# Patient Record
Sex: Male | Born: 1950 | Race: White | Hispanic: No | Marital: Married | Smoking: Never smoker
Health system: Southern US, Community
[De-identification: ages and names within clinical notes are randomized; demographics above are authoritative.]

## PROBLEM LIST (undated history)

## (undated) DIAGNOSIS — R030 Elevated blood-pressure reading, without diagnosis of hypertension: Secondary | ICD-10-CM

## (undated) DIAGNOSIS — M1990 Unspecified osteoarthritis, unspecified site: Secondary | ICD-10-CM

## (undated) DIAGNOSIS — E782 Mixed hyperlipidemia: Secondary | ICD-10-CM

## (undated) DIAGNOSIS — E7800 Pure hypercholesterolemia, unspecified: Secondary | ICD-10-CM

## (undated) DIAGNOSIS — I10 Essential (primary) hypertension: Secondary | ICD-10-CM

## (undated) DIAGNOSIS — H9190 Unspecified hearing loss, unspecified ear: Secondary | ICD-10-CM

## (undated) DIAGNOSIS — C61 Malignant neoplasm of prostate: Secondary | ICD-10-CM

## (undated) DIAGNOSIS — K219 Gastro-esophageal reflux disease without esophagitis: Secondary | ICD-10-CM

## (undated) DIAGNOSIS — C801 Malignant (primary) neoplasm, unspecified: Secondary | ICD-10-CM

## (undated) HISTORY — PX: NO PAST SURGERIES: SHX2092

## (undated) HISTORY — DX: Unspecified hearing loss, unspecified ear: H91.90

## (undated) HISTORY — DX: Malignant neoplasm of prostate: C61

## (undated) HISTORY — DX: Elevated blood-pressure reading, without diagnosis of hypertension: R03.0

## (undated) HISTORY — DX: Pure hypercholesterolemia, unspecified: E78.00

## (undated) HISTORY — DX: Essential (primary) hypertension: I10

## (undated) HISTORY — DX: Mixed hyperlipidemia: E78.2

---

## 2006-07-26 ENCOUNTER — Ambulatory Visit: Payer: Self-pay | Admitting: Gastroenterology

## 2008-02-03 LAB — HM COLONOSCOPY: HM COLON: NORMAL

## 2012-03-26 ENCOUNTER — Observation Stay: Payer: Self-pay | Admitting: Internal Medicine

## 2012-03-26 LAB — BASIC METABOLIC PANEL
Anion Gap: 6 — ABNORMAL LOW (ref 7–16)
Co2: 28 mmol/L (ref 21–32)
EGFR (Non-African Amer.): >60
Sodium: 136 mmol/L (ref 136–145)

## 2012-03-26 LAB — CBC
HGB: 16.8 g/dL (ref 13.0–18.0)
MCH: 31.2 pg (ref 26.0–34.0)
RBC: 5.37 10*6/uL (ref 4.40–5.90)
RDW: 13.4 % (ref 11.5–14.5)

## 2012-03-26 LAB — TROPONIN I
Troponin-I: <0.02 ng/mL
Troponin-I: <0.02 ng/mL

## 2012-03-26 LAB — CK TOTAL AND CKMB (NOT AT ARMC)
CK, Total: 177 U/L (ref 35–232)
CK-MB: 2.8 ng/mL (ref 0.5–3.6)

## 2012-03-26 LAB — HEMOGLOBIN A1C: Hemoglobin A1C: 5.9 % (ref 4.2–6.3)

## 2012-03-27 LAB — COMPREHENSIVE METABOLIC PANEL
Albumin: 3.3 g/dL — ABNORMAL LOW (ref 3.4–5.0)
Alkaline Phosphatase: 82 U/L (ref 50–136)
Anion Gap: 4 — ABNORMAL LOW (ref 7–16)
BUN: 18 mg/dL (ref 7–18)
Bilirubin,Total: 0.4 mg/dL (ref 0.2–1.0)
Co2: 28 mmol/L (ref 21–32)
EGFR (Non-African Amer.): >60
Glucose: 112 mg/dL — ABNORMAL HIGH (ref 65–99)
Potassium: 3.8 mmol/L (ref 3.5–5.1)
SGOT(AST): 30 U/L (ref 15–37)
SGPT (ALT): 30 U/L (ref 12–78)
Sodium: 137 mmol/L (ref 136–145)
Total Protein: 6.7 g/dL (ref 6.4–8.2)

## 2012-03-27 LAB — LIPID PANEL
Cholesterol: 213 mg/dL — ABNORMAL HIGH (ref 0–200)
HDL Cholesterol: 33 mg/dL — ABNORMAL LOW (ref 40–60)
Triglycerides: 613 mg/dL — ABNORMAL HIGH (ref 0–200)

## 2012-03-27 LAB — CBC WITH DIFFERENTIAL/PLATELET
Basophil #: 0.1 10*3/uL (ref 0.0–0.1)
Basophil %: 1.4 %
Eosinophil #: 0.6 10*3/uL (ref 0.0–0.7)
Eosinophil %: 6.9 %
HCT: 49.2 % (ref 40.0–52.0)
HGB: 16.2 g/dL (ref 13.0–18.0)
Lymphocyte %: 34.9 %
MCHC: 33 g/dL (ref 32.0–36.0)
MCV: 93 fL (ref 80–100)
Monocyte #: 0.9 x10 3/mm (ref 0.2–1.0)
Monocyte %: 11.1 %
Neutrophil %: 45.7 %
Platelet: 292 10*3/uL (ref 150–440)
RBC: 5.3 10*6/uL (ref 4.40–5.90)

## 2012-03-27 LAB — TROPONIN I: Troponin-I: <0.02 ng/mL

## 2012-07-01 ENCOUNTER — Ambulatory Visit: Payer: Self-pay | Admitting: Emergency Medicine

## 2012-07-01 LAB — DOT URINE DIP
Glucose,UR: NEGATIVE mg/dL (ref 0–75)
Protein: NEGATIVE
Specific Gravity: 1.025 (ref 1.003–1.030)

## 2013-03-13 DIAGNOSIS — E7800 Pure hypercholesterolemia, unspecified: Secondary | ICD-10-CM

## 2013-03-13 DIAGNOSIS — I10 Essential (primary) hypertension: Secondary | ICD-10-CM

## 2013-03-13 DIAGNOSIS — H9190 Unspecified hearing loss, unspecified ear: Secondary | ICD-10-CM | POA: Insufficient documentation

## 2013-03-13 HISTORY — DX: Unspecified hearing loss, unspecified ear: H91.90

## 2013-03-13 HISTORY — DX: Essential (primary) hypertension: I10

## 2013-03-13 HISTORY — DX: Pure hypercholesterolemia, unspecified: E78.00

## 2014-01-27 LAB — BASIC METABOLIC PANEL
BUN: 17 mg/dL (ref 4–21)
Creatinine: 0.9 mg/dL (ref <=1.3)
GLUCOSE: 109 mg/dL

## 2014-01-27 LAB — LIPID PANEL
CHOLESTEROL: 176 mg/dL (ref 0–200)
HDL: 35 mg/dL (ref 35–70)
LDL CALC: 78 mg/dL
Triglycerides: 316 mg/dL — AB (ref 40–160)

## 2014-01-27 LAB — FECAL OCCULT BLOOD, GUAIAC: FECAL OCCULT BLD: NEGATIVE

## 2014-01-27 LAB — PSA: PSA: 4.9

## 2014-04-24 NOTE — H&P (Signed)
PATIENT NAME:  Patrick Pena, Patrick Pena MR#:  [id number] DATE OF BIRTH:  04/28/1950  DATE OF ADMISSION:  03/26/2012  PRIMARY CARE PHYSICIAN: Otilio Miu, MD     HISTORY OF PRESENT ILLNESS: The patient is a 64 year old Caucasian male with past medical history significant for history of hyperlipidemia, diet controlled, who presented to the hospital with complaints of chest pains.  According to the patient, he was doing well up until approximately 10:00 a.m. when he was working in the garage, not a significantly strenuous job.  He suddenly started having left lower chest area pain close to the left axillary area which was radiating to the left arm.  The pain was described as 5 out of 10 by intensity, constant pain, lasted approximately 4  hours, accompanied with some headaches. The patient felt somewhat a little dizzy as well as nausea but denied any significant shortness of breath or feeling presyncopal. He never had such kind of pains in the past, and he decided to come to the Emergency Room for further evaluation. In the Emergency Room, his EKG was found to be abnormal with Q waves in V2 as well as T depressions in lateral leads, and hospitalist services were contacted for admission for patient with unstable angina.   PAST MEDICAL HISTORY: Significant for history of hyperlipidemia, diet-controlled.   MEDICATIONS: None.   PAST SURGICAL HISTORY: None.   ALLERGIES: None.   FAMILY HISTORY: Hypertension in the patient's father, who had CVA at the age of 90. No CVA or coronary artery disease at an early age.  No diabetes or cancers in the family.  The patient's mother had dementia.   SOCIAL HISTORY: The patient is married, has 2 children who live close by. No smoking.  Drinks alcohol, bourbon, 10 to 12 ounces a week.   REVIEW OF SYSTEMS: Positive for pains in the chest, some snoring, actually significant snoring as the patient's family reports significant snoring as well as stopping breathing at nighttime as well  as sleepiness in daytime. Intermittent cough with no sputum production. Nausea earlier today. Otherwise:  CONSTITUTIONAL: Denies any fevers, chills, fatigue, weakness, weight loss or gain.  EYES: Denies any blurry vision or double vision, glaucoma or cataracts.  ENT: Denies any tinnitus, allergies, epistaxis, sinus pain, dentures, difficulty swallowing.  RESPIRATORY: Denies any wheezes, asthma or COPD.  CARDIOVASCULAR: Denies any orthopnea, edema, arrhythmias, palpitations or syncope. GASTROINTESTINAL: Denies any vomiting, diarrhea or constipation.  GENITOURINARY: Denies any dysuria, hematuria, frequency or incontinence. ENDOCRINOLOGY: Denies any polydipsia, nocturia, thyroid problems, heat or cold intolerance or thirst.  HEMATOLOGIC: Denies anemia, easy bruising, bleeding or swollen glands.  SKIN: Denies any acne, rashes, lesions or change in moles.  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  Otherwise, no cramps, swelling or gout.  NEUROLOGIC: Denies numbness, epilepsy or tremor.  PSYCHIATRIC: Denies anxiety, insomnia, or depression.    PHYSICAL EXAMINATION:  VITAL SIGNS: On arrival to the hospital, temperature was 98.1, pulse was 74, respiratory rate was 20, blood pressure 178/84, saturation 98% on room air.  GENERAL:  This is a well-developed, well-nourished Caucasian male in no significant distress lying on the stretcher.  He is somewhat flushed in his face but otherwise comfortable on the stretcher.   HEENT: His pupils are equal and reactive to light. Extraocular movements are intact. No icterus or conjunctivitis.  Has normal hearing.  No pharyngeal erythema. Mucosa is moist.  NECK: No masses, supple, nontender. Thyroid is not  enlarged. No adenopathy. No JVD or carotid bruits bilaterally.  Full range of motion.  LUNGS: Clear to auscultation in all fields. Somewhat diminished breath sounds but no wheezing, no rales or  labored inspirations or increased effort to breath.  No dullness to percussion.  Not in overt respiratory distress.  CARDIOVASCULAR: S1, S2 appreciated. No murmurs, gallops or rubs noted. Rhythm and rate is regular.  PMI is not lateralized.  Chest is nontender to palpation, 1+ pedal pulses.  No lower extremity edema. No calf tenderness or cyanosis noted.  ABDOMEN:  Soft, nontender.  Bowel sounds are present.  No hepatosplenomegaly or masses were noted.  MUSCULOSKELETAL:  Muscle strength: Able to move all extremities. No cyanosis, degenerative joint disease or kyphosis. Gait is not tested.   SKIN: Skin did not reveal any rashes, lesions, erythema, nodularity, induration. It was warm and dry to palpation.  LYMPH: No adenopathy in the cervical region.  NEUROLOGICAL: Cranial nerves grossly intact. Sensory is intact. No dysarthria or aphasia. The patient is alert and oriented to time, person and place, cooperative. Memory is good.  PSYCHIATRIC: No significant confusion, agitation or depression noted.   LABORATORY, DIAGNOSTIC AND RADIOLOGICAL DATA:  BMP showed glucose of 106, otherwise unremarkable. Troponin is less than 0.2. CBC within normal limits with white blood cell count 9.3, hemoglobin 16.8, platelet count 325.   EKG showed normal sinus rhythm at 72 beats per minute, normal axis. Septal infarct, age undetermined, with Q waves in V2.  Otherwise, good R wave progression.  T depressions in inferior leads were also noted.  No EKG to compare with.   Chest, PA and lateral on 03/26/2012, revealed no acute cardiopulmonary disease. Some prominence in the right superior mediastinum was felt to be secondary to tortuous great vessels and patient positioning.  However, follow-up PA and lateral x-rays were recommended to ensure stability. CT scan of head without contrast due to left facial numbness on 03/26/2012 revealed no evidence of acute intracranial abnormalities. Findings which may represent a component of  mild sinus disease were noted.   ASSESSMENT AND PLAN: 1. Unstable angina:  Admit the patient to the medical floor.  We will start the patient on full anticoagulation with Lovenox. We will continue metoprolol, as well as nitroglycerin, as well as aspirin.  We will get Myoview stress test in the morning.  2. Malignant hypertension:  Continue the patient on nitroglycerin as well as metoprolol while in the hospital and also lisinopril.  3. Hyperlipidemia: Get lipid panel in the morning.  4. Hyperglycemia: Check hemoglobin A1c.  5. Suspected obstructive sleep apnea: The patient needs a sleep study to be done as outpatient.  6. Questionable transient ischemic attack as apparently the patient was complaining of some left facial weakness, headaches as well as left facial numbness.  We will continue the patient on aspirin therapy and will get a carotid ultrasound as well as echocardiogram done.        TIME SPENT: 1 hour.    ____________________________ Theodoro Grist, MD rv:cb D: 03/26/2012 17:18:49 ET T: 03/26/2012 17:44:12 ET JOB#: [id number]  cc: Theodoro Grist, MD, <Dictator> Juline Patch, MD [place] MD ELECTRONICALLY SIGNED 04/04/2012 15:13

## 2014-04-24 NOTE — Discharge Summary (Signed)
PATIENT NAME:  Patrick Pena, Patrick Pena MR#:  [id number] DATE OF BIRTH:  1950-05-03  DATE OF ADMISSION:  03/26/2012 DATE OF DISCHARGE:  03/27/2012  PRIMARY CARE PHYSICIAN: Dr. Otilio Miu  DISCHARGE DIAGNOSES: 1.  Chest pain, no acute coronary syndrome.    2.  Hypertension malignancy.  3.  Hyperlipidemia.  CODE STATUS: Full code.   CONDITION: Stable.   HOME MEDICATIONS: Vitamine 1 tablet p.o. daily, lisinopril 5 mg p.o. daily, Lopressor 25 mg p.o. b.i.d. aspirin 81 mg p.o. daily, atorvastatin 10 mg p.o. at bedtime.   DIET: Low sodium.   ACTIVITY: As tolerated.  FOLLOW-UP CARE: Follow up with PCP within 1 to 2 weeks.   REASON FOR ADMISSION: Chest pain.   HOSPITAL COURSE: The patient is a 64 year old Caucasian male with a history of hyperlipidemia, who presented to the ED with chest pain on the left side 5 out of 10, constant, lasted about 4 hours, accompanied with some headache. The patient's blood pressure was high  in ED at 178/84. For detailed history and physical examination, please refer to the admission note dictated by Dr. Theodoro Grist. The patient was admitted for unstable angina. After admission, the patient has been treated with Lovenox 1 mg/kg q. 12 hours and in addition, the patient has been treated with Lopressor, lisinopril, aspirin and nitroglycerin. Stress test was done today, which was negative. The patient's blood pressure has been controlled with hypertension medication. The patient's lipid panel shows hyperlipidemia, which has been treated with Lipitor. The patient denies any facial numbness anytime. He has no sign of her transient ischemic attack. The patient has no complaints after admission. He is clinically stable and will be discharged home today. The patient's discharge plan discussed with the patient, patient's wife and case Freight forwarder.   TIME SPENT: About 33 minutes.    ____________________________ Demetrios Loll, MD qc:cc D: 03/27/2012 18:07:55 ET T: 03/27/2012 18:39:35  ET JOB#: [id number]  cc: Demetrios Loll, MD, <Dictator> Demetrios Loll MD ELECTRONICALLY SIGNED 03/28/2012 15:29

## 2014-05-12 DIAGNOSIS — IMO0001 Reserved for inherently not codable concepts without codable children: Secondary | ICD-10-CM

## 2014-05-12 DIAGNOSIS — E782 Mixed hyperlipidemia: Secondary | ICD-10-CM | POA: Insufficient documentation

## 2014-05-12 DIAGNOSIS — R030 Elevated blood-pressure reading, without diagnosis of hypertension: Secondary | ICD-10-CM

## 2014-05-12 HISTORY — DX: Mixed hyperlipidemia: E78.2

## 2014-05-12 HISTORY — DX: Reserved for inherently not codable concepts without codable children: IMO0001

## 2014-05-22 ENCOUNTER — Ambulatory Visit
Admission: EM | Admit: 2014-05-22 | Discharge: 2014-05-22 | Disposition: A | Discharge status: Home / Self Care | Payer: BC Managed Care – PPO | Attending: Family Medicine | Admitting: Family Medicine

## 2014-05-22 ENCOUNTER — Encounter: Payer: Self-pay | Admitting: Emergency Medicine

## 2014-05-22 DIAGNOSIS — Z0289 Encounter for other administrative examinations: Secondary | ICD-10-CM

## 2014-05-22 DIAGNOSIS — Z029 Encounter for administrative examinations, unspecified: Secondary | ICD-10-CM

## 2014-05-22 HISTORY — DX: Pure hypercholesterolemia, unspecified: E78.00

## 2014-05-22 HISTORY — DX: Essential (primary) hypertension: I10

## 2014-05-22 LAB — DEPT OF TRANSP DIPSTICK, URINE (ARMC ONLY)
GLUCOSE, UA: NEGATIVE mg/dL
Hgb urine dipstick: NEGATIVE
Protein, ur: NEGATIVE mg/dL
SPECIFIC GRAVITY, URINE: 1.025 (ref 1.005–1.030)

## 2014-05-22 NOTE — ED Provider Notes (Signed)
CSN: [id number]     Arrival date & time 05/22/14  0706 History   First MD Initiated Contact with Patient 05/22/14 [phone]     Chief Complaint  Patient presents with  . DOT Physical    HPI Comments: Patient here for DOT Physical (see scanned form)  The history is provided by the patient.     Past Medical History  Diagnosis Date  . Hypertension   . Hypercholesteremia    History reviewed. No pertinent past surgical history. History reviewed. No pertinent family history. History  Substance Use Topics  . Smoking status: Never Smoker   . Smokeless tobacco: Never Used  . Alcohol Use: Yes    Review of Systems  Allergies  Review of patient's allergies indicates no known allergies.  Home Medications   Prior to Admission medications   Medication Sig Start Date End Date Taking? Authorizing Provider  aspirin 81 MG tablet Take 1 tablet by mouth daily. 03/28/12   Historical Provider, MD  atorvastatin (LIPITOR) 10 MG tablet Take 1 tablet by mouth daily. 01/28/14   Historical Provider, MD  lisinopril (PRINIVIL,ZESTRIL) 5 MG tablet Take 1 tablet by mouth daily. 01/27/14   Historical Provider, MD  metoprolol tartrate (LOPRESSOR) 25 MG tablet Take 0.5 tablets by mouth 2 (two) times daily. 01/27/14   Historical Provider, MD   BP 122/70 mmHg  Pulse 60  Temp(Src) 97 F (36.1 C) (Tympanic)  Resp 16  Ht 5' 8.5" (1.74 m)  Wt 184 lb (83.462 kg)  BMI 27.57 kg/m2  SpO2 98% Physical Exam  ED Course  Procedures (including critical care time) Labs Review Labs Reviewed  DEPT OF TRANSP DIPSTICK, URINE([facility])     Imaging Review No results found.   MDM   1. Encounter for examination required by Department of Transportation (DOT)      DOT Physical (see scanned form; qualified one year)  Norval Gable, MD 05/22/14 0830

## 2014-05-22 NOTE — ED Notes (Signed)
Patient here for DOT Physical.  

## 2014-07-28 ENCOUNTER — Ambulatory Visit: Payer: Self-pay | Admitting: Family Medicine

## 2014-11-04 ENCOUNTER — Other Ambulatory Visit: Payer: Self-pay | Admitting: Family Medicine

## 2014-11-19 ENCOUNTER — Ambulatory Visit (INDEPENDENT_AMBULATORY_CARE_PROVIDER_SITE_OTHER): Payer: BC Managed Care – PPO | Admitting: Urology

## 2014-11-19 VITALS — BP 119/73 | HR 60 | Ht 68.5 in | Wt 182.5 lb

## 2014-11-19 DIAGNOSIS — R972 Elevated prostate specific antigen [PSA]: Secondary | ICD-10-CM

## 2014-11-19 NOTE — Progress Notes (Signed)
11/19/2014 8:55 AM   Patrick Pena 1950-05-13 [id number]  Referring provider: Sharyne Peach, MD [place], [place] [address]  Chief Complaint  Patient presents with  . Elevated PSA    New Patient    HPI: The patient is a 64 year old healthy male with no GU history presents for elevated PSA. He had a PSA of 4.9 in January 2016 and 5.54 in August 2016. He has no urologic complaints at this time. His I PSS scores 8/2. He does have nocturia 2 does not find this bothersome. He is able to obtain an erection suitable for intercourse until completion.    PMH: Past Medical History  Diagnosis Date  . Hypertension   . Hypercholesteremia   . BP (high blood pressure) 03/13/2013  . Blood pressure elevated 05/12/2014  . 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 Hypercholesterolemia 03/13/2013    Surgical History: Past Surgical History  Procedure Laterality Date  . No past surgeries      Home Medications:    Medication List       This list is accurate as of: 11/19/14  8:55 AM.  Always use your most recent med list.               aspirin 81 MG tablet  Take 1 tablet by mouth daily.     lisinopril 5 MG tablet  Commonly known as:  PRINIVIL,ZESTRIL  TAKE 1 TABLET BY MOUTH DAILY     metoprolol tartrate 25 MG tablet  Commonly known as:  LOPRESSOR  TAKE 1/2 TABLET BY MOUTH TWICE A DAY     ranitidine 300 MG capsule  Commonly known as:  ZANTAC  Take 300 mg by mouth every evening.     ranitidine 300 MG tablet  Commonly known as:  ZANTAC  TAKE 1 TABLET (300 MG TOTAL) BY MOUTH NIGHTLY.        Allergies:  Allergies  Allergen Reactions  . Atorvastatin     Other reaction(s): Headache, Muscle Pain    Family History: Family History  Problem Relation Age of Onset  . Prostate cancer Neg Hx   . Kidney cancer Neg Hx   . Tuberculosis Neg Hx     Social History:  reports that he has never  smoked. He has never used smokeless tobacco. He reports that he drinks alcohol. He reports that he does not use illicit drugs.  ROS: UROLOGY Frequent Urination?: No Hard to postpone urination?: No Burning/pain with urination?: No Get up at night to urinate?: Yes Leakage of urine?: No Urine stream starts and stops?: No Trouble starting stream?: No Do you have to strain to urinate?: No Blood in urine?: No Urinary tract infection?: No Sexually transmitted disease?: No Injury to kidneys or bladder?: No Painful intercourse?: No Weak stream?: Yes Erection problems?: Yes Penile pain?: No  Gastrointestinal Nausea?: No Vomiting?: No Indigestion/heartburn?: No Diarrhea?: No Constipation?: No  Constitutional Fever: No Night sweats?: No Weight loss?: No Fatigue?: No  Skin Skin rash/lesions?: No Itching?: No  Eyes Blurred vision?: No Double vision?: No  Ears/Nose/Throat Sore throat?: No Sinus problems?: No  Hematologic/Lymphatic Swollen glands?: No Easy bruising?: No  Cardiovascular Leg swelling?: No Chest pain?: No  Respiratory Cough?: No Shortness of breath?: No  Endocrine Excessive thirst?: No  Musculoskeletal Back pain?: No Joint pain?: No  Neurological Headaches?: No Dizziness?: No  Psychologic Depression?: No Anxiety?: No  Physical Exam: BP 119/73 mmHg  Pulse 60  Ht 5' 8.5" (1.74 m)  Wt 182 lb 8 oz (82.781 kg)  BMI 27.34 kg/m2  Constitutional:  Alert and oriented, No acute distress. HEENT: [place] AT, moist mucus membranes.  Trachea midline, no masses. Cardiovascular: No clubbing, cyanosis, or edema. Respiratory: Normal respiratory effort, no increased work of breathing. GI: Abdomen is soft, nontender, nondistended, no abdominal masses GU: No CVA tenderness. Normal phallus. Testicles descended equally bilaterally. DRE: 2+, smooth with induration of the right side. Skin: No rashes, bruises or suspicious lesions. Lymph: No cervical or inguinal  adenopathy. Neurologic: Grossly intact, no focal deficits, moving all 4 extremities. Psychiatric: Normal mood and affect.  Laboratory Data: Lab Results  Component Value Date   WBC 8.5 03/27/2012   HGB 16.2 03/27/2012   HCT 49.2 03/27/2012   MCV 93 03/27/2012   PLT 292 03/27/2012    Lab Results  Component Value Date   CREATININE 0.9 01/27/2014    Lab Results  Component Value Date   PSA 4.9 01/27/2014    No results found for: TESTOSTERONE  Lab Results  Component Value Date   HGBA1C 5.9 03/26/2012    Urinalysis    Component Value Date/Time   LABSPEC 1.025 05/22/2014 0746   LABSPEC 1.025 07/01/2012 1444   GLUCOSEU NEGATIVE 05/22/2014 0746   GLUCOSEU NEGATIVE 07/01/2012 1444   HGBUR NEGATIVE 05/22/2014 0746   HGBUR NEGATIVE 07/01/2012 1444   PROTEINUR NEGATIVE 05/22/2014 0746   PROTEINUR NEGATIVE 07/01/2012 1444      Assessment & Plan:     We reviewed the implications of an elevated PSA and the uncertainty surrounding it. In general, a man's PSA increases with age and is produced by both normal and cancerous prostate tissue. Differential for elevated PSA is BPH, prostate cancer, infection, recent intercourse/ejaculation, prostate infarction, recent urethroscopic manipulation (foley placement/cystoscopy) and prostatitis. Management of an elevated PSA can include observation or prostate biopsy and we discussed this in detail. We discussed that indications for prostate biopsy are defined by age and race specific PSA cutoffs as well as a PSA velocity of 0.75/year.  I also discussed with the patient that he is at an increased risk for prostate cancer due to his abnormal digital rectal exam.   We discussed prostate biopsy in detail including the procedure itself, the risks of blood in the urine, stool, and ejaculate, serious infection, and discomfort. He is willing to proceed with this as discussed.   1. Elevated PSA with abnormal DRE -return for prostate biopsy  Return  in about 2 weeks (around 12/03/2014) for prostate biopsy.  Nickie Retort, MD  [facility] [address], [place], [place] [address] [phone]

## 2014-11-20 LAB — PSA: PROSTATE SPECIFIC AG, SERUM: 5.4 ng/mL — AB (ref 0.0–4.0)

## 2014-12-17 ENCOUNTER — Ambulatory Visit (INDEPENDENT_AMBULATORY_CARE_PROVIDER_SITE_OTHER): Payer: BC Managed Care – PPO | Admitting: Urology

## 2014-12-17 ENCOUNTER — Encounter: Payer: Self-pay | Admitting: Urology

## 2014-12-17 ENCOUNTER — Other Ambulatory Visit: Payer: Self-pay | Admitting: Urology

## 2014-12-17 VITALS — BP 123/77 | HR 57 | Ht 68.5 in | Wt 184.0 lb

## 2014-12-17 DIAGNOSIS — R972 Elevated prostate specific antigen [PSA]: Secondary | ICD-10-CM

## 2014-12-17 MED ORDER — GENTAMICIN SULFATE 40 MG/ML IJ SOLN
80.0000 mg | Freq: Once | INTRAMUSCULAR | Status: AC
  Administered 2014-12-17: 80 mg via INTRAMUSCULAR

## 2014-12-17 MED ORDER — LIDOCAINE HCL 2 % EX GEL
1.0000 "application " | Freq: Once | CUTANEOUS | Status: AC
  Administered 2014-12-17: 1 via URETHRAL

## 2014-12-17 MED ORDER — LEVOFLOXACIN 500 MG PO TABS
500.0000 mg | ORAL_TABLET | Freq: Once | ORAL | Status: AC
  Administered 2014-12-17: 500 mg via ORAL

## 2014-12-17 NOTE — Progress Notes (Signed)
Prostate Biopsy Procedure   Informed consent was obtained after discussing risks/benefits of the procedure.  A time out was performed to ensure correct patient identity.  Pre-Procedure: - Last PSA Level:  Lab Results  Component Value Date   PSA 5.4* 11/19/2014   PSA 4.9 01/27/2014   - Gentamicin given prophylactically - Levaquin 500 mg administered PO/Gentamycin 80 mg IM -Transrectal Ultrasound performed revealing a 59.09 gm prostate -No significant hypoechoic or median lobe noted  Procedure: - Prostate block performed using 10 cc 1% lidocaine and biopsies taken from sextant areas, a total of 12 under ultrasound guidance.  Post-Procedure: - Patient tolerated the procedure well - He was counseled to seek immediate medical attention if experiences any severe pain, significant bleeding, or fevers - Return in one week to discuss biopsy results

## 2014-12-22 LAB — PATHOLOGY REPORT

## 2014-12-24 ENCOUNTER — Ambulatory Visit (INDEPENDENT_AMBULATORY_CARE_PROVIDER_SITE_OTHER): Payer: BC Managed Care – PPO | Admitting: Urology

## 2014-12-24 ENCOUNTER — Encounter: Payer: Self-pay | Admitting: Urology

## 2014-12-24 ENCOUNTER — Ambulatory Visit: Payer: BC Managed Care – PPO

## 2014-12-24 VITALS — BP 130/72 | HR 61 | Ht 69.0 in | Wt 185.0 lb

## 2014-12-24 DIAGNOSIS — C61 Malignant neoplasm of prostate: Secondary | ICD-10-CM

## 2014-12-24 NOTE — Progress Notes (Signed)
12/24/2014 11:38 AM   Patrick Pena May 03, 1950 [id number]  Referring provider: Juline Patch, MD [address] [place], [place] [address]  Chief Complaint  Patient presents with  . Results    Biopsy    HPI: The patient is a 64 year old healthy male with no GU history presents for elevated PSA. He had a PSA of 4.9 in January 2016 and 5.54 in August 2016. He has no urologic complaints at this time. His I PSS scores 8/2. He does have nocturia 2 but does not find this bothersome. He is able to obtain an erection suitable for intercourse until completion.   Interval history: The patient follows up for his biopsy results. Unfortunately, he had Gleason 4+4 = 8 in 13% of the core from his left lateral base. He also had multiple cores bilaterally Gleason 3+3 = 6 prostate cancer. These were athe right medial base (50%), left medial mid (41%), and right medial apex (5%).  PMH: Past Medical History  Diagnosis Date  . Hypertension   . Hypercholesteremia   . BP (high blood pressure) 03/13/2013  . Blood pressure elevated 05/12/2014  . 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 Hypercholesterolemia 03/13/2013    Surgical History: Past Surgical History  Procedure Laterality Date  . No past surgeries      Home Medications:    Medication List       This list is accurate as of: 12/24/14 11:38 AM.  Always use your most recent med list.               aspirin 81 MG tablet  Take 1 tablet by mouth daily. Reported on 12/24/2014     lisinopril 5 MG tablet  Commonly known as:  PRINIVIL,ZESTRIL  TAKE 1 TABLET BY MOUTH DAILY     metoprolol tartrate 25 MG tablet  Commonly known as:  LOPRESSOR  TAKE 1/2 TABLET BY MOUTH TWICE A DAY     multivitamin tablet  Take 1 tablet by mouth daily.     ranitidine 300 MG capsule  Commonly known as:  ZANTAC  Take 300 mg by mouth every evening.     ranitidine 300 MG tablet    Commonly known as:  ZANTAC  Reported on 12/24/2014        Allergies:  Allergies  Allergen Reactions  . Atorvastatin     Other reaction(s): Headache, Muscle Pain Other reaction(s): Headache, Muscle Pain    Family History: Family History  Problem Relation Age of Onset  . Prostate cancer Neg Hx   . Kidney cancer Neg Hx   . Tuberculosis Neg Hx     Social History:  reports that he has never smoked. He has never used smokeless tobacco. He reports that he drinks alcohol. He reports that he does not use illicit drugs.  ROS:                                        Physical Exam: BP 130/72 mmHg  Pulse 61  Ht 5\' 9"  (1.753 m)  Wt 185 lb (83.915 kg)  BMI 27.31 kg/m2  Constitutional:  Alert and oriented, No acute distress. HEENT:  AT, moist mucus membranes.  Trachea midline, no masses. Cardiovascular: No clubbing, cyanosis, or edema. Respiratory: Normal respiratory effort, no increased work of breathing. GI: Abdomen is soft, nontender, nondistended, no  abdominal masses GU: No CVA tenderness.  Skin: No rashes, bruises or suspicious lesions. Lymph: No cervical or inguinal adenopathy. Neurologic: Grossly intact, no focal deficits, moving all 4 extremities. Psychiatric: Normal mood and affect.  Laboratory Data: Lab Results  Component Value Date   WBC 8.5 03/27/2012   HGB 16.2 03/27/2012   HCT 49.2 03/27/2012   MCV 93 03/27/2012   PLT 292 03/27/2012    Lab Results  Component Value Date   CREATININE 0.9 01/27/2014    Lab Results  Component Value Date   PSA 5.4* 11/19/2014   PSA 4.9 01/27/2014    No results found for: TESTOSTERONE  Lab Results  Component Value Date   HGBA1C 5.9 03/26/2012    Urinalysis    Component Value Date/Time   LABSPEC 1.025 05/22/2014 0746   LABSPEC 1.025 07/01/2012 1444   GLUCOSEU NEGATIVE 05/22/2014 0746   GLUCOSEU NEGATIVE 07/01/2012 1444   HGBUR NEGATIVE 05/22/2014 0746   HGBUR NEGATIVE 07/01/2012 1444    PROTEINUR NEGATIVE 05/22/2014 0746   PROTEINUR NEGATIVE 07/01/2012 1444      Assessment & Plan:     I discussed with the patient his high risk prostate cancer due to his core of gleason 4+4=8 disease.  We discussed the treatment options for prostate cancer which include watchful waiting, active surveillance, robotic prostatectomy, radiation therapy, and cryotherapy. I do not think he is a good candidate for watchful waiting, active surveillance, cryotherapy due to his high risk disease. We discussed the robotic prostatectomy in great detail including the need for Foley catheterization after surgery. He is also the risk of incontinence and impotence following surgery. We discussed that he would need to refrain from heavy lifting of anything weighing greater than a gallon of milk for 4-6 weeks after surgery. We also discussed radiation therapy and some of the side effects of this treatment option. I think he would best served given his Gleason 8 prostate cancer with the robotic prostatectomy.  He was offered a consultation with radiation oncology if he so desires. He will think about his options at this time. He was given the 100 questions about prostate cancer book. He will follow-up after high-risk imaging studies are obtained.  1. 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    Return in about 2 weeks (around 01/07/2015) for after imaging studies.  Nickie Retort, MD  [facility] [address] [place], [place] [address] [phone]

## 2014-12-25 LAB — BASIC METABOLIC PANEL
BUN/Creatinine Ratio: 23 — ABNORMAL HIGH (ref 10–22)
BUN: 18 mg/dL (ref 8–27)
CHLORIDE: 96 mmol/L (ref 96–106)
CO2: 23 mmol/L (ref 18–29)
Calcium: 9.8 mg/dL (ref 8.6–10.2)
Creatinine, Ser: 0.8 mg/dL (ref 0.76–1.27)
GFR calc Af Amer: 109 mL/min/{1.73_m2} (ref >59)
GFR calc non Af Amer: 94 mL/min/{1.73_m2} (ref >59)
GLUCOSE: 103 mg/dL — AB (ref 65–99)
Potassium: 4.9 mmol/L (ref 3.5–5.2)
SODIUM: 139 mmol/L (ref 134–144)

## 2014-12-31 DIAGNOSIS — C61 Malignant neoplasm of prostate: Secondary | ICD-10-CM | POA: Insufficient documentation

## 2015-01-05 ENCOUNTER — Encounter
Admission: RE | Admit: 2015-01-05 | Discharge: 2015-01-05 | Disposition: A | Discharge status: Home / Self Care | Payer: BC Managed Care – PPO | Source: Ambulatory Visit | Attending: Urology | Admitting: Urology

## 2015-01-05 ENCOUNTER — Ambulatory Visit
Admission: RE | Admit: 2015-01-05 | Discharge: 2015-01-05 | Disposition: A | Discharge status: Home / Self Care | Payer: BC Managed Care – PPO | Source: Ambulatory Visit | Attending: Urology | Admitting: Urology

## 2015-01-05 ENCOUNTER — Encounter: Admission: RE | Admit: 2015-01-05 | Payer: BC Managed Care – PPO | Source: Ambulatory Visit

## 2015-01-05 DIAGNOSIS — K4020 Bilateral inguinal hernia, without obstruction or gangrene, not specified as recurrent: Secondary | ICD-10-CM | POA: Diagnosis not present

## 2015-01-05 DIAGNOSIS — C61 Malignant neoplasm of prostate: Secondary | ICD-10-CM | POA: Diagnosis present

## 2015-01-05 MED ORDER — TECHNETIUM TC 99M MEDRONATE IV KIT
25.0000 | PACK | Freq: Once | INTRAVENOUS | Status: AC | PRN
  Administered 2015-01-05: 20.365 via INTRAVENOUS

## 2015-01-05 MED ORDER — IOHEXOL 300 MG/ML  SOLN
100.0000 mL | Freq: Once | INTRAMUSCULAR | Status: AC | PRN
  Administered 2015-01-05: 100 mL via INTRAVENOUS

## 2015-01-07 ENCOUNTER — Other Ambulatory Visit: Payer: Self-pay | Admitting: Urology

## 2015-01-07 ENCOUNTER — Encounter: Payer: Self-pay | Admitting: Urology

## 2015-01-07 ENCOUNTER — Ambulatory Visit (INDEPENDENT_AMBULATORY_CARE_PROVIDER_SITE_OTHER): Payer: BC Managed Care – PPO | Admitting: Urology

## 2015-01-07 VITALS — BP 122/71 | HR 52 | Ht 68.0 in | Wt 185.4 lb

## 2015-01-07 DIAGNOSIS — C61 Malignant neoplasm of prostate: Secondary | ICD-10-CM

## 2015-01-07 NOTE — Progress Notes (Signed)
01/07/2015 11:26 AM   Patrick Pena [birth year] [id number]  Referring provider: Sharyne Peach, MD [place], [place] [address]  Chief Complaint  Patient presents with  . Follow-up    prostate cancer     HPI: The patient is a 65 year old healthy male with no GU history presents for elevated PSA. He had a PSA of 4.9 in January 2016 and 5.54 in August 2016. He has no urologic complaints at this time. His I PSS scores 8/2. He does have nocturia 2 but does not find this bothersome. He is able to obtain an erection suitable for intercourse until completion.   The patient underwent a prostate biopsy showing Gleason 4+4 = 8 in 13% of the core from his left lateral base. He also had multiple cores bilaterally Gleason 3+3 = 6 prostate cancer. These were athe right medial base (50%), left medial mid (41%), and right medial apex (5%).  CT scan was unremarkable except for bilateral inguinal hernias containing fat.  PMH: Past Medical History  Diagnosis Date  . Hypertension   . Hypercholesteremia   . BP (high blood pressure) 03/13/2013  . Blood pressure elevated 05/12/2014  . 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 Hypercholesterolemia 03/13/2013    Surgical History: Past Surgical History  Procedure Laterality Date  . No past surgeries      Home Medications:    Medication List       This list is accurate as of: 01/07/15 11:26 AM.  Always use your most recent med list.               aspirin 81 MG tablet  Take 1 tablet by mouth daily. Reported on 01/07/2015     lisinopril 5 MG tablet  Commonly known as:  PRINIVIL,ZESTRIL  TAKE 1 TABLET BY MOUTH DAILY     metoprolol tartrate 25 MG tablet  Commonly known as:  LOPRESSOR  TAKE 1/2 TABLET BY MOUTH TWICE A DAY     multivitamin tablet  Take 1 tablet by mouth daily.     ranitidine 300 MG capsule  Commonly known as:  ZANTAC  Take 300 mg by mouth  every evening.        Allergies:  Allergies  Allergen Reactions  . Atorvastatin     Other reaction(s): Headache, Muscle Pain Other reaction(s): Headache, Muscle Pain    Family History: Family History  Problem Relation Age of Onset  . Prostate cancer Neg Hx   . Kidney cancer Neg Hx   . Tuberculosis Neg Hx     Social History:  reports that he has never smoked. He has never used smokeless tobacco. He reports that he drinks alcohol. He reports that he does not use illicit drugs.  ROS: UROLOGY Frequent Urination?: No Hard to postpone urination?: Yes Burning/pain with urination?: No Get up at night to urinate?: Yes Leakage of urine?: No Urine stream starts and stops?: No Trouble starting stream?: No Do you have to strain to urinate?: No Blood in urine?: No Urinary tract infection?: No Sexually transmitted disease?: No Injury to kidneys or bladder?: No Painful intercourse?: No Weak stream?: No Erection problems?: No Penile pain?: No  Gastrointestinal Nausea?: No Vomiting?: No Indigestion/heartburn?: No Diarrhea?: No Constipation?: No  Constitutional Fever: No Night sweats?: No Weight loss?: No Fatigue?: No  Skin Skin rash/lesions?: No Itching?: No  Eyes Blurred vision?: No Double vision?: No  Ears/Nose/Throat Sore throat?: No Sinus  problems?: No  Hematologic/Lymphatic Swollen glands?: No Easy bruising?: No  Cardiovascular Leg swelling?: No Chest pain?: No  Respiratory Cough?: No Shortness of breath?: No  Endocrine Excessive thirst?: No  Musculoskeletal Back pain?: No Joint pain?: Yes  Neurological Headaches?: No Dizziness?: No  Psychologic Depression?: No Anxiety?: No  Physical Exam: BP 122/71 mmHg  Pulse 52  Ht 5\' 8"  (1.727 m)  Wt 185 lb 6.4 oz (84.097 kg)  BMI 28.20 kg/m2  Constitutional:  Alert and oriented, No acute distress. HEENT: [place] AT, moist mucus membranes.  Trachea midline, no masses. Cardiovascular: No clubbing,  cyanosis, or edema. Respiratory: Normal respiratory effort, no increased work of breathing. GI: Abdomen is soft, nontender, nondistended, no abdominal masses GU: No CVA tenderness.  Skin: No rashes, bruises or suspicious lesions. Lymph: No cervical or inguinal adenopathy. Neurologic: Grossly intact, no focal deficits, moving all 4 extremities. Psychiatric: Normal mood and affect.  Laboratory Data: Lab Results  Component Value Date   WBC 8.5 03/27/2012   HGB 16.2 03/27/2012   HCT 49.2 03/27/2012   MCV 93 03/27/2012   PLT 292 03/27/2012    Lab Results  Component Value Date   CREATININE 0.80 12/24/2014    Lab Results  Component Value Date   PSA 5.4* 11/19/2014   PSA 4.9 01/27/2014    No results found for: TESTOSTERONE  Lab Results  Component Value Date   HGBA1C 5.9 03/26/2012    Urinalysis    Component Value Date/Time   LABSPEC 1.025 05/22/2014 0746   LABSPEC 1.025 07/01/2012 1444   GLUCOSEU NEGATIVE 05/22/2014 0746   GLUCOSEU NEGATIVE 07/01/2012 1444   HGBUR NEGATIVE 05/22/2014 0746   HGBUR NEGATIVE 07/01/2012 1444   PROTEINUR NEGATIVE 05/22/2014 0746   PROTEINUR NEGATIVE 07/01/2012 1444    Pertinent Imaging:  CLINICAL DATA: Prostate cancer.  EXAM: CT PELVIS WITH CONTRAST  TECHNIQUE: Multidetector CT imaging of the pelvis was performed using the standard protocol following the bolus administration of intravenous contrast.  CONTRAST: 144mL OMNIPAQUE IOHEXOL 300 MG/ML SOLN  COMPARISON: None.  FINDINGS: Small bilateral inguinal hernias containing fat. Prostate is enlarged. Urinary bladder, visualized large and small bowel unremarkable. Visualized lower aorta and iliac vessels are normal caliber.  No acute or focal bony abnormality.  IMPRESSION: No acute findings in the pelvis.  Prostate enlargement.  Bilateral inguinal hernias containing fat.     Assessment & Plan:    1. T2aNxMx Gleason 4+4 = 8 high-risk prostate cancer -  clinically localized I had a long discussion with the patient for treatment options of his clinically localized prostate cancer. We focused on robotic prostatectomy and radiation therapy as treatment options. He understands the risks of prostatectomy include but are not limited to incontinence, impotence, bleeding, infection, iatrogenic injury to surrounding structures. Currently he is leaning towards surgical therapy. We will have him set up for pre-prostatectomy physical therapy. In the interim, I'll like for him to see our radiation oncologist to help the patient understand the treatment options that are available to him with radiation therapy. We will tenderly schedule his surgery at this time, however he chooses to undergo radiation therapy, he will call the office. He also understands that he will be with a Foley catheter for 7-10 days and a JP drain postoperatively.    Nickie Retort, MD  [facility] [address], [place], [place] [address] [phone]

## 2015-01-08 ENCOUNTER — Telehealth: Payer: Self-pay

## 2015-01-08 NOTE — Telephone Encounter (Signed)
  Oncology Nurse Navigator Documentation  Navigator Location: [facility] (01/08/15 1000) Navigator Encounter Type: Introductory phone call (01/08/15 1000)               Barriers/Navigation Needs: Coordination of Care (01/08/15 1000)   Interventions: Coordination of Care (01/08/15 1000)   Coordination of Care: Appts (01/08/15 1000)                  Time Spent with Patient: 30 (01/08/15 1000)   Spoke with spouse at the request of Mr Bulla. Appt arranged for Dr Baruch Gouty 01/13/15 at 10:00. At this time he is leaning toward surgery and is going for a second surgical opinion with Dr Alinda Money at [facility] in [place]. Introduced Therapist, nutritional. Answered all questions and provided [facility] as another resource.

## 2015-01-13 ENCOUNTER — Encounter: Payer: Self-pay | Admitting: Radiation Oncology

## 2015-01-13 ENCOUNTER — Ambulatory Visit
Admission: RE | Admit: 2015-01-13 | Discharge: 2015-01-13 | Disposition: A | Discharge status: Home / Self Care | Payer: BC Managed Care – PPO | Source: Ambulatory Visit | Attending: Radiation Oncology | Admitting: Radiation Oncology

## 2015-01-13 VITALS — BP 126/69 | HR 61 | Temp 96.7°F | Resp 20 | Wt 188.8 lb

## 2015-01-13 DIAGNOSIS — C61 Malignant neoplasm of prostate: Secondary | ICD-10-CM

## 2015-01-13 NOTE — Consult Note (Signed)
Except an outstanding is perfect of Radiation Oncology NEW PATIENT EVALUATION  Name: Patrick Pena  MRN: [id number]  Date:   01/13/2015     DOB: [birth year]   This 65 y.o. male patient presents to the clinic for initial evaluation of prostate cancer stage IIB (T1 cN0 M0) Gleason 8 (4+4) presenting the PSA in the 5 range.  REFERRING PHYSICIAN: Sharyne Peach, MD  CHIEF COMPLAINT:  Chief Complaint  Patient presents with  . Prostate Cancer    Pt is here for initial consultation of prostate cancer.    DIAGNOSIS: The encounter diagnosis was Malignant neoplasm of prostate ([place]).   PREVIOUS INVESTIGATIONS:  CT scan and bone scan reviewed Pathology report reviewed Clinical notes reviewed  HPI: Patient is a 65 year old male who presented with a slightly elevated PSA in January 2016 to 4.9. Declined to 5.4 in August 2016 underwent transrectal ultrasound-guided core biopsies showing Gleason 6 (3+3) in 5 of 12 core biopsies. One core biopsy did show a Gleason 8 (4+4). He is a symptomatically at this time specifically denies any lower urinary tract symptoms or diarrhea. Does have nocturia 2. He has no problems with erectile dysfunction. Treatment options have been discussed with the patient. He has several comorbidities including hypertension and hypercholesterolemia. Treatment options have been discussed with urology he is here today for radiation oncology opinion. Patient has had a bone scan and CT scan both negative for metastatic disease or extracapsular extension of his prostate cancer.  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    PAST SURGICAL HISTORY:  Past Surgical History   Procedure Laterality Date  . No past surgeries      FAMILY HISTORY: family history is negative for Prostate cancer, Kidney cancer, and Tuberculosis.  SOCIAL HISTORY:  reports that he has never smoked. He has never used smokeless tobacco. He reports that he drinks alcohol. He reports that he does not use illicit drugs.  ALLERGIES: Atorvastatin  MEDICATIONS:  Current Outpatient Prescriptions  Medication Sig Dispense Refill  . aspirin 81 MG tablet Take 1 tablet by mouth daily. Reported on 01/07/2015    . lisinopril (PRINIVIL,ZESTRIL) 5 MG tablet TAKE 1 TABLET BY MOUTH DAILY 30 tablet 0  . metoprolol tartrate (LOPRESSOR) 25 MG tablet TAKE 1/2 TABLET BY MOUTH TWICE A DAY 30 tablet 0  . Multiple Vitamin (MULTIVITAMIN) tablet Take 1 tablet by mouth daily.    . ranitidine (ZANTAC) 300 MG capsule Take 300 mg by mouth every evening.     No current facility-administered medications for this encounter.    ECOG PERFORMANCE STATUS:  0 - Asymptomatic  REVIEW OF SYSTEMS:  Patient denies any weight loss, fatigue, weakness, fever, chills or night sweats. Patient denies any loss of vision, blurred vision. Patient denies any ringing  of the ears or hearing loss. No irregular heartbeat. Patient denies heart murmur or history of fainting. Patient denies any chest pain or pain radiating to her upper extremities. Patient denies any shortness of breath, difficulty breathing at night, cough or hemoptysis. Patient denies any swelling in the lower legs. Patient denies any nausea vomiting, vomiting of blood, or coffee ground material in the vomitus. Patient denies any stomach pain. Patient states has had normal bowel movements no significant constipation or diarrhea. Patient denies any dysuria, hematuria or  significant nocturia. Patient denies any problems walking, swelling in the joints or loss of balance. Patient denies any skin changes, loss of hair or loss of weight. Patient denies any excessive worrying or anxiety  or significant depression. Patient denies any problems with insomnia. Patient denies excessive thirst, polyuria, polydipsia. Patient denies any swollen glands, patient denies easy bruising or easy bleeding. Patient denies any recent infections, allergies or URI. Patient "s visual fields have not changed significantly in recent time.    PHYSICAL EXAM: BP 126/69 mmHg  Pulse 61  Temp(Src) 96.7 F (35.9 C)  Resp 20  Wt 188 lb 13.2 oz (85.65 kg) On rectal exam rectal sphincter tone is good prostate is slightly asymptomatic symmetric with left lateral lobe slightly larger than the right although no discrete nodularity or mass is identified. Sulcus is preserved bilaterally. No other rectal abnormality is identified. Well-developed well-nourished patient in NAD. HEENT reveals PERLA, EOMI, discs not visualized.  Oral cavity is clear. No oral mucosal lesions are identified. Neck is clear without evidence of cervical or supraclavicular adenopathy. Lungs are clear to A&P. Cardiac examination is essentially unremarkable with regular rate and rhythm without murmur rub or thrill. Abdomen is benign with no organomegaly or masses noted. Motor sensory and DTR levels are equal and symmetric in the upper and lower extremities. Cranial nerves II through XII are grossly intact. Proprioception is intact. No peripheral adenopathy or edema is identified. No motor or sensory levels are noted. Crude visual fields are within normal range.  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 I've explained the risks and benefits of both surgery and radiation therapy. I've explained that there is the potential benefit of surgery in that if he has biochemical failure or positive margins we can  always salvage him with radiation therapy. I believe he is of excellent performance status would tolerate robotic prostatectomy well. I've also run in [facility] nomogram showing disease-free survival up to 98% with radical prostatectomy. He also has a low chance of pelvic lymph node involvement around 11% range. All of this was explained in detail to the patient and his wife. They are opting for surgery. I would be happy to reevaluate the patient at any time should further treatment be indicated.  I would like to take this opportunity for allowing me to participate in the care of your patient.Armstead Peaks., MD

## 2015-01-15 ENCOUNTER — Telehealth: Payer: Self-pay | Admitting: Radiology

## 2015-01-15 NOTE — Telephone Encounter (Signed)
Pt's wife notified of surgery scheduled 02/17/15, pre-admit testing appt on 2/2 @7 :30 and to call day prior to surgery for arrival to [place]. Wife voices understanding.

## 2015-01-19 ENCOUNTER — Other Ambulatory Visit (HOSPITAL_COMMUNITY): Payer: Self-pay | Admitting: Urology

## 2015-01-19 ENCOUNTER — Ambulatory Visit (HOSPITAL_COMMUNITY)
Admission: RE | Admit: 2015-01-19 | Discharge: 2015-01-19 | Disposition: A | Discharge status: Home / Self Care | Payer: BC Managed Care – PPO | Source: Ambulatory Visit | Attending: Urology | Admitting: Urology

## 2015-01-19 DIAGNOSIS — C61 Malignant neoplasm of prostate: Secondary | ICD-10-CM | POA: Diagnosis not present

## 2015-01-21 ENCOUNTER — Other Ambulatory Visit: Payer: Self-pay | Admitting: Urology

## 2015-01-21 ENCOUNTER — Ambulatory Visit: Payer: BC Managed Care – PPO | Admitting: Physical Therapy

## 2015-01-27 NOTE — Patient Instructions (Signed)
Patrick Pena  01/27/2015   Your procedure is scheduled on: 02/04/2015    Report to [facility] Main  Entrance take [place]  elevators to 3rd floor to  [place] at     [place] AM.  Call this number if you have problems the morning of surgery [phone]   Remember: ONLY 1 PERSON MAY GO WITH YOU TO SHORT STAY TO GET  READY MORNING OF YOUR SURGERY.  Do not eat food or drink liquids :After Midnight.     Take these medicines the morning of surgery with A SIP OF WATER: metoprolol ( Lopressor), Zantac                                 You may not have any metal on your body including hair pins and              piercings  Do not wear jewelry, , lotions, powders or perfumes, deodorant                          Men may shave face and neck.   Do not bring valuables to the hospital. [place].  Contacts, dentures or bridgework may not be worn into surgery.  Leave suitcase in the car. After surgery it may be brought to your room.        Special Instructions: coughing and deep breathing exercises, leg exercises               Please read over the following fact sheets you were given: _____________________________________________________________________             [facility] - Preparing for Surgery Before surgery, you can play an important role.  Because skin is not sterile, your skin needs to be as free of germs as possible.  You can reduce the number of germs on your skin by washing with CHG (chlorahexidine gluconate) soap before surgery.  CHG is an antiseptic cleaner which kills germs and bonds with the skin to continue killing germs even after washing. Please DO NOT use if you have an allergy to CHG or antibacterial soaps.  If your skin becomes reddened/irritated stop using the CHG and inform your nurse when you arrive at Short Stay. Do not shave (including legs and underarms) for at least 48 hours prior to the first  CHG shower.  You may shave your face/neck. Please follow these instructions carefully:  1.  Shower with CHG Soap the night before surgery and the  morning of Surgery.  2.  If you choose to wash your hair, wash your hair first as usual with your  normal  shampoo.  3.  After you shampoo, rinse your hair and body thoroughly to remove the  shampoo.                           4.  Use CHG as you would any other liquid soap.  You can apply chg directly  to the skin and wash                       Gently with a scrungie or clean  washcloth.  5.  Apply the CHG Soap to your body ONLY FROM THE NECK DOWN.   Do not use on face/ open                           Wound or open sores. Avoid contact with eyes, ears mouth and genitals (private parts).                       Wash face,  Genitals (private parts) with your normal soap.             6.  Wash thoroughly, paying special attention to the area where your surgery  will be performed.  7.  Thoroughly rinse your body with warm water from the neck down.  8.  DO NOT shower/wash with your normal soap after using and rinsing off  the CHG Soap.                9.  Pat yourself dry with a clean towel.            10.  Wear clean pajamas.            11.  Place clean sheets on your bed the night of your first shower and do not  sleep with pets. Day of Surgery : Do not apply any lotions/deodorants the morning of surgery.  Please wear clean clothes to the hospital/surgery center.  FAILURE TO FOLLOW THESE INSTRUCTIONS MAY RESULT IN THE CANCELLATION OF YOUR SURGERY PATIENT SIGNATURE_________________________________  NURSE SIGNATURE__________________________________  ________________________________________________________________________  WHAT IS A BLOOD TRANSFUSION? Blood Transfusion Information  A transfusion is the replacement of blood or some of its parts. Blood is made up of multiple cells which provide different functions.  Red blood cells carry oxygen and are used  for blood loss replacement.  White blood cells fight against infection.  Platelets control bleeding.  Plasma helps clot blood.  Other blood products are available for specialized needs, such as hemophilia or other clotting disorders. BEFORE THE TRANSFUSION  Who gives blood for transfusions?   Healthy volunteers who are fully evaluated to make sure their blood is safe. This is blood bank blood. Transfusion therapy is the safest it has ever been in the practice of medicine. Before blood is taken from a donor, a complete history is taken to make sure that person has no history of diseases nor engages in risky social behavior (examples are intravenous drug use or sexual activity with multiple partners). The donor's travel history is screened to minimize risk of transmitting infections, such as malaria. The donated blood is tested for signs of infectious diseases, such as HIV and hepatitis. The blood is then tested to be sure it is compatible with you in order to minimize the chance of a transfusion reaction. If you or a relative donates blood, this is often done in anticipation of surgery and is not appropriate for emergency situations. It takes many days to process the donated blood. RISKS AND COMPLICATIONS Although transfusion therapy is very safe and saves many lives, the main dangers of transfusion include:  1. Getting an infectious disease. 2. Developing a transfusion reaction. This is an allergic reaction to something in the blood you were given. Every precaution is taken to prevent this. The decision to have a blood transfusion has been considered carefully by your caregiver before blood is given. Blood is not given unless the benefits outweigh the risks. AFTER THE TRANSFUSION  Right after receiving a blood transfusion, you will usually feel much better and more energetic. This is especially true if your red blood cells have gotten low (anemic). The transfusion raises the level of the red  blood cells which carry oxygen, and this usually causes an energy increase.  The nurse administering the transfusion will monitor you carefully for complications. HOME CARE INSTRUCTIONS  No special instructions are needed after a transfusion. You may find your energy is better. Speak with your caregiver about any limitations on activity for underlying diseases you may have. SEEK MEDICAL CARE IF:   Your condition is not improving after your transfusion.  You develop redness or irritation at the intravenous (IV) site. SEEK IMMEDIATE MEDICAL CARE IF:  Any of the following symptoms occur over the next 12 hours:  Shaking chills.  You have a temperature by mouth above 102 F (38.9 C), not controlled by medicine.  Chest, back, or muscle pain.  People around you feel you are not acting correctly or are confused.  Shortness of breath or difficulty breathing.  Dizziness and fainting.  You get a rash or develop hives.  You have a decrease in urine output.  Your urine turns a dark color or changes to pink, red, or brown. Any of the following symptoms occur over the next 10 days:  You have a temperature by mouth above 102 F (38.9 C), not controlled by medicine.  Shortness of breath.  Weakness after normal activity.  The white part of the eye turns yellow (jaundice).  You have a decrease in the amount of urine or are urinating less often.  Your urine turns a dark color or changes to pink, red, or brown. Document Released: 12/17/1999 Document Revised: 03/13/2011 Document Reviewed: 08/05/2007 ExitCare Patient Information 2014 [place].  _______________________________________________________________________  Incentive Spirometer  An incentive spirometer is a tool that can help keep your lungs clear and active. This tool measures how well you are filling your lungs with each breath. Taking long deep breaths may help reverse or decrease the chance of developing breathing  (pulmonary) problems (especially infection) following:  A long period of time when you are unable to move or be active. BEFORE THE PROCEDURE   If the spirometer includes an indicator to show your best effort, your nurse or respiratory therapist will set it to a desired goal.  If possible, sit up straight or lean slightly forward. Try not to slouch.  Hold the incentive spirometer in an upright position. INSTRUCTIONS FOR USE  3. Sit on the edge of your bed if possible, or sit up as far as you can in bed or on a chair. 4. Hold the incentive spirometer in an upright position. 5. Breathe out normally. 6. Place the mouthpiece in your mouth and seal your lips tightly around it. 7. Breathe in slowly and as deeply as possible, raising the piston or the ball toward the top of the column. 8. Hold your breath for 3-5 seconds or for as long as possible. Allow the piston or ball to fall to the bottom of the column. 9. Remove the mouthpiece from your mouth and breathe out normally. 10. Rest for a few seconds and repeat Steps 1 through 7 at least 10 times every 1-2 hours when you are awake. Take your time and take a few normal breaths between deep breaths. 11. The spirometer may include an indicator to show your best effort. Use the indicator as a goal to work toward during each repetition. 12. After  each set of 10 deep breaths, practice coughing to be sure your lungs are clear. If you have an incision (the cut made at the time of surgery), support your incision when coughing by placing a pillow or rolled up towels firmly against it. Once you are able to get out of bed, walk around indoors and cough well. You may stop using the incentive spirometer when instructed by your caregiver.  RISKS AND COMPLICATIONS  Take your time so you do not get dizzy or light-headed.  If you are in pain, you may need to take or ask for pain medication before doing incentive spirometry. It is harder to take a deep breath if you  are having pain. AFTER USE  Rest and breathe slowly and easily.  It can be helpful to keep track of a log of your progress. Your caregiver can provide you with a simple table to help with this. If you are using the spirometer at home, follow these instructions: [place] IF:   You are having difficultly using the spirometer.  You have trouble using the spirometer as often as instructed.  Your pain medication is not giving enough relief while using the spirometer.  You develop fever of 100.5 F (38.1 C) or higher. SEEK IMMEDIATE MEDICAL CARE IF:   You cough up bloody sputum that had not been present before.  You develop fever of 102 F (38.9 C) or greater.  You develop worsening pain at or near the incision site. MAKE SURE YOU:   Understand these instructions.  Will watch your condition.  Will get help right away if you are not doing well or get worse. Document Released: 05/01/2006 Document Revised: 03/13/2011 Document Reviewed: 07/02/2006 [facility] Patient Information 2014 [place], [place].   ________________________________________________________________________

## 2015-01-29 ENCOUNTER — Encounter (HOSPITAL_COMMUNITY): Payer: Self-pay

## 2015-01-29 ENCOUNTER — Encounter (HOSPITAL_COMMUNITY)
Admission: RE | Admit: 2015-01-29 | Discharge: 2015-01-29 | Disposition: A | Discharge status: Home / Self Care | Payer: BC Managed Care – PPO | Source: Ambulatory Visit | Attending: Urology | Admitting: Urology

## 2015-01-29 ENCOUNTER — Ambulatory Visit (HOSPITAL_COMMUNITY)
Admission: RE | Admit: 2015-01-29 | Discharge: 2015-01-29 | Disposition: A | Discharge status: Home / Self Care | Payer: BC Managed Care – PPO | Source: Ambulatory Visit | Attending: Anesthesiology | Admitting: Anesthesiology

## 2015-01-29 DIAGNOSIS — Z01818 Encounter for other preprocedural examination: Secondary | ICD-10-CM

## 2015-01-29 HISTORY — DX: Malignant (primary) neoplasm, unspecified: C80.1

## 2015-01-29 HISTORY — DX: Unspecified osteoarthritis, unspecified site: M19.90

## 2015-01-29 HISTORY — DX: Gastro-esophageal reflux disease without esophagitis: K21.9

## 2015-01-29 LAB — BASIC METABOLIC PANEL
Anion gap: 10 (ref 5–15)
BUN: 17 mg/dL (ref 6–20)
CALCIUM: 9.1 mg/dL (ref 8.9–10.3)
CO2: 24 mmol/L (ref 22–32)
Chloride: 101 mmol/L (ref 101–111)
Creatinine, Ser: 0.85 mg/dL (ref 0.61–1.24)
GFR calc Af Amer: >60 mL/min (ref >60)
GLUCOSE: 100 mg/dL — AB (ref 65–99)
Potassium: 4.3 mmol/L (ref 3.5–5.1)
Sodium: 135 mmol/L (ref 135–145)

## 2015-01-29 LAB — CBC
HCT: 49.1 % (ref 39.0–52.0)
Hemoglobin: 16.4 g/dL (ref 13.0–17.0)
MCH: 31 pg (ref 26.0–34.0)
MCHC: 33.4 g/dL (ref 30.0–36.0)
MCV: 92.8 fL (ref 78.0–100.0)
Platelets: 270 10*3/uL (ref 150–400)
RBC: 5.29 MIL/uL (ref 4.22–5.81)
RDW: 12.9 % (ref 11.5–15.5)
WBC: 6.3 10*3/uL (ref 4.0–10.5)

## 2015-01-29 LAB — ABO/RH: ABO/RH(D): B POS

## 2015-01-29 NOTE — Progress Notes (Signed)
ECHO-04/30/12- chart Stress- 03/27/12-chart

## 2015-02-01 NOTE — Progress Notes (Signed)
Final EKG done 01/29/15- EPIC  

## 2015-02-03 NOTE — H&P (Signed)
Chief Complaint Prostate Cancer   Reason For Visit Reason for visit: 2nd opinion for treatment options for prostate cancer.  Primary urologist: Dr. Baruch Gouty  PCP: Dr. Salome Holmes   History of Present Illness [name] is a 65 year old gentleman who was found to have a persistently elevated PSA of 5.54 prompting urologic referral and eventually a TRUS biopsy by Dr. Pilar Jarvis on 12/17/14 revealing Gleason 4+4=8 adenocarcinoma of the prostate with 4 out of 12 biopsy cores positive for malignancy. Staging studies were performed including a CT scan of the pelvis which was negative for pelvic lymphadenopathy. A bone scan was performed on 01/07/15 which showed multiple areas of degenerative uptake with one particular area of uptake at the distal right femur that was likely degenerative although somewhat more suspicious for a possible metastatic lesion. He has no family history of prostate cancer. He has been counseled by Dr. Pilar Jarvis about his treatment options and has also been counseled by Dr. Berton Mount in Radiation Oncology. He is very well informed and has elected to proceed with surgical treatment of his prostate cancer. He is here today for a second opinion regarding his options.    TNM stage: cT1c Nx Mx  PSA: 5.54  Gleason score: 4+4=8  Biopsy (Acc # [id number], read by Dr. Elayne Guerin, [place], 12/17/14): 4/12 cores -- L apex (5%, 3+3=6), R mid (41%, 3+3=6), L lateral base (13%, 4+4=8), R base (50%, 3+36=6)  Prostate volume: 59.1 cc    Nomogram  OC disease: 29%  EPE: 68%  SVI: 14%  LNI: 15%  PFS (surgery): 52% at 5 years, 36% at 10 years    Urinary function: He has moderate lower urinary tract symptoms including a sense of incomplete emptying, frequency, and nocturia. IPSS is 9.  Erectile function: He has moderate erectile dysfunction and estimates that he can obtain an erection adequate for intercourse approximately 50% of time. SHIM score is 14.  He does not utilize any medication.   Past Medical History Problems  1. History of heartburn (Z87.898) 2. History of hyperlipidemia (Z86.39) 3. History of hypertension (Z86.79)  Surgical History Problems  1. History of No Surgical Problems  Current Meds 1. Daily Multivitamin TABS;  Therapy: (Recorded:17Jan2017) to Recorded 2. Lisinopril 5 MG Oral Tablet;  Therapy: (Recorded:17Jan2017) to Recorded 3. Metoprolol Tartrate 25 MG Oral Tablet;  Therapy: (Recorded:17Jan2017) to Recorded 4. RaNITidine HCl - 300 MG Oral Tablet;  Therapy: (Recorded:17Jan2017) to Recorded  Allergies Medication  1. No Known Drug Allergies  Family History Problems  1. Family history of Death of family member : Mother, Father   Mother at age 56; heart issuesFather at age 25; complications from 2nd stroke 2. Family history of stroke (Z82.3) : Father  Social History Problems    Alcohol use (Z78.9)   Married   Never a smoker   Number of children   1 son; 1 daughter   Occupation   Dealer  Review of Systems Genitourinary, constitutional, skin, eye, otolaryngeal, hematologic/lymphatic, cardiovascular, pulmonary, endocrine, musculoskeletal, gastrointestinal, neurological and psychiatric system(s) were reviewed and pertinent findings if present are noted and are otherwise negative.    Vitals Vital Signs [Data Includes: Last 1 Day]  Recorded: [id number] 11:31AM  Height: 5 ft 8 in Weight: 186 lb  BMI Calculated: 28.28 BSA Calculated: 1.98 Blood Pressure: 126 / 68 Temperature: 97.8 F Heart Rate: 53  Physical Exam Constitutional: Well nourished and well developed . No acute distress.  ENT:. The ears and nose are normal in  appearance.  Neck: The appearance of the neck is normal and no neck mass is present.  Pulmonary: No respiratory distress, normal respiratory rhythm and effort and clear bilateral breath sounds.  Cardiovascular: Heart rate and rhythm are normal . No peripheral edema.   Abdomen: The abdomen is soft and nontender. No masses are palpated. No CVA tenderness. No hernias are palpable. No hepatosplenomegaly noted.  Rectal: Rectal exam demonstrates normal sphincter tone, no tenderness and no masses. Prostate size is estimated to be 45 g. Normal rectal tone, no rectal masses, prostate is smooth, symmetric and non-tender. The prostate has no nodularity and is not tender. The left seminal vesicle is nonpalpable. The right seminal vesicle is nonpalpable. The perineum is normal on inspection.  Lymphatics: The femoral and inguinal nodes are not enlarged or tender.  Skin: Normal skin turgor, no visible rash and no visible skin lesions.  Neuro/Psych:. Mood and affect are appropriate.    Results/Data Urine [Data Includes: Last 1 Day]   [id number]  COLOR YELLOW   APPEARANCE CLEAR   SPECIFIC GRAVITY 1.020   pH 7.0   GLUCOSE NEGATIVE   BILIRUBIN NEGATIVE   KETONE TRACE   BLOOD NEGATIVE   PROTEIN NEGATIVE   NITRITE NEGATIVE   LEUKOCYTE ESTERASE NEGATIVE    I have reviewed his medical records, PSA results, and pathology report. I independently reviewed his CT scan and bone scan with findings as dictated above   Assessment Assessed  1. Prostate cancer (C61)  Plan Health Maintenance  1. UA With REFLEX; [Do Not Release]; Status:Complete;   [id number] 11:04AM Prostate cancer  2. Follow-up Office  Follow-up  Status: Complete  Done: [id number] 3. PT/OT Referral Referral  Referral  Status: Hold For - Appointment,PreCert,Date of  Service,Physical Therapy  Requested for: [id number]  Discussion/Summary 1. High risk localized prostate cancer: I had a long and detailed discussion with Mr. Overgaard and his wife today. We did discuss his staging studies in the uptake noted at the distal right femur. While I think the likelihood of an isolated metastatic lesion in this area is unlikely, we did discuss proceeding with plain film images to correlate with his bone scan for further  assessment. He is very well-informed about his options and has elected to proceed with surgical treatment assuming there is no evidence of metastatic disease.   The patient was counseled about the natural history of prostate cancer and the standard treatment options that are available for prostate cancer. It was explained to him how his age and life expectancy, clinical stage, Gleason score, and PSA affect his prognosis, the decision to proceed with additional staging studies, as well as how that information influences recommended treatment strategies. We discussed the roles for active surveillance, radiation therapy, surgical therapy, androgen deprivation, as well as ablative therapy options for the treatment of prostate cancer as appropriate to his individual cancer situation. We discussed the risks and benefits of these options with regard to their impact on cancer control and also in terms of potential adverse events, complications, and impact on quiality of life particularly related to urinary, bowel, and sexual function. The patient was encouraged to ask questions throughout the discussion today and all questions were answered to his stated satisfaction. In addition, the patient was provided with and/or directed to appropriate resources and literature for further education about prostate cancer and treatment options.   We discussed surgical therapy for prostate cancer including the different available surgical approaches. We discussed, in detail, the risks and expectations of surgery  with regard to cancer control, urinary control, and erectile function as well as the expected postoperative recovery process. Additional risks of surgery including but not limited to bleeding, infection, hernia formation, nerve damage, lymphocele formation, bowel/rectal injury potentially necessitating colostomy, damage to the urinary tract resulting in urine leakage, urethral stricture, and the cardiopulmonary risks such as  myocardial infarction, stroke, death, venothromboembolism, etc. were explained. The risk of open surgical conversion for robotic/laparoscopic prostatectomy was also discussed.     I have ordered plain films of his distal right femur for further evaluation to correlate with his bone scan. He is very well-informed and does wish to proceed with surgical treatment. I told him that I think he is in excellent hands with Dr. Pilar Jarvis and would do very well proceeding with treatment in [place]. However, after considering their options and wanting to proceed with surgery sooner than is available in [place], Mr. Lavallee and his wife ultimately expressed their desire to proceed with treatment in [place]. He will therefore be scheduled for a bilateral nerve sparing robot-assisted laparoscopic radical prostatectomy and bilateral pelvic lymphadenectomy assuming that his metastatic evaluation is indeed negative.    Cc: Dr. Salome Holmes  Dr. Baruch Gouty  A total of 65 minutes were spent in the overall care of the patient today with 55 minutes in direct face to face consultation.    Signatures Electronically signed by : Raynelle Bring, M.D.; Jan 19 2015  5:13PM EST

## 2015-02-04 ENCOUNTER — Inpatient Hospital Stay (HOSPITAL_COMMUNITY)
Admission: RE | Admit: 2015-02-04 | Discharge: 2015-02-05 | DRG: 708 | Disposition: A | Discharge status: Home / Self Care | Payer: BC Managed Care – PPO | Source: Ambulatory Visit | Attending: Urology | Admitting: Urology

## 2015-02-04 ENCOUNTER — Encounter (HOSPITAL_COMMUNITY): Payer: Self-pay | Admitting: *Deleted

## 2015-02-04 ENCOUNTER — Other Ambulatory Visit: Payer: BC Managed Care – PPO

## 2015-02-04 ENCOUNTER — Encounter (HOSPITAL_COMMUNITY)
Admission: RE | Disposition: A | Discharge status: Home / Self Care | Payer: Self-pay | Source: Ambulatory Visit | Attending: Urology

## 2015-02-04 ENCOUNTER — Inpatient Hospital Stay (HOSPITAL_COMMUNITY): Payer: BC Managed Care – PPO | Admitting: Registered Nurse

## 2015-02-04 DIAGNOSIS — Z823 Family history of stroke: Secondary | ICD-10-CM

## 2015-02-04 DIAGNOSIS — C61 Malignant neoplasm of prostate: Principal | ICD-10-CM | POA: Diagnosis present

## 2015-02-04 DIAGNOSIS — Z79899 Other long term (current) drug therapy: Secondary | ICD-10-CM | POA: Diagnosis not present

## 2015-02-04 DIAGNOSIS — K219 Gastro-esophageal reflux disease without esophagitis: Secondary | ICD-10-CM | POA: Diagnosis present

## 2015-02-04 DIAGNOSIS — R351 Nocturia: Secondary | ICD-10-CM | POA: Diagnosis present

## 2015-02-04 DIAGNOSIS — I10 Essential (primary) hypertension: Secondary | ICD-10-CM | POA: Diagnosis present

## 2015-02-04 DIAGNOSIS — N529 Male erectile dysfunction, unspecified: Secondary | ICD-10-CM | POA: Diagnosis present

## 2015-02-04 HISTORY — PX: ROBOT ASSISTED LAPAROSCOPIC RADICAL PROSTATECTOMY: SHX5141

## 2015-02-04 HISTORY — PX: LYMPHADENECTOMY: SHX5960

## 2015-02-04 LAB — TYPE AND SCREEN
ABO/RH(D): B POS
ANTIBODY SCREEN: NEGATIVE

## 2015-02-04 LAB — HEMOGLOBIN AND HEMATOCRIT, BLOOD
HCT: 44.1 % (ref 39.0–52.0)
Hemoglobin: 14.9 g/dL (ref 13.0–17.0)

## 2015-02-04 SURGERY — XI ROBOTIC ASSISTED LAPAROSCOPIC RADICAL PROSTATECTOMY LEVEL 2
Anesthesia: General

## 2015-02-04 MED ORDER — LACTATED RINGERS IV SOLN
INTRAVENOUS | Status: DC | PRN
  Administered 2015-02-04: 07:00:00 via INTRAVENOUS

## 2015-02-04 MED ORDER — HYDROCODONE-ACETAMINOPHEN 5-325 MG PO TABS: 1.0000 | ORAL_TABLET | Freq: Four times a day (QID) | ORAL | Status: DC | PRN

## 2015-02-04 MED ORDER — LACTATED RINGERS IV SOLN: INTRAVENOUS | Status: DC

## 2015-02-04 MED ORDER — ACETAMINOPHEN 325 MG PO TABS: 650.0000 mg | ORAL_TABLET | ORAL | Status: DC | PRN

## 2015-02-04 MED ORDER — ATROPINE SULFATE 0.4 MG/ML IJ SOLN
INTRAMUSCULAR | Status: AC
  Filled 2015-02-04: qty 2

## 2015-02-04 MED ORDER — DOCUSATE SODIUM 100 MG PO CAPS
100.0000 mg | ORAL_CAPSULE | Freq: Two times a day (BID) | ORAL | Status: DC
  Administered 2015-02-04 – 2015-02-05 (×2): 100 mg via ORAL
  Filled 2015-02-04 (×2): qty 1

## 2015-02-04 MED ORDER — KCL IN DEXTROSE-NACL 20-5-0.45 MEQ/L-%-% IV SOLN
INTRAVENOUS | Status: DC
  Administered 2015-02-04 – 2015-02-05 (×3): via INTRAVENOUS
  Filled 2015-02-04 (×4): qty 1000

## 2015-02-04 MED ORDER — ROCURONIUM BROMIDE 100 MG/10ML IV SOLN
INTRAVENOUS | Status: DC | PRN
  Administered 2015-02-04: 5 mg via INTRAVENOUS
  Administered 2015-02-04: 40 mg via INTRAVENOUS
  Administered 2015-02-04: 10 mg via INTRAVENOUS
  Administered 2015-02-04: 5 mg via INTRAVENOUS
  Administered 2015-02-04 (×2): 10 mg via INTRAVENOUS

## 2015-02-04 MED ORDER — SUCCINYLCHOLINE CHLORIDE 20 MG/ML IJ SOLN
INTRAMUSCULAR | Status: DC | PRN
  Administered 2015-02-04: 100 mg via INTRAVENOUS

## 2015-02-04 MED ORDER — SUGAMMADEX SODIUM 200 MG/2ML IV SOLN
INTRAVENOUS | Status: DC | PRN
  Administered 2015-02-04: 170 mg via INTRAVENOUS

## 2015-02-04 MED ORDER — PROPOFOL 10 MG/ML IV BOLUS
INTRAVENOUS | Status: DC | PRN
Start: 2015-02-04 — End: 2015-02-04
  Administered 2015-02-04: 200 mg via INTRAVENOUS

## 2015-02-04 MED ORDER — DIPHENHYDRAMINE HCL 12.5 MG/5ML PO ELIX: 12.5000 mg | ORAL_SOLUTION | Freq: Four times a day (QID) | ORAL | Status: DC | PRN

## 2015-02-04 MED ORDER — FENTANYL CITRATE (PF) 250 MCG/5ML IJ SOLN
INTRAMUSCULAR | Status: AC
  Filled 2015-02-04: qty 5

## 2015-02-04 MED ORDER — SODIUM CHLORIDE 0.9 % IJ SOLN
INTRAMUSCULAR | Status: AC
  Filled 2015-02-04: qty 10

## 2015-02-04 MED ORDER — CEFAZOLIN SODIUM-DEXTROSE 2-3 GM-% IV SOLR
INTRAVENOUS | Status: AC
  Filled 2015-02-04: qty 50

## 2015-02-04 MED ORDER — GLYCOPYRROLATE 0.2 MG/ML IJ SOLN
INTRAMUSCULAR | Status: AC
  Filled 2015-02-04: qty 1

## 2015-02-04 MED ORDER — LIDOCAINE HCL (CARDIAC) 20 MG/ML IV SOLN
INTRAVENOUS | Status: DC | PRN
  Administered 2015-02-04: 100 mg via INTRAVENOUS

## 2015-02-04 MED ORDER — HYDROMORPHONE HCL 2 MG/ML IJ SOLN
INTRAMUSCULAR | Status: AC
  Filled 2015-02-04: qty 1

## 2015-02-04 MED ORDER — HEPARIN SODIUM (PORCINE) 1000 UNIT/ML IJ SOLN
INTRAMUSCULAR | Status: AC
  Filled 2015-02-04: qty 1

## 2015-02-04 MED ORDER — HYDROMORPHONE HCL 1 MG/ML IJ SOLN
INTRAMUSCULAR | Status: AC
  Filled 2015-02-04: qty 1

## 2015-02-04 MED ORDER — HYDROMORPHONE HCL 1 MG/ML IJ SOLN
INTRAMUSCULAR | Status: DC | PRN
Start: 2015-02-04 — End: 2015-02-04
  Administered 2015-02-04 (×3): 0.5 mg via INTRAVENOUS

## 2015-02-04 MED ORDER — DIPHENHYDRAMINE HCL 50 MG/ML IJ SOLN: 12.5000 mg | Freq: Four times a day (QID) | INTRAMUSCULAR | Status: DC | PRN

## 2015-02-04 MED ORDER — OXYCODONE HCL 5 MG PO TABS: 5.0000 mg | ORAL_TABLET | Freq: Once | ORAL | Status: DC | PRN

## 2015-02-04 MED ORDER — KETOROLAC TROMETHAMINE 15 MG/ML IJ SOLN
15.0000 mg | Freq: Four times a day (QID) | INTRAMUSCULAR | Status: DC
Start: 2015-02-04 — End: 2015-02-05
  Administered 2015-02-04 – 2015-02-05 (×4): 15 mg via INTRAVENOUS
  Filled 2015-02-04 (×4): qty 1

## 2015-02-04 MED ORDER — ONDANSETRON HCL 4 MG/2ML IJ SOLN
INTRAMUSCULAR | Status: DC | PRN
  Administered 2015-02-04: 4 mg via INTRAVENOUS

## 2015-02-04 MED ORDER — FAMOTIDINE 20 MG PO TABS
20.0000 mg | ORAL_TABLET | Freq: Every day | ORAL | Status: DC
  Administered 2015-02-04 – 2015-02-05 (×2): 20 mg via ORAL
  Filled 2015-02-04 (×2): qty 1

## 2015-02-04 MED ORDER — MIDAZOLAM HCL 5 MG/5ML IJ SOLN
INTRAMUSCULAR | Status: DC | PRN
  Administered 2015-02-04: 2 mg via INTRAVENOUS

## 2015-02-04 MED ORDER — ONDANSETRON HCL 4 MG/2ML IJ SOLN
INTRAMUSCULAR | Status: AC
  Filled 2015-02-04: qty 2

## 2015-02-04 MED ORDER — BUPIVACAINE-EPINEPHRINE (PF) 0.25% -1:200000 IJ SOLN
INTRAMUSCULAR | Status: AC
Start: 2015-02-04 — End: 2015-02-04
  Filled 2015-02-04: qty 30

## 2015-02-04 MED ORDER — GLYCOPYRROLATE 0.2 MG/ML IJ SOLN
INTRAMUSCULAR | Status: DC | PRN
  Administered 2015-02-04: 0.2 mg via INTRAVENOUS

## 2015-02-04 MED ORDER — HEPARIN SODIUM (PORCINE) 1000 UNIT/ML IJ SOLN
INTRAMUSCULAR | Status: DC | PRN
  Administered 2015-02-04: 08:00:00

## 2015-02-04 MED ORDER — MORPHINE SULFATE (PF) 2 MG/ML IV SOLN: 2.0000 mg | INTRAVENOUS | Status: DC | PRN

## 2015-02-04 MED ORDER — CEFAZOLIN SODIUM 1-5 GM-% IV SOLN
1.0000 g | Freq: Three times a day (TID) | INTRAVENOUS | Status: AC
Start: 2015-02-04 — End: 2015-02-05
  Administered 2015-02-04 – 2015-02-05 (×2): 1 g via INTRAVENOUS
  Filled 2015-02-04 (×3): qty 50

## 2015-02-04 MED ORDER — SULFAMETHOXAZOLE-TRIMETHOPRIM 800-160 MG PO TABS: 1.0000 | ORAL_TABLET | Freq: Two times a day (BID) | ORAL | Status: DC

## 2015-02-04 MED ORDER — OXYCODONE HCL 5 MG/5ML PO SOLN: 5.0000 mg | Freq: Once | ORAL | Status: DC | PRN

## 2015-02-04 MED ORDER — EPHEDRINE SULFATE 50 MG/ML IJ SOLN
INTRAMUSCULAR | Status: AC
  Filled 2015-02-04: qty 1

## 2015-02-04 MED ORDER — ROCURONIUM BROMIDE 100 MG/10ML IV SOLN
INTRAVENOUS | Status: AC
  Filled 2015-02-04: qty 1

## 2015-02-04 MED ORDER — MIDAZOLAM HCL 2 MG/2ML IJ SOLN
INTRAMUSCULAR | Status: AC
  Filled 2015-02-04: qty 2

## 2015-02-04 MED ORDER — HYDROMORPHONE HCL 1 MG/ML IJ SOLN
0.2500 mg | INTRAMUSCULAR | Status: DC | PRN
  Administered 2015-02-04: 0.5 mg via INTRAVENOUS

## 2015-02-04 MED ORDER — FENTANYL CITRATE (PF) 100 MCG/2ML IJ SOLN
INTRAMUSCULAR | Status: DC | PRN
  Administered 2015-02-04 (×5): 50 ug via INTRAVENOUS

## 2015-02-04 MED ORDER — ONDANSETRON HCL 4 MG/2ML IJ SOLN
4.0000 mg | Freq: Four times a day (QID) | INTRAMUSCULAR | Status: DC | PRN
Start: 2015-02-04 — End: 2015-02-04

## 2015-02-04 MED ORDER — SODIUM CHLORIDE 0.9 % IV BOLUS (SEPSIS)
1000.0000 mL | Freq: Once | INTRAVENOUS | Status: AC
  Administered 2015-02-04: 1000 mL via INTRAVENOUS

## 2015-02-04 MED ORDER — METOPROLOL TARTRATE 25 MG PO TABS
12.5000 mg | ORAL_TABLET | Freq: Two times a day (BID) | ORAL | Status: DC
  Administered 2015-02-04 – 2015-02-05 (×2): 12.5 mg via ORAL
  Filled 2015-02-04 (×2): qty 1

## 2015-02-04 MED ORDER — LIDOCAINE HCL (CARDIAC) 20 MG/ML IV SOLN
INTRAVENOUS | Status: AC
  Filled 2015-02-04: qty 5

## 2015-02-04 MED ORDER — CEFAZOLIN SODIUM-DEXTROSE 2-3 GM-% IV SOLR
2.0000 g | INTRAVENOUS | Status: AC
  Administered 2015-02-04 (×2): 2 g via INTRAVENOUS

## 2015-02-04 MED ORDER — BUPIVACAINE-EPINEPHRINE 0.25% -1:200000 IJ SOLN
INTRAMUSCULAR | Status: DC | PRN
  Administered 2015-02-04: 30 mL

## 2015-02-04 MED ORDER — SUGAMMADEX SODIUM 200 MG/2ML IV SOLN
INTRAVENOUS | Status: AC
  Filled 2015-02-04: qty 2

## 2015-02-04 MED ORDER — STERILE WATER FOR IRRIGATION IR SOLN
Status: DC | PRN
  Administered 2015-02-04: 1000 mL

## 2015-02-04 MED ORDER — SODIUM CHLORIDE 0.9 % IR SOLN
Status: DC | PRN
  Administered 2015-02-04: 1000 mL

## 2015-02-04 MED ORDER — PROPOFOL 10 MG/ML IV BOLUS
INTRAVENOUS | Status: AC
  Filled 2015-02-04: qty 20

## 2015-02-04 SURGICAL SUPPLY — 54 items
APPLICATOR COTTON TIP 6IN STRL (MISCELLANEOUS) ×4 IMPLANT
CATH FOLEY 2WAY SLVR 18FR 30CC (CATHETERS) ×4 IMPLANT
CATH ROBINSON RED A/P 16FR (CATHETERS) ×4 IMPLANT
CATH ROBINSON RED A/P 8FR (CATHETERS) ×4 IMPLANT
CATH TIEMANN FOLEY 18FR 5CC (CATHETERS) ×4 IMPLANT
CHLORAPREP W/TINT 26ML (MISCELLANEOUS) ×4 IMPLANT
CLIP LIGATING HEM O LOK PURPLE (MISCELLANEOUS) ×24 IMPLANT
CLOTH BEACON ORANGE TIMEOUT ST (SAFETY) ×4 IMPLANT
COVER SURGICAL LIGHT HANDLE (MISCELLANEOUS) ×4 IMPLANT
COVER TIP SHEARS 8 DVNC (MISCELLANEOUS) ×2 IMPLANT
COVER TIP SHEARS 8MM DA VINCI (MISCELLANEOUS) ×2
CUTTER ECHEON FLEX ENDO 45 340 (ENDOMECHANICALS) ×4 IMPLANT
DECANTER SPIKE VIAL GLASS SM (MISCELLANEOUS) IMPLANT
DRAPE ARM DVNC X/XI (DISPOSABLE) ×8 IMPLANT
DRAPE COLUMN DVNC XI (DISPOSABLE) ×2 IMPLANT
DRAPE DA VINCI XI ARM (DISPOSABLE) ×8
DRAPE DA VINCI XI COLUMN (DISPOSABLE) ×2
DRAPE SURG IRRIG POUCH 19X23 (DRAPES) ×4 IMPLANT
DRSG TEGADERM 4X4.75 (GAUZE/BANDAGES/DRESSINGS) ×4 IMPLANT
ELECT REM PT RETURN 9FT ADLT (ELECTROSURGICAL) ×4
ELECTRODE REM PT RTRN 9FT ADLT (ELECTROSURGICAL) ×2 IMPLANT
GLOVE BIO SURGEON STRL SZ 6.5 (GLOVE) ×3 IMPLANT
GLOVE BIO SURGEONS STRL SZ 6.5 (GLOVE) ×1
GLOVE BIOGEL M STRL SZ7.5 (GLOVE) ×8 IMPLANT
GOWN STRL REUS W/TWL LRG LVL3 (GOWN DISPOSABLE) ×12 IMPLANT
HOLDER FOLEY CATH W/STRAP (MISCELLANEOUS) ×4 IMPLANT
IV LACTATED RINGERS 1000ML (IV SOLUTION) IMPLANT
LIQUID BAND (GAUZE/BANDAGES/DRESSINGS) ×4 IMPLANT
MANIFOLD NEPTUNE II (INSTRUMENTS) ×4 IMPLANT
NDL SAFETY ECLIPSE 18X1.5 (NEEDLE) ×2 IMPLANT
NEEDLE HYPO 18GX1.5 SHARP (NEEDLE) ×2
PACK ROBOT UROLOGY CUSTOM (CUSTOM PROCEDURE TRAY) ×4 IMPLANT
RELOAD GREEN ECHELON 45 (STAPLE) ×4 IMPLANT
SEAL CANN UNIV 5-8 DVNC XI (MISCELLANEOUS) ×8 IMPLANT
SEAL XI 5MM-8MM UNIVERSAL (MISCELLANEOUS) ×8
SET TUBE IRRIG SUCTION NO TIP (IRRIGATION / IRRIGATOR) ×4 IMPLANT
SOLUTION ELECTROLUBE (MISCELLANEOUS) ×4 IMPLANT
SUT ETHILON 3 0 PS 1 (SUTURE) ×4 IMPLANT
SUT MNCRL 3 0 RB1 (SUTURE) ×2 IMPLANT
SUT MNCRL 3 0 VIOLET RB1 (SUTURE) ×2 IMPLANT
SUT MNCRL AB 4-0 PS2 18 (SUTURE) ×8 IMPLANT
SUT MONOCRYL 3 0 RB1 (SUTURE) ×4
SUT VIC AB 0 CT1 27 (SUTURE) ×2
SUT VIC AB 0 CT1 27XBRD ANTBC (SUTURE) ×2 IMPLANT
SUT VIC AB 0 UR5 27 (SUTURE) ×4 IMPLANT
SUT VIC AB 2-0 SH 27 (SUTURE) ×2
SUT VIC AB 2-0 SH 27X BRD (SUTURE) ×2 IMPLANT
SUT VIC AB 3-0 SH 27 (SUTURE) ×8
SUT VIC AB 3-0 SH 27XBRD (SUTURE) ×8 IMPLANT
SUT VICRYL 0 UR6 27IN ABS (SUTURE) ×8 IMPLANT
SYR 27GX1/2 1ML LL SAFETY (SYRINGE) ×4 IMPLANT
TOWEL OR 17X26 10 PK STRL BLUE (TOWEL DISPOSABLE) ×4 IMPLANT
TOWEL OR NON WOVEN STRL DISP B (DISPOSABLE) ×4 IMPLANT
WATER STERILE IRR 1500ML POUR (IV SOLUTION) IMPLANT

## 2015-02-04 NOTE — Transfer of Care (Signed)
Immediate Anesthesia Transfer of Care Note  Patient: Patrick Pena  Procedure(s) Performed: Procedure(s): XI ROBOTIC ASSISTED LAPAROSCOPIC RADICAL PROSTATECTOMY LEVEL 2 (N/A) BILATERAL PELVIC LYMPHADENECTOMY (Bilateral)  Patient Location: PACU  Anesthesia Type:General  Level of Consciousness: awake, alert , oriented and patient cooperative  Airway & Oxygen Therapy: Patient Spontanous Breathing and Patient connected to face mask oxygen  Post-op Assessment: Report given to RN, Post -op Vital signs reviewed and stable and Patient moving all extremities  Post vital signs: Reviewed and stable  Last Vitals:  Filed Vitals:   02/04/15 0505  BP: 138/72  Pulse: 55  Temp: 36.7 C  Resp: 18    Complications: No apparent anesthesia complications

## 2015-02-04 NOTE — Anesthesia Preprocedure Evaluation (Addendum)
Anesthesia Evaluation  Patient identified by MRN, date of birth, ID band Patient awake    Reviewed: Allergy & Precautions, NPO status , Patient's Chart, lab work & pertinent test results  Airway Mallampati: II   Neck ROM: full    Dental   Pulmonary neg pulmonary ROS,    breath sounds clear to auscultation       Cardiovascular hypertension,  Rhythm:regular Rate:Normal     Neuro/Psych    GI/Hepatic GERD  ,  Endo/Other    Renal/GU    Prostate CA.    Musculoskeletal  (+) Arthritis ,   Abdominal   Peds  Hematology   Anesthesia Other Findings   Reproductive/Obstetrics                            Anesthesia Physical Anesthesia Plan  ASA: II  Anesthesia Plan: General   Post-op Pain Management:    Induction: Intravenous  Airway Management Planned: Oral ETT  Additional Equipment:   Intra-op Plan:   Post-operative Plan: Extubation in OR  Informed Consent: I have reviewed the patients History and Physical, chart, labs and discussed the procedure including the risks, benefits and alternatives for the proposed anesthesia with the patient or authorized representative who has indicated his/her understanding and acceptance.     Plan Discussed with: CRNA, Anesthesiologist and Surgeon  Anesthesia Plan Comments:         Anesthesia Quick Evaluation

## 2015-02-04 NOTE — Op Note (Addendum)
Preoperative diagnosis: Clinically localized adenocarcinoma of the prostate (clinical stage T1c N0 M0)  Postoperative diagnosis: Clinically localized adenocarcinoma of the prostate (clinical stage T1c N0 M0)  Procedure:  1. Robotic assisted laparoscopic radical prostatectomy (bilateral nerve sparing) 2. Bilateral robotic assisted laparoscopic pelvic lymphadenectomy  Surgeon: Pryor Curia. M.D.  Assistant: Debbrah Alar, PA-C  Resident: Dr. Verdis Frederickson  Anesthesia: General  Complications: None  EBL: 150 mL  IVF:  2200 mL crystalloid  Specimens: 1. Prostate and seminal vesicles 2. Right pelvic lymph nodes 3. Left pelvic lymph nodes  Disposition of specimens: Pathology  Drains: 1. 20 Fr coude catheter 2. # 19 Blake pelvic drain  Indication: Patrick Pena is a 65 y.o. year old patient with clinically localized prostate cancer.  After a thorough review of the management options for treatment of prostate cancer, he elected to proceed with surgical therapy and the above procedure(s).  We have discussed the potential benefits and risks of the procedure, side effects of the proposed treatment, the likelihood of the patient achieving the goals of the procedure, and any potential problems that might occur during the procedure or recuperation. Informed consent has been obtained.  Description of procedure:  The patient was taken to the operating room and a general anesthetic was administered. He was given preoperative antibiotics, placed in the dorsal lithotomy position, and prepped and draped in the usual sterile fashion. Next a preoperative timeout was performed. A urethral catheter was placed into the bladder and a site was selected near the umbilicus for placement of the camera port. This was placed using a standard open Hassan technique which allowed entry into the peritoneal cavity under direct vision and without difficulty. An 8 mm port was placed and a pneumoperitoneum  established. The camera was then used to inspect the abdomen and there was no evidence of any intra-abdominal injuries or other abnormalities. The remaining abdominal ports were then placed. 8 mm robotic ports were placed in the right lower quadrant, left lower quadrant, and far left lateral abdominal wall. A 5 mm port was placed in the right upper quadrant and a 12 mm port was placed in the right lateral abdominal wall for laparoscopic assistance. All ports were placed under direct vision without difficulty. The surgical cart was then docked.   Utilizing the cautery scissors, the bladder was reflected posteriorly allowing entry into the space of Retzius and identification of the endopelvic fascia and prostate. The periprostatic fat was then removed from the prostate allowing full exposure of the endopelvic fascia. The endopelvic fascia was then incised from the apex back to the base of the prostate bilaterally and the underlying levator muscle fibers were swept laterally off the prostate thereby isolating the dorsal venous complex. The dorsal vein was then stapled and divided with a 45 mm Flex Echelon stapler. Attention then turned to the bladder neck which was divided anteriorly thereby allowing entry into the bladder and exposure of the urethral catheter. The catheter balloon was deflated and the catheter was brought into the operative field and used to retract the prostate anteriorly. The posterior bladder neck was then examined and was divided allowing further dissection between the bladder and prostate posteriorly until the vasa deferentia and seminal vessels were identified. The vasa deferentia were isolated, divided, and lifted anteriorly. The seminal vesicles were dissected down to their tips with care to control the seminal vascular arterial blood supply. These structures were then lifted anteriorly and the space between Denonvillier's fascia and the anterior rectum  was developed with a combination of  sharp and blunt dissection. This isolated the vascular pedicles of the prostate.  The lateral prostatic fascia was then sharply incised allowing release of the neurovascular bundles bilaterally. The vascular pedicles of the prostate were then ligated with Weck clips between the prostate and neurovascular bundles and divided with sharp cold scissor dissection resulting in neurovascular bundle preservation. The neurovascular bundles were then separated off the apex of the prostate and urethra bilaterally.  The urethra was then sharply transected allowing the prostate specimen to be disarticulated. The pelvis was copiously irrigated and hemostasis was ensured. There was no evidence for rectal injury.  Attention then turned to the right pelvic sidewall. The fibrofatty tissue between the external iliac vein, confluence of the iliac vessels, hypogastric artery, and Cooper's ligament was dissected free from the pelvic sidewall with care to preserve the obturator nerve. Weck clips were used for lymphostasis and hemostasis. An identical procedure was performed on the contralateral side and the lymphatic packets were removed for permanent pathologic analysis.  Attention then turned to the urethral anastomosis. A 2-0 Vicryl slip knot was placed between Denonvillier's fascia, the posterior bladder neck, and the posterior urethra to reapproximate these structures.  The bladder neck was somewhat wide and thinned toward the lateral edges.  3-0 vicryl sutures were therefore used to reinforce the lateral edges and to close the bladder neck slightly to facilitate the anastomosis.  The ureteral orifices were able to be identified and it was ensured that they were patent.  A double-armed 3-0 Monocryl suture was then used to perform a 360 running tension-free anastomosis between the bladder neck and urethra. A new urethral catheter was then placed into the bladder and irrigated. There were no blood clots within the bladder and  the anastomosis appeared to be watertight. A #19 Blake drain was then brought through the left lateral 8 mm port site and positioned appropriately within the pelvis. It was secured to the skin with a nylon suture. The surgical cart was then undocked. The right lateral 12 mm port site was closed at the fascial level with a 0 Vicryl suture placed laparoscopically. All remaining ports were then removed under direct vision. The prostate specimen was removed intact within the Endopouch retrieval bag via the periumbilical camera port site. This fascial opening was closed with two running 0 Vicryl sutures. 0.25% Marcaine was then injected into all port sites and all incisions were reapproximated at the skin level with 4-0 Monocryl subcuticular sutures and Dermabond. The patient appeared to tolerate the procedure well and without complications. The patient was able to be extubated and transferred to the recovery unit in satisfactory condition.   Pryor Curia MD

## 2015-02-04 NOTE — Discharge Instructions (Signed)

## 2015-02-04 NOTE — Anesthesia Procedure Notes (Signed)
Procedure Name: Intubation Date/Time: 02/04/2015 7:17 AM Performed by: Carleene Cooper A Pre-anesthesia Checklist: Patient identified, Emergency Drugs available, Suction available, Patient being monitored and Timeout performed Patient Re-evaluated:Patient Re-evaluated prior to inductionOxygen Delivery Method: Circle system utilized Preoxygenation: Pre-oxygenation with 100% oxygen Intubation Type: IV induction Ventilation: Mask ventilation without difficulty Laryngoscope Size: Mac and 4 Grade View: Grade I Tube type: Oral Tube size: 7.5 mm Number of attempts: 2 Airway Equipment and Method: Stylet Placement Confirmation: ETT inserted through vocal cords under direct vision,  positive ETCO2 and breath sounds checked- equal and bilateral Secured at: 23 cm Tube secured with: Tape Dental Injury: Teeth and Oropharynx as per pre-operative assessment  Comments: Dl  X 1 by CRNA, Grade 2 view, - ETCO2. ETT removed, sats 100%, head repositioned, DL X1 by CRNA, Grade 1 view. ATOI. BBS=

## 2015-02-04 NOTE — Anesthesia Postprocedure Evaluation (Signed)
Anesthesia Post Note  Patient: Patrick Pena  Procedure(s) Performed: Procedure(s) (LRB): XI ROBOTIC ASSISTED LAPAROSCOPIC RADICAL PROSTATECTOMY LEVEL 2 (N/A) BILATERAL PELVIC LYMPHADENECTOMY (Bilateral)  Patient location during evaluation: PACU Anesthesia Type: General Level of consciousness: awake and alert and patient cooperative Pain management: pain level controlled Vital Signs Assessment: post-procedure vital signs reviewed and stable Respiratory status: spontaneous breathing and respiratory function stable Cardiovascular status: stable Anesthetic complications: no    Last Vitals:  Filed Vitals:   02/04/15 1200 02/04/15 1215  BP: 153/81 149/71  Pulse: 79 80  Temp:    Resp: 20 21    Last Pain: There were no vitals filed for this visit.               [place]

## 2015-02-04 NOTE — Progress Notes (Signed)
Utilization review completed.  

## 2015-02-04 NOTE — Progress Notes (Signed)
Post-op note  Subjective: The patient is doing well.  No complaints. No pain. No n/v. Tol clears. Light cherry foley output, serosang JP output. Labs stable.  Objective: Vital signs in last 24 hours: Temp:  [98 F (36.7 C)-98.5 F (36.9 C)] 98.3 F (36.8 C) (02/02 1247) Pulse Rate:  [55-84] 76 (02/02 1247) Resp:  [16-21] 16 (02/02 1247) BP: (136-153)/(71-81) 147/77 mmHg (02/02 1247) SpO2:  [98 %-100 %] 100 % (02/02 1247) Weight:  [83.008 kg (183 lb)] 83.008 kg (183 lb) (02/02 0506)  Intake/Output from previous day:   Intake/Output this shift: Total I/O In: 2470 [I.V.:2470] Out: 270 [Drains:120; Blood:150]  Physical Exam:  General: Alert and oriented. Abdomen: Soft, Nondistended. Incisions: Clean and dry. GU: foley draining clear, yellow urine. Lab Results:  Recent Labs  02/04/15 1220  HGB 14.9  HCT 44.1    Assessment/Plan: POD#0   1) Continue to monitor     LOS: 0 days   Star Age 02/04/2015, 4:41 PM

## 2015-02-04 NOTE — Interval H&P Note (Signed)
History and Physical Interval Note:  02/04/2015 6:51 AM  Patrick Pena  has presented today for surgery, with the diagnosis of PROSTATE CANCER  The various methods of treatment have been discussed with the patient and family. After consideration of risks, benefits and other options for treatment, the patient has consented to  Procedure(s): XI ROBOTIC ASSISTED LAPAROSCOPIC RADICAL PROSTATECTOMY LEVEL 2 (N/A) BILATERAL PELVIC LYMPHADENECTOMY (Bilateral) as a surgical intervention .  The patient's history has been reviewed, patient examined, no change in status, stable for surgery.  I have reviewed the patient's chart and labs.  Questions were answered to the patient's satisfaction.     [place],LES

## 2015-02-04 NOTE — Progress Notes (Signed)
Post-op note  Subjective: The patient is doing well.  No complaints.  Denies N/V  Objective: Vital signs in last 24 hours: Temp:  [98 F (36.7 C)-98.5 F (36.9 C)] 98.3 F (36.8 C) (02/02 1247) Pulse Rate:  [55-84] 76 (02/02 1247) Resp:  [16-21] 16 (02/02 1247) BP: (136-153)/(71-81) 147/77 mmHg (02/02 1247) SpO2:  [98 %-100 %] 100 % (02/02 1247) Weight:  [83.008 kg (183 lb)] 83.008 kg (183 lb) (02/02 0506)  Intake/Output from previous day:   Intake/Output this shift: Total I/O In: 2470 [I.V.:2470] Out: 270 [Drains:120; Blood:150]  400cc urine in bag   Physical Exam:  General: Alert and oriented. Abdomen: Soft, Nondistended. Incisions: Clean and dry. Urine: dark red  Lab Results:  Recent Labs  02/04/15 1220  HGB 14.9  HCT 44.1    Assessment/Plan: POD#0   1) Continue to monitor  2) DVT prophy, clears, IS, amb, pain control   LOS: 0 days   Rosario Kushner 02/04/2015, 4:40 PM

## 2015-02-05 LAB — BASIC METABOLIC PANEL
Anion gap: 6 (ref 5–15)
BUN: 12 mg/dL (ref 6–20)
CALCIUM: 8 mg/dL — AB (ref 8.9–10.3)
CO2: 26 mmol/L (ref 22–32)
CREATININE: 0.96 mg/dL (ref 0.61–1.24)
Chloride: 103 mmol/L (ref 101–111)
GFR calc non Af Amer: >60 mL/min (ref >60)
Glucose, Bld: 116 mg/dL — ABNORMAL HIGH (ref 65–99)
Potassium: 4.3 mmol/L (ref 3.5–5.1)
SODIUM: 135 mmol/L (ref 135–145)

## 2015-02-05 LAB — CREATININE, FLUID (PLEURAL, PERITONEAL, JP DRAINAGE): CREAT FL: 1 mg/dL

## 2015-02-05 LAB — HEMOGLOBIN AND HEMATOCRIT, BLOOD
HEMATOCRIT: 40.6 % (ref 39.0–52.0)
HEMOGLOBIN: 13.2 g/dL (ref 13.0–17.0)

## 2015-02-05 MED ORDER — HYDROCODONE-ACETAMINOPHEN 5-325 MG PO TABS: 1.0000 | ORAL_TABLET | Freq: Four times a day (QID) | ORAL | Status: DC | PRN

## 2015-02-05 MED ORDER — BISACODYL 10 MG RE SUPP
10.0000 mg | Freq: Once | RECTAL | Status: AC
  Administered 2015-02-05: 10 mg via RECTAL
  Filled 2015-02-05: qty 1

## 2015-02-05 NOTE — Discharge Summary (Signed)
Date of admission: 02/04/2015  Date of discharge: 02/05/2015  Admission diagnosis: Prostate Cancer  Discharge diagnosis: Prostate Cancer  History and Physical: For full details, please see admission history and physical. Briefly, Patrick Pena is a 65 y.o. gentleman with localized prostate cancer.  After discussing management/treatment options, he elected to proceed with surgical treatment.  Hospital Course: Patrick Pena was taken to the operating room on 02/04/2015 and underwent a robotic assisted laparoscopic radical prostatectomy. He tolerated this procedure well and without complications. Postoperatively, he was able to be transferred to a regular hospital room following recovery from anesthesia.  He was able to begin ambulating the night of surgery. He remained hemodynamically stable overnight.  He had excellent urine output with appropriately minimal output from his pelvic drain and his pelvic drain was removed on POD #1.  He was transitioned to oral pain medication, tolerated a clear liquid diet, and had met all discharge criteria and was able to be discharged home later on POD#1. JP fluid was checked for creatinine and was c/w serum and removed prior to discharge.  Laboratory values:   Recent Labs  02/04/15 1220 02/05/15 0515  HGB 14.9 13.2  HCT 44.1 40.6   BP 104/53 mmHg  Pulse 65  Temp(Src) 98.4 F (36.9 C) (Oral)  Resp 16  Ht 5' 8.5" (1.74 m)  Wt 83.008 kg (183 lb)  BMI 27.42 kg/m2  SpO2 95%  Physical Exam:  Vital signs in last 24 hours: Temp:  [98.1 F (36.7 C)-98.5 F (36.9 C)] 98.4 F (36.9 C) (02/03 1423) Pulse Rate:  [63-84] 65 (02/03 0608) Resp:  [16-21] 16 (02/03 0608) BP: (104-153)/(50-81) 104/53 mmHg (02/03 0608) SpO2:  [95 %-100 %] 95 % (02/03 9532)  Constitutional:  Alert and oriented, No acute distress Cardiovascular: Regular rate and rhythm, No JVD Respiratory: Normal respiratory effort, SORA GI: Abdomen is soft, nontender, nondistended, no abdominal  masses Genitourinary: No CVAT. Foley clear yellow, JP removed Rectal: deferred Neurologic: Grossly intact, no focal deficits Psychiatric: Normal mood and affect   Disposition: Home  Discharge instruction: He was instructed to be ambulatory but to refrain from heavy lifting, strenuous activity, or driving. He was instructed on urethral catheter care.  Discharge medications:     Medication List    STOP taking these medications        ibuprofen 200 MG tablet  Commonly known as:  ADVIL,MOTRIN     multivitamin tablet      TAKE these medications        acetaminophen 500 MG tablet  Commonly known as:  TYLENOL  Take 1,000 mg by mouth every 6 (six) hours as needed for moderate pain.     DEEP BLUE RELIEF EX  Apply 1 application topically 2 (two) times daily as needed (muscle pain.).     FLUARIX QUADRIVALENT 0.5 ML injection  Generic drug:  Influenza vac split quadrivalent PF  TO BE ADMINISTERED BY PHARMACIST FOR IMMUNIZATION     HYDROcodone-acetaminophen 5-325 MG tablet  Commonly known as:  NORCO  Take 1-2 tablets by mouth every 6 (six) hours as needed.     lisinopril 5 MG tablet  Commonly known as:  PRINIVIL,ZESTRIL  TAKE 1 TABLET BY MOUTH DAILY     metoprolol tartrate 25 MG tablet  Commonly known as:  LOPRESSOR  TAKE 1/2 TABLET BY MOUTH TWICE A DAY     OVER THE COUNTER MEDICATION  Apply 1 application topically daily as needed (muscle pain.). Real time Menthol  ranitidine 300 MG capsule  Commonly known as:  ZANTAC  Take 300 mg by mouth daily as needed for heartburn.     sulfamethoxazole-trimethoprim 800-160 MG tablet  Commonly known as:  BACTRIM DS,SEPTRA DS  Take 1 tablet by mouth 2 (two) times daily. Start the day prior to foley removal appointment        Followup: He will followup in 1 week for catheter removal and to discuss his surgical pathology results.

## 2015-02-05 NOTE — Progress Notes (Signed)
1 Day Post-Op Subjective: The patient is doing well.  No nausea or vomiting. Pain is adequately controlled. Tolerating clears. Ambulated x5. Denies SOB, lightheadedness/dizziness. Moderate drain output decreased since MN (35 cc serosang), excellent UOP.   Objective: Vital signs in last 24 hours: Temp:  [98.1 F (36.7 C)-98.5 F (36.9 C)] 98.4 F (36.9 C) (02/03 0608) Pulse Rate:  [63-84] 65 (02/03 0608) Resp:  [16-21] 16 (02/03 0608) BP: (104-153)/(50-81) 104/53 mmHg (02/03 0608) SpO2:  [95 %-100 %] 95 % (02/03 [id number])  Intake/Output from previous day: 02/02 0701 - 02/03 0700 In: 5130 [P.O.:360; I.V.:4720; IV Piggyback:50] Out: 2710 [Urine:2350; Drains:210; Blood:150] Intake/Output this shift: Total I/O In: 2610 [P.O.:360; I.V.:2250] Out: 2410 [Urine:2350; Drains:60]  Physical Exam:  General: Alert and oriented. CV: RRR Lungs: Clear bilaterally. GI: Soft, Nondistended. Incisions: Clean, dry, and intact Urine: Clear JP: serosan Extremities: Nontender, no erythema, no edema.  Lab Results:  Recent Labs  02/04/15 1220 02/05/15 0515  HGB 14.9 13.2  HCT 44.1 40.6      Assessment/Plan: POD# 1 s/p robotic prostatectomy with BPLND and bladder neck tapering. Currently stable.  1) SL IVF 2) Ambulate, Incentive spirometry 3) Transition to oral pain medication 4) Dulcolax suppository 5) Send JP fluid for Cr, if no leak then D/C pelvic drain 6) Plan for likely discharge later today     LOS: 1 day   Star Age 02/05/2015, 6:53 AM

## 2015-02-17 ENCOUNTER — Inpatient Hospital Stay: Admit: 2015-02-17 | Payer: BC Managed Care – PPO | Admitting: Urology

## 2015-02-17 SURGERY — ROBOTIC ASSISTED LAPAROSCOPIC RADICAL PROSTATECTOMY
Anesthesia: Choice

## 2016-01-05 ENCOUNTER — Ambulatory Visit
Admission: EM | Admit: 2016-01-05 | Discharge: 2016-01-05 | Disposition: A | Discharge status: Home / Self Care | Payer: Medicare Other | Attending: Family Medicine | Admitting: Family Medicine

## 2016-01-05 ENCOUNTER — Encounter: Payer: Self-pay | Admitting: *Deleted

## 2016-01-05 DIAGNOSIS — J101 Influenza due to other identified influenza virus with other respiratory manifestations: Secondary | ICD-10-CM | POA: Diagnosis not present

## 2016-01-05 LAB — RAPID INFLUENZA A&B ANTIGENS (ARMC ONLY): INFLUENZA B (ARMC): NEGATIVE

## 2016-01-05 LAB — RAPID INFLUENZA A&B ANTIGENS: Influenza A (ARMC): POSITIVE — AB

## 2016-01-05 MED ORDER — ACETAMINOPHEN 500 MG PO TABS
1000.0000 mg | ORAL_TABLET | Freq: Once | ORAL | Status: AC
  Administered 2016-01-05: 1000 mg via ORAL

## 2016-01-05 MED ORDER — BENZONATATE 100 MG PO CAPS: 100.0000 mg | ORAL_CAPSULE | Freq: Three times a day (TID) | ORAL | 0 refills | Status: DC | PRN

## 2016-01-05 MED ORDER — OSELTAMIVIR PHOSPHATE 75 MG PO CAPS: 75.0000 mg | ORAL_CAPSULE | Freq: Two times a day (BID) | ORAL | 0 refills | Status: DC

## 2016-01-05 MED ORDER — ACETAMINOPHEN 500 MG PO TABS: 500.0000 mg | ORAL_TABLET | Freq: Once | ORAL | Status: DC

## 2016-01-05 NOTE — ED Provider Notes (Signed)
[facility]    CSN: [id number] Arrival date & time: 01/05/16  [id number]     History   Chief Complaint Chief Complaint  Patient presents with  . Cough  . Fever  . Nasal Congestion  . Headache    HPI Patrick Pena is a 66 y.o. male.   The history is provided by the patient.  Cough  Associated symptoms: fever, headaches and myalgias   Associated symptoms: no wheezing   Fever  Associated symptoms: congestion, cough, headaches and myalgias   Headache  Associated symptoms: congestion, cough, fever, myalgias and URI   Associated symptoms: no neck pain   URI  Presenting symptoms: congestion, cough and fever   Severity:  Moderate Onset quality:  Sudden Duration:  1 day Timing:  Constant Progression:  Worsening Chronicity:  New Relieved by:  None tried Associated symptoms: arthralgias, headaches and myalgias   Associated symptoms: no neck pain and no wheezing   Risk factors: being elderly   Risk factors: no chronic cardiac disease, no chronic kidney disease, no chronic respiratory disease, no diabetes mellitus, no recent illness, no recent travel and no sick contacts     Past Medical History:  Diagnosis Date  . Arthritis   . Blood pressure elevated 05/12/2014  . BP (high blood pressure) 03/13/2013  . Cancer [facility])    prostate cancer   . 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 GERD (gastroesophageal reflux disease)   . Hypercholesteremia   . Hypercholesterolemia 03/13/2013  . Hypertension     Patient Active Problem List   Diagnosis Date Noted  . Prostate cancer ([place]) 02/04/2015  . Malignant neoplasm of prostate ([place]) 12/31/2014  . Blood pressure elevated 05/12/2014  . 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 Hypercholesterolemia 03/13/2013  . BP (high blood pressure) 03/13/2013    Past Surgical History:  Procedure  Laterality Date  . LYMPHADENECTOMY Bilateral 02/04/2015   Procedure: BILATERAL PELVIC LYMPHADENECTOMY;  Surgeon: Raynelle Bring, MD;  Location: [facility];  Service: Urology;  Laterality: Bilateral;  . NO PAST SURGERIES    . ROBOT ASSISTED LAPAROSCOPIC RADICAL PROSTATECTOMY N/A 02/04/2015   Procedure: XI ROBOTIC ASSISTED LAPAROSCOPIC RADICAL PROSTATECTOMY LEVEL 2;  Surgeon: Raynelle Bring, MD;  Location: [facility];  Service: Urology;  Laterality: N/A;       Home Medications    Prior to Admission medications   Medication Sig Start Date End Date Taking? Authorizing Provider  acetaminophen (TYLENOL) 500 MG tablet Take 1,000 mg by mouth every 6 (six) hours as needed for moderate pain.   Yes Historical Provider, MD  lisinopril (PRINIVIL,ZESTRIL) 5 MG tablet TAKE 1 TABLET BY MOUTH DAILY 11/04/14  Yes Juline Patch, MD  metoprolol tartrate (LOPRESSOR) 25 MG tablet TAKE 1/2 TABLET BY MOUTH TWICE A DAY 11/04/14  Yes Juline Patch, MD  ranitidine (ZANTAC) 300 MG capsule Take 300 mg by mouth daily as needed for heartburn.    Yes Historical Provider, MD  benzonatate (TESSALON) 100 MG capsule Take 1 capsule (100 mg total) by mouth 3 (three) times daily as needed for cough. 01/05/16   Norval Gable, MD  FLUARIX QUADRIVALENT 0.5 ML injection TO BE ADMINISTERED BY PHARMACIST FOR IMMUNIZATION 01/07/15   Historical Provider, MD  HYDROcodone-acetaminophen (NORCO) 5-325 MG tablet Take 1-2 tablets by mouth every 6 (six) hours as needed. 02/04/15   Debbrah Alar, PA-C  Liniments (DEEP  BLUE RELIEF EX) Apply 1 application topically 2 (two) times daily as needed (muscle pain.).    Historical Provider, MD  oseltamivir (TAMIFLU) 75 MG capsule Take 1 capsule (75 mg total) by mouth 2 (two) times daily. 01/05/16   Norval Gable, MD  OVER THE COUNTER MEDICATION Apply 1 application topically daily as needed (muscle pain.). Real time Menthol    Historical Provider, MD  sulfamethoxazole-trimethoprim (BACTRIM DS,SEPTRA DS) 800-160 MG tablet Take 1  tablet by mouth 2 (two) times daily. Start the day prior to foley removal appointment 02/04/15   Debbrah Alar, PA-C    Family History Family History  Problem Relation Age of Onset  . Prostate cancer Neg Hx   . Kidney cancer Neg Hx   . Tuberculosis Neg Hx     Social History Social History  Substance Use Topics  . Smoking status: Never Smoker  . Smokeless tobacco: Never Used  . Alcohol use Yes     Comment: 2-4 ounces almost every day      Allergies   Atorvastatin   Review of Systems Review of Systems  Constitutional: Positive for fever.  HENT: Positive for congestion.   Respiratory: Positive for cough. Negative for wheezing.   Musculoskeletal: Positive for arthralgias and myalgias. Negative for neck pain.  Neurological: Positive for headaches.     Physical Exam Triage Vital Signs ED Triage Vitals  Enc Vitals Group     BP 01/05/16 0840 (!) 157/63     Pulse Rate 01/05/16 0840 (!) 102     Resp 01/05/16 0840 16     Temp 01/05/16 0840 100.3 F (37.9 C)     Temp Source 01/05/16 0840 Oral     SpO2 01/05/16 0840 97 %     Weight 01/05/16 0842 185 lb (83.9 kg)     Height 01/05/16 0842 5\' 9"  (1.753 m)     Head Circumference --      Peak Flow --      Pain Score 01/05/16 0914 0     Pain Loc --      Pain Edu? --      Excl. in [place]? --    No data found.   Updated Vital Signs BP (!) 157/63 (BP Location: Left Arm)   Pulse (!) 102   Temp 100.3 F (37.9 C) (Oral)   Resp 16   Ht 5\' 9"  (1.753 m)   Wt 185 lb (83.9 kg)   SpO2 97%   BMI 27.32 kg/m   Visual Acuity Right Eye Distance:   Left Eye Distance:   Bilateral Distance:    Right Eye Near:   Left Eye Near:    Bilateral Near:     Physical Exam  Constitutional: He appears well-developed and well-nourished. No distress.  HENT:  Head: Normocephalic and atraumatic.  Right Ear: Tympanic membrane, external ear and ear canal normal.  Left Ear: Tympanic membrane, external ear and ear canal normal.  Nose: Nose normal.   Mouth/Throat: Uvula is midline, oropharynx is clear and moist and mucous membranes are normal. No oropharyngeal exudate or tonsillar abscesses.  Eyes: Conjunctivae and EOM are normal. Pupils are equal, round, and reactive to light. Right eye exhibits no discharge. Left eye exhibits no discharge. No scleral icterus.  Neck: Normal range of motion. Neck supple. No tracheal deviation present. No thyromegaly present.  Cardiovascular: Normal rate, regular rhythm and normal heart sounds.   Pulmonary/Chest: Effort normal and breath sounds normal. No stridor. No respiratory distress. He has no wheezes. He has  no rales. He exhibits no tenderness.  Lymphadenopathy:    He has no cervical adenopathy.  Neurological: He is alert.  Skin: Skin is warm and dry. No rash noted. He is not diaphoretic.  Nursing note and vitals reviewed.    UC Treatments / Results  Labs (all labs ordered are listed, but only abnormal results are displayed) Labs Reviewed  RAPID INFLUENZA A&B ANTIGENS ([place]) - Abnormal; Notable for the following:       Result Value   Influenza A ([facility]) POSITIVE (*)    All other components within normal limits    EKG  EKG Interpretation None       Radiology No results found.  Procedures Procedures (including critical care time)  Medications Ordered in UC Medications  acetaminophen (TYLENOL) tablet 1,000 mg (1,000 mg Oral Given 01/05/16 0911)     Initial Impression / Assessment and Plan / UC Course  I have reviewed the triage vital signs and the nursing notes.  Pertinent labs & imaging results that were available during my care of the patient were reviewed by me and considered in my medical decision making (see chart for details).  Clinical Course       Final Clinical Impressions(s) / UC Diagnoses   Final diagnoses:  Influenza A    New Prescriptions Discharge Medication List as of 01/05/2016  9:12 AM    START taking these medications   Details  benzonatate  (TESSALON) 100 MG capsule Take 1 capsule (100 mg total) by mouth 3 (three) times daily as needed for cough., Starting Wed 01/05/2016, Normal    oseltamivir (TAMIFLU) 75 MG capsule Take 1 capsule (75 mg total) by mouth 2 (two) times daily., Starting Wed 01/05/2016, Normal        1. Lab results (flu test positive) and diagnosis reviewed with patient 2. rx as per orders above; reviewed possible side effects, interactions, risks and benefits  3. Recommend supportive treatment with rest, fluids 4. Follow-up prn if symptoms worsen or don't improve   Norval Gable, MD 01/05/16 [phone]

## 2016-01-05 NOTE — ED Triage Notes (Signed)
Productive cough- tan/green, fever, headache, body aches, chills, onset yesterday.

## 2017-03-30 IMAGING — CR DG CHEST 2V
2 series · 2 of 2 positions shown · non-contrast
Comparison: Prior chest x-ray 03/26/2012

CLINICAL DATA: 64-year-old male undergoing planned prostatectomy.
Preoperative evaluation.

EXAM:
CHEST  2 VIEW

[w chest pa]
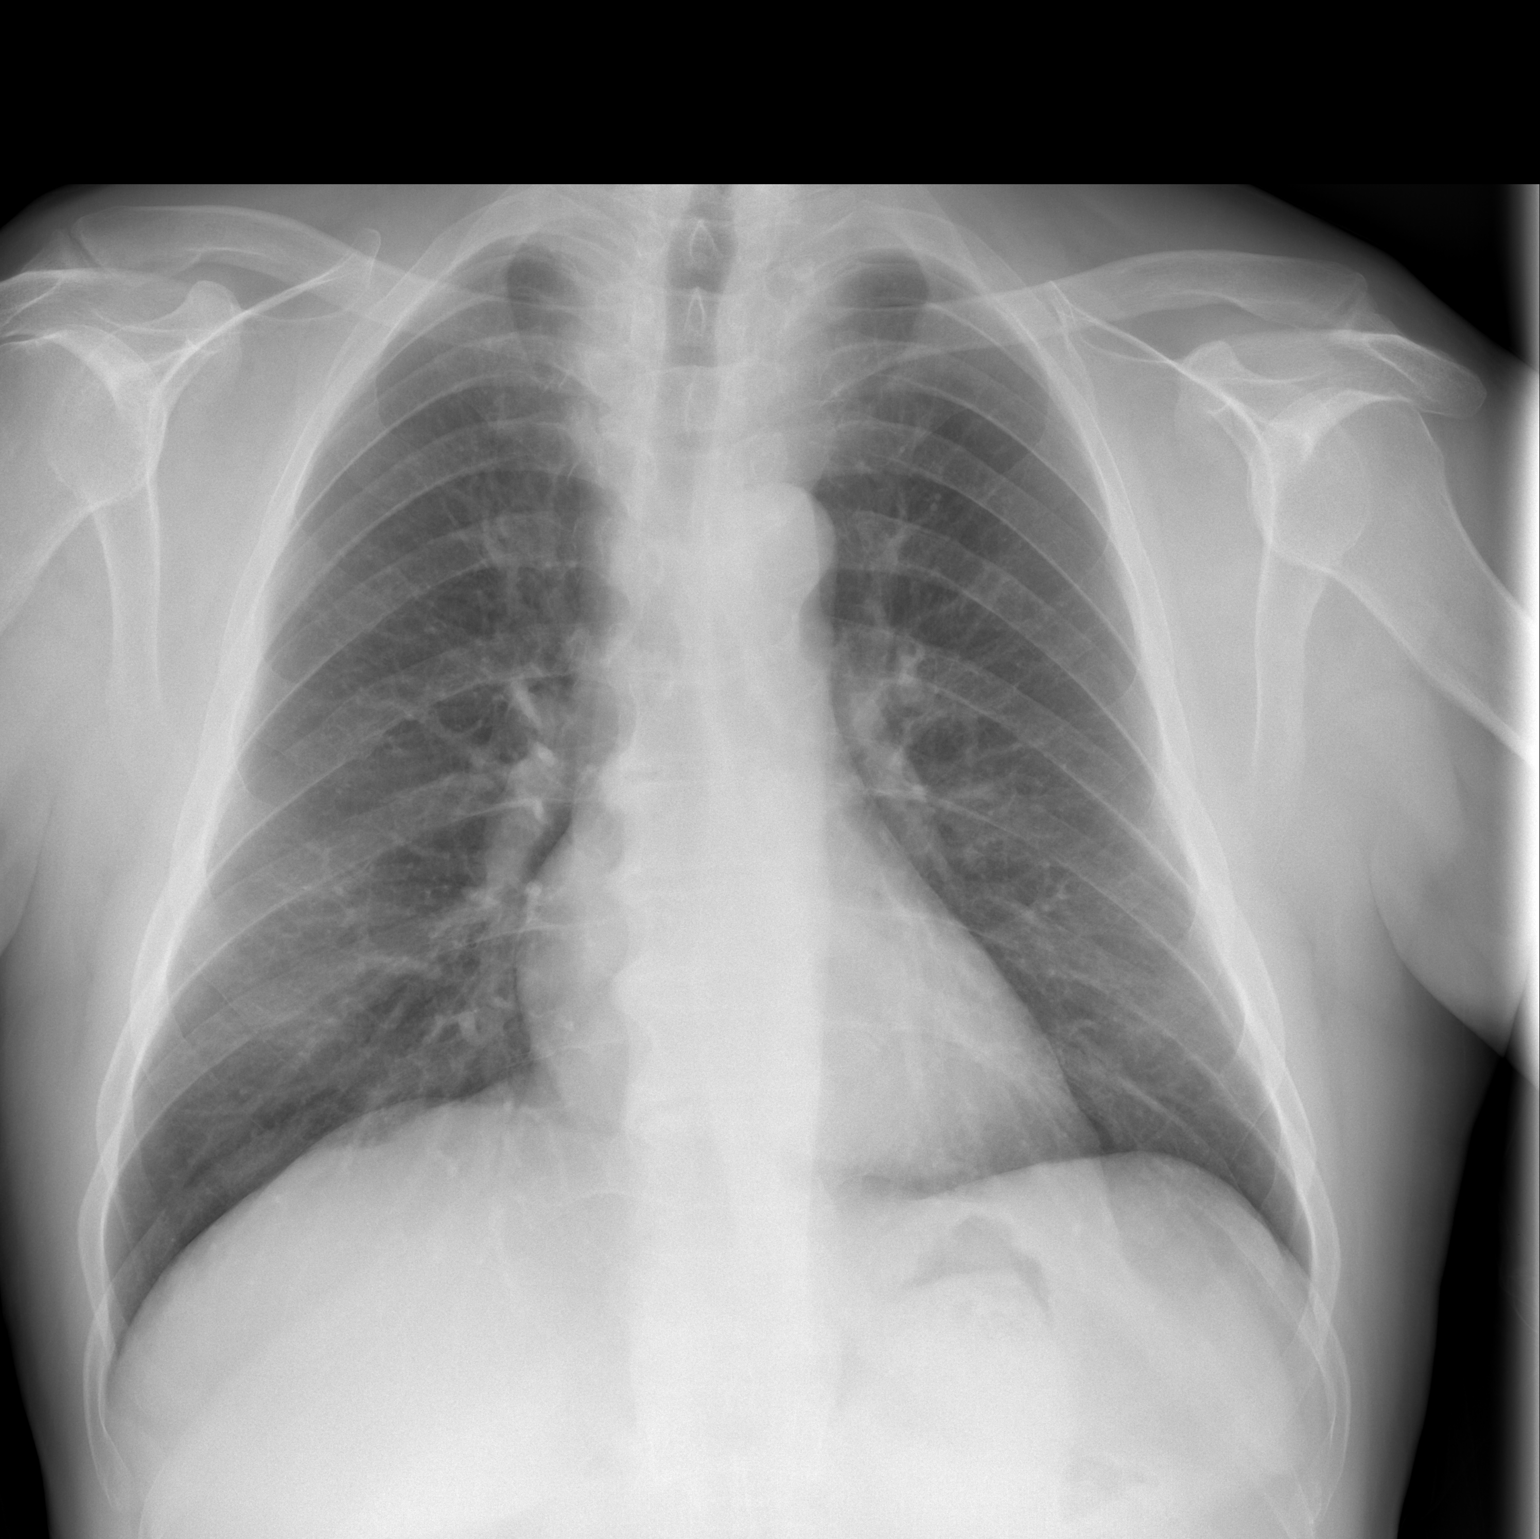

[w chest lat]
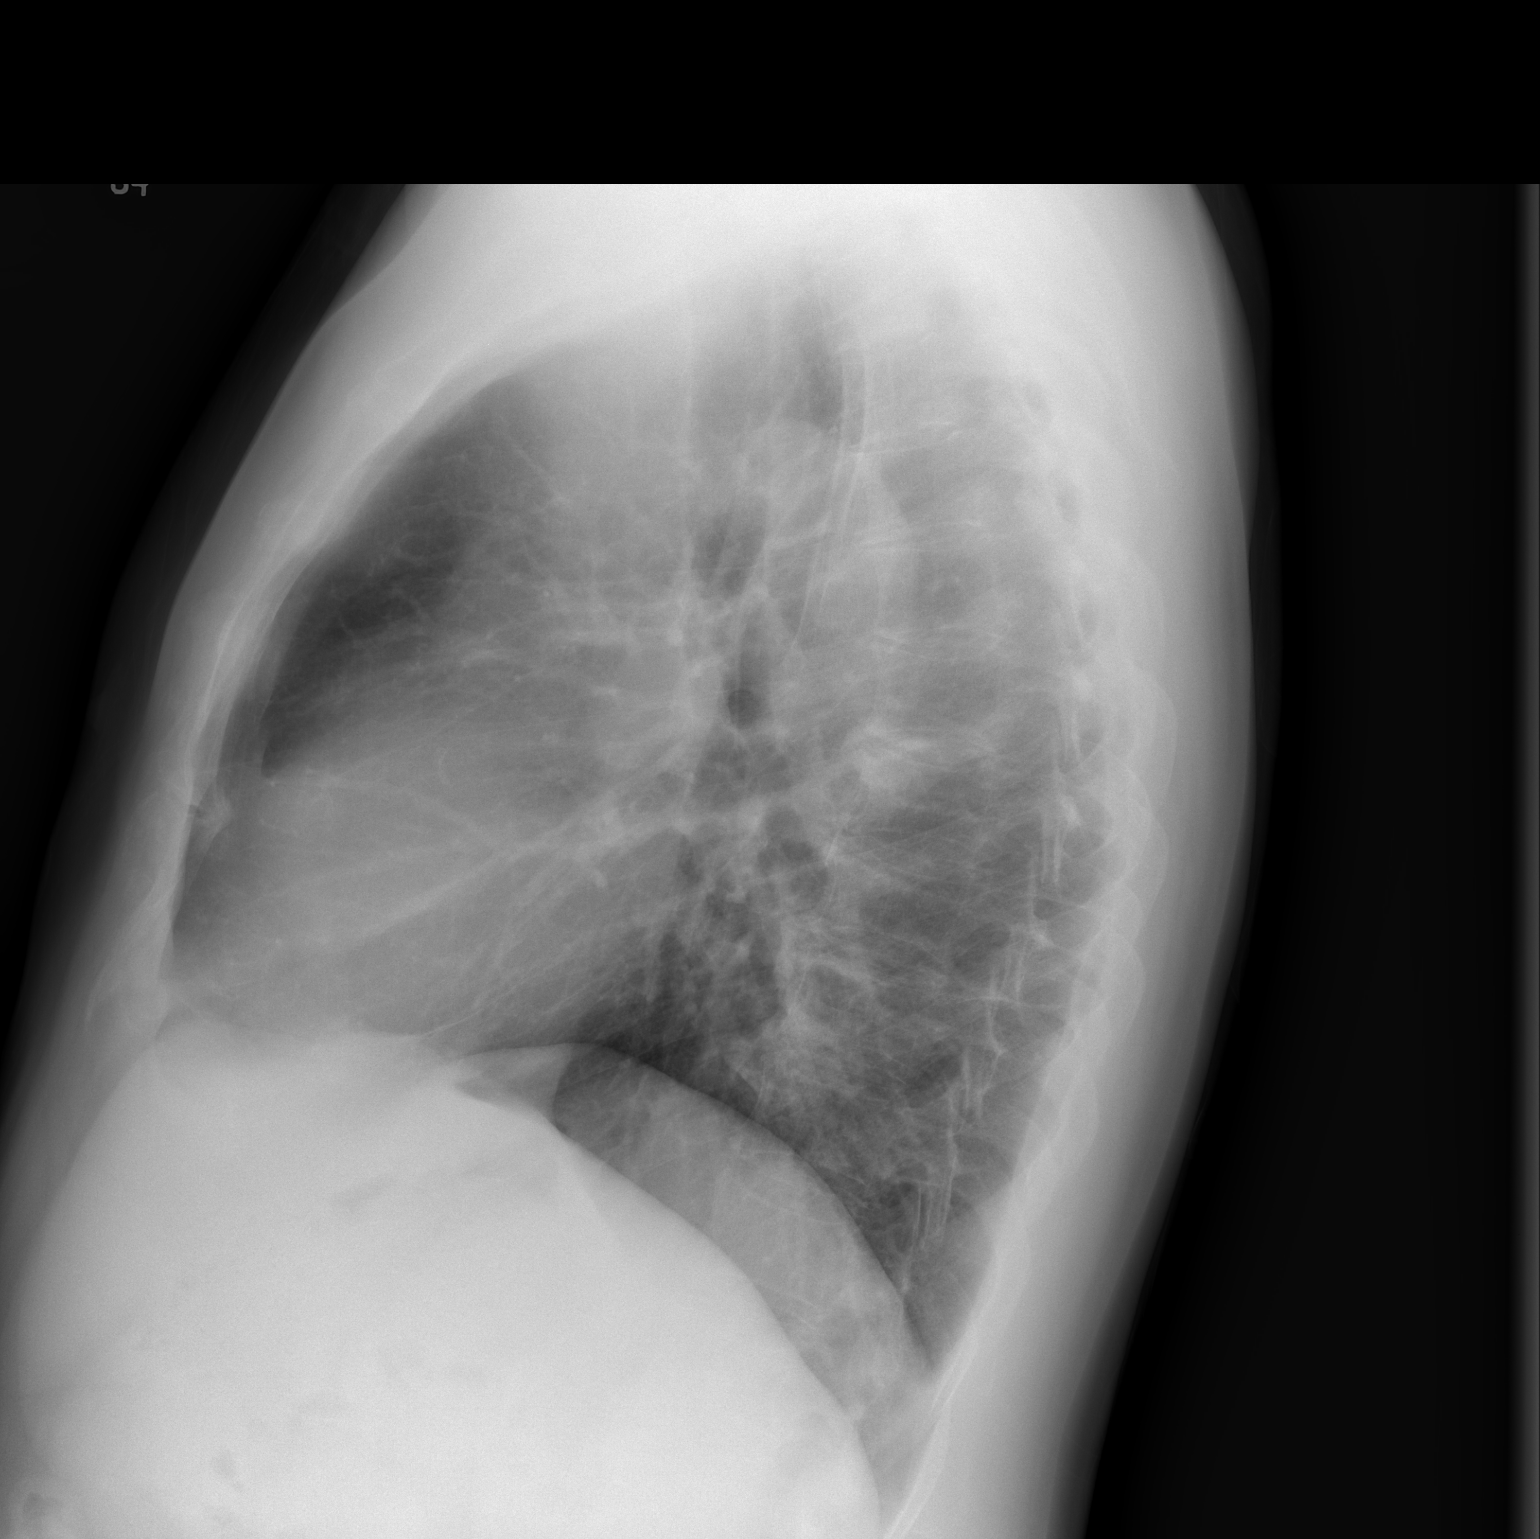

[2 of 2 positions shown; findings below may reference images not displayed]

FINDINGS: The lungs are clear and negative for focal airspace consolidation,
pulmonary edema or suspicious pulmonary nodule. No pleural effusion
or pneumothorax. Cardiac and mediastinal contours are within normal
limits. No acute fracture or lytic or blastic osseous lesions. The
visualized upper abdominal bowel gas pattern is unremarkable.
IMPRESSION: Negative chest x-ray.

## 2017-12-24 ENCOUNTER — Other Ambulatory Visit: Payer: Self-pay | Admitting: Family Medicine

## 2017-12-24 DIAGNOSIS — R0989 Other specified symptoms and signs involving the circulatory and respiratory systems: Secondary | ICD-10-CM

## 2017-12-31 ENCOUNTER — Other Ambulatory Visit: Payer: Self-pay | Admitting: Family Medicine

## 2017-12-31 DIAGNOSIS — R0989 Other specified symptoms and signs involving the circulatory and respiratory systems: Secondary | ICD-10-CM

## 2018-01-08 ENCOUNTER — Ambulatory Visit
Admission: RE | Admit: 2018-01-08 | Discharge: 2018-01-08 | Disposition: A | Discharge status: Home / Self Care | Payer: Medicare Other | Source: Ambulatory Visit | Attending: Family Medicine | Admitting: Family Medicine

## 2018-01-08 DIAGNOSIS — R0989 Other specified symptoms and signs involving the circulatory and respiratory systems: Secondary | ICD-10-CM | POA: Diagnosis not present

## 2019-06-25 ENCOUNTER — Emergency Department: Payer: Medicare PPO

## 2019-06-25 ENCOUNTER — Other Ambulatory Visit: Payer: Self-pay

## 2019-06-25 ENCOUNTER — Telehealth: Payer: Self-pay | Admitting: Internal Medicine

## 2019-06-25 ENCOUNTER — Emergency Department
Admission: EM | Admit: 2019-06-25 | Discharge: 2019-06-25 | Disposition: A | Discharge status: Home / Self Care | Payer: Medicare PPO | Attending: Emergency Medicine | Admitting: Emergency Medicine

## 2019-06-25 ENCOUNTER — Encounter: Payer: Self-pay | Admitting: Emergency Medicine

## 2019-06-25 DIAGNOSIS — I10 Essential (primary) hypertension: Secondary | ICD-10-CM | POA: Insufficient documentation

## 2019-06-25 DIAGNOSIS — R1900 Intra-abdominal and pelvic swelling, mass and lump, unspecified site: Secondary | ICD-10-CM | POA: Diagnosis not present

## 2019-06-25 DIAGNOSIS — M25531 Pain in right wrist: Secondary | ICD-10-CM | POA: Insufficient documentation

## 2019-06-25 DIAGNOSIS — Y929 Unspecified place or not applicable: Secondary | ICD-10-CM | POA: Diagnosis not present

## 2019-06-25 DIAGNOSIS — M25551 Pain in right hip: Secondary | ICD-10-CM | POA: Diagnosis present

## 2019-06-25 DIAGNOSIS — S32511A Fracture of superior rim of right pubis, initial encounter for closed fracture: Secondary | ICD-10-CM | POA: Diagnosis not present

## 2019-06-25 DIAGNOSIS — W11XXXA Fall on and from ladder, initial encounter: Secondary | ICD-10-CM | POA: Insufficient documentation

## 2019-06-25 DIAGNOSIS — Z8546 Personal history of malignant neoplasm of prostate: Secondary | ICD-10-CM | POA: Insufficient documentation

## 2019-06-25 DIAGNOSIS — W19XXXA Unspecified fall, initial encounter: Secondary | ICD-10-CM

## 2019-06-25 DIAGNOSIS — Y939 Activity, unspecified: Secondary | ICD-10-CM | POA: Insufficient documentation

## 2019-06-25 DIAGNOSIS — Y999 Unspecified external cause status: Secondary | ICD-10-CM | POA: Diagnosis not present

## 2019-06-25 DIAGNOSIS — S329XXA Fracture of unspecified parts of lumbosacral spine and pelvis, initial encounter for closed fracture: Secondary | ICD-10-CM

## 2019-06-25 HISTORY — DX: Fracture of unspecified parts of lumbosacral spine and pelvis, initial encounter for closed fracture: S32.9XXA

## 2019-06-25 LAB — COMPREHENSIVE METABOLIC PANEL
ALT: 26 U/L (ref 0–44)
AST: 36 U/L (ref 15–41)
Albumin: 4.4 g/dL (ref 3.5–5.0)
Alkaline Phosphatase: 63 U/L (ref 38–126)
Anion gap: 11 (ref 5–15)
BUN: 21 mg/dL (ref 8–23)
CO2: 25 mmol/L (ref 22–32)
Calcium: 9.4 mg/dL (ref 8.9–10.3)
Chloride: 100 mmol/L (ref 98–111)
Creatinine, Ser: 1.06 mg/dL (ref 0.61–1.24)
GFR calc Af Amer: >60 mL/min (ref >60)
GFR calc non Af Amer: >60 mL/min (ref >60)
Glucose, Bld: 109 mg/dL — ABNORMAL HIGH (ref 70–99)
Potassium: 3.8 mmol/L (ref 3.5–5.1)
Sodium: 136 mmol/L (ref 135–145)
Total Bilirubin: 0.9 mg/dL (ref 0.3–1.2)
Total Protein: 7.3 g/dL (ref 6.5–8.1)

## 2019-06-25 LAB — CBC WITH DIFFERENTIAL/PLATELET
Abs Immature Granulocytes: 0.19 10*3/uL — ABNORMAL HIGH (ref 0.00–0.07)
Basophils Absolute: 0.1 10*3/uL (ref 0.0–0.1)
Basophils Relative: 1 %
Eosinophils Absolute: 0.6 10*3/uL — ABNORMAL HIGH (ref 0.0–0.5)
Eosinophils Relative: 4 %
HCT: 47.2 % (ref 39.0–52.0)
Hemoglobin: 16.1 g/dL (ref 13.0–17.0)
Immature Granulocytes: 1 %
Lymphocytes Relative: 13 %
Lymphs Abs: 2 10*3/uL (ref 0.7–4.0)
MCH: 31.7 pg (ref 26.0–34.0)
MCHC: 34.1 g/dL (ref 30.0–36.0)
MCV: 92.9 fL (ref 80.0–100.0)
Monocytes Absolute: 1.3 10*3/uL — ABNORMAL HIGH (ref 0.1–1.0)
Monocytes Relative: 8 %
Neutro Abs: 11.5 10*3/uL — ABNORMAL HIGH (ref 1.7–7.7)
Neutrophils Relative %: 73 %
Platelets: 294 10*3/uL (ref 150–400)
RBC: 5.08 MIL/uL (ref 4.22–5.81)
RDW: 12.2 % (ref 11.5–15.5)
WBC: 15.7 10*3/uL — ABNORMAL HIGH (ref 4.0–10.5)
nRBC: 0 % (ref 0.0–0.2)

## 2019-06-25 LAB — TYPE AND SCREEN
ABO/RH(D): B POS
Antibody Screen: NEGATIVE

## 2019-06-25 MED ORDER — IOHEXOL 300 MG/ML  SOLN
100.0000 mL | Freq: Once | INTRAMUSCULAR | Status: AC | PRN
  Administered 2019-06-25: 100 mL via INTRAVENOUS

## 2019-06-25 MED ORDER — DOCUSATE SODIUM 100 MG PO CAPS
100.0000 mg | ORAL_CAPSULE | Freq: Two times a day (BID) | ORAL | 2 refills | Status: DC
Start: 2019-06-25 — End: 2019-08-12

## 2019-06-25 MED ORDER — HYDROMORPHONE HCL 1 MG/ML IJ SOLN
0.5000 mg | Freq: Once | INTRAMUSCULAR | Status: AC
  Administered 2019-06-25: 0.5 mg via INTRAVENOUS
  Filled 2019-06-25: qty 1

## 2019-06-25 MED ORDER — OXYCODONE-ACETAMINOPHEN 5-325 MG PO TABS: 1.0000 | ORAL_TABLET | Freq: Three times a day (TID) | ORAL | 0 refills | Status: DC | PRN

## 2019-06-25 MED ORDER — DOCUSATE SODIUM 100 MG PO CAPS: 100.0000 mg | ORAL_CAPSULE | Freq: Two times a day (BID) | ORAL | 2 refills | Status: DC

## 2019-06-25 MED ORDER — METOPROLOL TARTRATE 5 MG/5ML IV SOLN: 5.0000 mg | Freq: Once | INTRAVENOUS | Status: DC

## 2019-06-25 NOTE — Telephone Encounter (Signed)
On 6/23-spoke to Dr. Jimmye Norman ER-abdominal mass; history of prostate cancer.  Melissa-schedule appointment on june25th/friday-830- MD; Dr.B

## 2019-06-25 NOTE — ED Notes (Signed)
Pt to CT

## 2019-06-25 NOTE — ED Provider Notes (Signed)
ER Provider Note       Time seen: 4:24 PM    I have reviewed the vital signs and the nursing notes.  HISTORY   Chief Complaint Fall    HPI AXTON CIHLAR is a 69 y.o. male with a history of arthritis, hypertension, hyperlipidemia who presents today for a fall.  Patient was about 8 feet up on a ladder around 3 to 4 hours ago.  He sustained a fall and injured the right hip and right leg.  He has some right foot numbness.  Has been unable to bear weight since the fall on his right leg.  Also complains of right wrist pain.  Discomfort is 8 out of 10.  Past Medical History:  Diagnosis Date  . Arthritis   . Blood pressure elevated 05/12/2014  . BP (high blood pressure) 03/13/2013  . Cancer [facility])    prostate cancer   . 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 GERD (gastroesophageal reflux disease)   . Hypercholesteremia   . Hypercholesterolemia 03/13/2013  . Hypertension     Past Surgical History:  Procedure Laterality Date  . LYMPHADENECTOMY Bilateral 02/04/2015   Procedure: BILATERAL PELVIC LYMPHADENECTOMY;  Surgeon: Raynelle Bring, MD;  Location: [facility];  Service: Urology;  Laterality: Bilateral;  . NO PAST SURGERIES    . ROBOT ASSISTED LAPAROSCOPIC RADICAL PROSTATECTOMY N/A 02/04/2015   Procedure: XI ROBOTIC ASSISTED LAPAROSCOPIC RADICAL PROSTATECTOMY LEVEL 2;  Surgeon: Raynelle Bring, MD;  Location: [facility];  Service: Urology;  Laterality: N/A;    Allergies Atorvastatin  Review of Systems Constitutional: Negative for fever. Cardiovascular: Negative for chest pain. Respiratory: Negative for shortness of breath. Gastrointestinal: Negative for abdominal pain, vomiting and diarrhea. Musculoskeletal: Positive for right hip pain, right wrist pain Skin: Negative for rash. Neurological: Negative for headaches, focal weakness or numbness.  All systems negative/normal/unremarkable except as stated in the  HPI  ____________________________________________   PHYSICAL EXAM:  VITAL SIGNS: There were no vitals filed for this visit.  Constitutional: Alert and oriented. Well appearing and in no distress. Eyes: Conjunctivae are normal. Normal extraocular movements. ENT      Head: Normocephalic and atraumatic.      Nose: No congestion/rhinnorhea.      Mouth/Throat: Mucous membranes are moist.      Neck: No stridor. Cardiovascular: Normal rate, regular rhythm. No murmurs, rubs, or gallops. Respiratory: Normal respiratory effort without tachypnea nor retractions. Breath sounds are clear and equal bilaterally. No wheezes/rales/rhonchi. Gastrointestinal: Soft and nontender. Normal bowel sounds Musculoskeletal: Nontender with normal range of motion in extremities. No lower extremity tenderness nor edema. Neurologic:  Normal speech and language. No gross focal neurologic deficits are appreciated.  Skin:  Skin is warm, dry and intact. No rash noted. Psychiatric: Speech and behavior are normal.   ____________________________________________   LABS (pertinent positives/negatives)  Labs Reviewed  CBC WITH DIFFERENTIAL/PLATELET - Abnormal; Notable for the following components:      Result Value   WBC 15.7 (*)    Neutro Abs 11.5 (*)    Monocytes Absolute 1.3 (*)    Eosinophils Absolute 0.6 (*)    Abs Immature Granulocytes 0.19 (*)    All other components within normal limits  COMPREHENSIVE METABOLIC PANEL - Abnormal; Notable for the following components:   Glucose, Bld 109 (*)    All other components within normal limits  TYPE AND SCREEN  TYPE AND SCREEN    RADIOLOGY  Images were viewed  by me Right femur x-ray, right wrist x-ray/CT pelvis IMPRESSION: 1. Nondisplaced fractures of the right inferior pubic ramus and left pubic bone adjacent to the symphysis pubis. No dislocation. 2. Nondisplaced longitudinal fracture of the inferior aspect of the right sacral ala. 3. Partially visualized  soft tissue mass anterior to the IVC with additional enlarged mesenteric lymph nodes within the upper pelvis which have increased in size since the prior CT. CT of the abdomen recommended for complete evaluation. 4. Aortic Atherosclerosis (ICD10-I70.0). IMPRESSION: No acute osseous abnormality. IMPRESSION: 1. Acute nondisplaced mildly comminuted fracture of the right inferior pubic ramus. 2. Subtle nondisplaced fracture of the superior aspect of the left pubic body. 3. A 3.5 x 3.1 cm soft tissue mass right anterolateral to the SVC within the root of the mesentery. Mild mesenteric lymphadenopathy. Differential considerations include malignancy such as metastatic disease or lymphoma. Recommend oncology consultation. 4. Aortic Atherosclerosis (ICD10-I70.0).  DIFFERENTIAL DIAGNOSIS  Fall, contusion, fracture, dislocation  ASSESSMENT AND PLAN  Fall, right inferior pubic ramus fracture, left pubic bone fracture, sacral ala fracture intra-abdominal mass   Plan: The patient had presented for a fall. Patient's labs did indicate leukocytosis but no other acute process.  CT revealed right inferior pubic ramus fracture as well as left pubic bone and right sacral alar fracture.  CT of the abdomen and pelvis was performed because there appeared to be a soft tissue mass in the abdomen.  I have discussed with oncology on-call Dr. Rogue Bussing who will see him in close outpatient follow-up.  Dr. Roland Rack from orthopedics was consulted and confirmed these are weightbearing fractures.  He was able to ambulate with a walker here.  He will be discharged with pain medicine and stool softeners.  Lenise Arena MD    Note: This note was generated in part or whole with voice recognition software. Voice recognition is usually quite accurate but there are transcription errors that can and very often do occur. I apologize for any typographical errors that were not detected and corrected.     Earleen Newport, MD 06/25/19 2043

## 2019-06-25 NOTE — ED Notes (Signed)
Report to Butch, RN 

## 2019-06-25 NOTE — ED Triage Notes (Signed)
Fall from 8 foot ladder about 3.52 hours ago.  C/O right hip pain and some right foot numbness.  States unable to bear weight since fall.  Denies LOC.

## 2019-06-26 ENCOUNTER — Encounter: Payer: Self-pay | Admitting: *Deleted

## 2019-06-26 ENCOUNTER — Other Ambulatory Visit: Payer: Self-pay

## 2019-06-26 NOTE — Telephone Encounter (Signed)
Apt has been scheduled.

## 2019-06-26 NOTE — Progress Notes (Signed)
Contacted patient prior to his NP apt with Dr. Rogue Bussing. Patient reports that he fell off a ladder yesterday and injury yielded pelvic fracture. He is currently ambulating on crutches. Patient is being referred back to cancer center for history of prostate cancer. Previously treated under the care of Dr. Massie Maroon.

## 2019-06-27 ENCOUNTER — Encounter: Payer: Self-pay | Admitting: Internal Medicine

## 2019-06-27 ENCOUNTER — Inpatient Hospital Stay: Payer: Medicare PPO

## 2019-06-27 ENCOUNTER — Inpatient Hospital Stay: Payer: Medicare PPO | Attending: Internal Medicine | Admitting: Internal Medicine

## 2019-06-27 ENCOUNTER — Other Ambulatory Visit: Payer: Self-pay

## 2019-06-27 ENCOUNTER — Other Ambulatory Visit: Payer: Self-pay | Admitting: Licensed Clinical Social Worker

## 2019-06-27 DIAGNOSIS — R7989 Other specified abnormal findings of blood chemistry: Secondary | ICD-10-CM | POA: Diagnosis not present

## 2019-06-27 DIAGNOSIS — K6389 Other specified diseases of intestine: Secondary | ICD-10-CM | POA: Insufficient documentation

## 2019-06-27 DIAGNOSIS — Z79899 Other long term (current) drug therapy: Secondary | ICD-10-CM | POA: Insufficient documentation

## 2019-06-27 DIAGNOSIS — Z8249 Family history of ischemic heart disease and other diseases of the circulatory system: Secondary | ICD-10-CM

## 2019-06-27 DIAGNOSIS — Z8051 Family history of malignant neoplasm of kidney: Secondary | ICD-10-CM | POA: Insufficient documentation

## 2019-06-27 DIAGNOSIS — I10 Essential (primary) hypertension: Secondary | ICD-10-CM | POA: Diagnosis not present

## 2019-06-27 DIAGNOSIS — Z7982 Long term (current) use of aspirin: Secondary | ICD-10-CM | POA: Diagnosis not present

## 2019-06-27 DIAGNOSIS — Z8349 Family history of other endocrine, nutritional and metabolic diseases: Secondary | ICD-10-CM | POA: Diagnosis not present

## 2019-06-27 DIAGNOSIS — D72829 Elevated white blood cell count, unspecified: Secondary | ICD-10-CM | POA: Insufficient documentation

## 2019-06-27 DIAGNOSIS — E7801 Familial hypercholesterolemia: Secondary | ICD-10-CM | POA: Insufficient documentation

## 2019-06-27 DIAGNOSIS — R1907 Generalized intra-abdominal and pelvic swelling, mass and lump: Secondary | ICD-10-CM | POA: Diagnosis not present

## 2019-06-27 DIAGNOSIS — K219 Gastro-esophageal reflux disease without esophagitis: Secondary | ICD-10-CM

## 2019-06-27 DIAGNOSIS — M8580 Other specified disorders of bone density and structure, unspecified site: Secondary | ICD-10-CM | POA: Diagnosis not present

## 2019-06-27 DIAGNOSIS — Z833 Family history of diabetes mellitus: Secondary | ICD-10-CM | POA: Diagnosis not present

## 2019-06-27 DIAGNOSIS — C61 Malignant neoplasm of prostate: Secondary | ICD-10-CM | POA: Insufficient documentation

## 2019-06-27 DIAGNOSIS — Z9079 Acquired absence of other genital organ(s): Secondary | ICD-10-CM | POA: Diagnosis not present

## 2019-06-27 LAB — CBC WITH DIFFERENTIAL/PLATELET
Abs Immature Granulocytes: 0.03 10*3/uL (ref 0.00–0.07)
Basophils Absolute: 0.1 10*3/uL (ref 0.0–0.1)
Basophils Relative: 1 %
Eosinophils Absolute: 0.9 10*3/uL — ABNORMAL HIGH (ref 0.0–0.5)
Eosinophils Relative: 8 %
HCT: 42.5 % (ref 39.0–52.0)
Hemoglobin: 14.6 g/dL (ref 13.0–17.0)
Immature Granulocytes: 0 %
Lymphocytes Relative: 13 %
Lymphs Abs: 1.4 10*3/uL (ref 0.7–4.0)
MCH: 31.7 pg (ref 26.0–34.0)
MCHC: 34.4 g/dL (ref 30.0–36.0)
MCV: 92.2 fL (ref 80.0–100.0)
Monocytes Absolute: 1.1 10*3/uL — ABNORMAL HIGH (ref 0.1–1.0)
Monocytes Relative: 10 %
Neutro Abs: 7.3 10*3/uL (ref 1.7–7.7)
Neutrophils Relative %: 68 %
Platelets: 227 10*3/uL (ref 150–400)
RBC: 4.61 MIL/uL (ref 4.22–5.81)
RDW: 12.4 % (ref 11.5–15.5)
WBC: 10.8 10*3/uL — ABNORMAL HIGH (ref 4.0–10.5)
nRBC: 0 % (ref 0.0–0.2)

## 2019-06-27 LAB — LACTATE DEHYDROGENASE: LDH: 216 U/L — ABNORMAL HIGH (ref 98–192)

## 2019-06-27 LAB — PSA: Prostatic Specific Antigen: <0.01 ng/mL (ref 0.00–4.00)

## 2019-06-27 NOTE — Assessment & Plan Note (Addendum)
#  Incidentally noted mesenteric mass 3.5 cm-close to IVC.  This is concerning for malignancy-differential diagnosis includes-lymphoma; metastatic disease-carcinoid versus prostate [less likely] versus other-malignancies.  Benign causes are also possible.  Recommend a PET scan.  Will discuss with the tumor conference.  #Discussed the role of further imaging; biopsy for further characterization.  #Leukocytosis 15,000 neutrophilia/positive for immature granulocytes-check CBC  #Prostate cancer-s/p RP [2017].  Check PSA.  #Pelvic fracture s/p fall-conservative management/as per ortho  Thank you Dr.Williams  for allowing me to participate in the care of your pleasant patient. Please do not hesitate to contact me with questions or concerns in the interim.   # DISPOSITION: call wife.  # PET ASAP #Labs today-PSA; LDH CBC.  chromogranin A; #Follow-up to be decided-Dr. B  # I reviewed the blood work- with the patient in detail; also reviewed the imaging independently [as summarized above]; and with the patient in detail.

## 2019-06-27 NOTE — Progress Notes (Signed)
[name] CONSULT NOTE  Patient Care Team: Sharyne Peach, MD as PCP - General (Family Medicine) Clent Jacks, RN as Registered Nurse  CHIEF COMPLAINTS/PURPOSE OF CONSULTATION: mesenteric  #  Oncology History Overview Note  #Prostate cancer-s/p RP- Dr.Border; July 2020- non-detectable.  #June 2021-CT scan-3.5 cm mesenteric mass [incidental]  #    Malignant neoplasm of prostate ([facility])  12/31/2014 Initial Diagnosis   Malignant neoplasm of prostate ([place])      HISTORY OF PRESENTING ILLNESS:  Patrick Pena 69 y.o.  male prior history of early stage prostate cancer status post prostatectomy 2017; incidentally noted to have a abdominal mass on CT scan.  Patient states that he fell off a ladder for which was evaluated in the emergency room with CT scan of the abdomen pelvis.  And pelvis CT scan showed-pelvic fractures which are being managed conservatively.  However CT scan showed about a 3.5 cm mass in the root of the mesentery close to the IVC; and few mesenteric lymph nodes largest up to 17 mm.  Patient denies any unusual weight loss.  Denies any night sweats.  Denies any fevers.  Denies any lumps or bumps.  Apart from the pain from the fall, patient is asymptomatic.  Review of Systems  Constitutional: Negative for chills, diaphoresis, fever, malaise/fatigue and weight loss.  HENT: Negative for nosebleeds and sore throat.   Eyes: Negative for double vision.  Respiratory: Negative for cough, hemoptysis, sputum production, shortness of breath and wheezing.   Cardiovascular: Negative for chest pain, palpitations, orthopnea and leg swelling.  Gastrointestinal: Negative for abdominal pain, blood in stool, constipation, diarrhea, heartburn, melena, nausea and vomiting.  Genitourinary: Negative for dysuria, frequency and urgency.  Musculoskeletal: Positive for back pain and joint pain.  Skin: Negative.  Negative for itching and rash.  Neurological: Negative for  dizziness, tingling, focal weakness, weakness and headaches.  Endo/Heme/Allergies: Does not bruise/bleed easily.  Psychiatric/Behavioral: Negative for depression. The patient is not nervous/anxious and does not have insomnia.      MEDICAL HISTORY:  Past Medical History:  Diagnosis Date  . Arthritis   . Blood pressure elevated 05/12/2014  . BP (high blood pressure) 03/13/2013  . 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 GERD (gastroesophageal reflux disease)   . Hypercholesteremia   . Hypercholesterolemia 03/13/2013  . Hypertension   . Prostate cancer [facility])    prostate cancer     SURGICAL HISTORY: Past Surgical History:  Procedure Laterality Date  . LYMPHADENECTOMY Bilateral 02/04/2015   Procedure: BILATERAL PELVIC LYMPHADENECTOMY;  Surgeon: Raynelle Bring, MD;  Location: [facility];  Service: Urology;  Laterality: Bilateral;  . NO PAST SURGERIES    . ROBOT ASSISTED LAPAROSCOPIC RADICAL PROSTATECTOMY N/A 02/04/2015   Procedure: XI ROBOTIC ASSISTED LAPAROSCOPIC RADICAL PROSTATECTOMY LEVEL 2;  Surgeon: Raynelle Bring, MD;  Location: [facility];  Service: Urology;  Laterality: N/A;    SOCIAL HISTORY: Social History   Socioeconomic History  . Marital status: Married    Spouse name: Not on file  . Number of children: Not on file  . Years of education: Not on file  . Highest education level: Not on file  Occupational History  . Not on file  Tobacco Use  . Smoking status: Never Smoker  . Smokeless tobacco: Never Used  Substance and Sexual Activity  . Alcohol use: Yes    Comment: 2-4 ounces almost every day   . Drug use: No  .  Sexual activity: Not on file  Other Topics Concern  . Not on file  Social History Narrative   Lives in [place]; Dealer; never smoked; whiskey every day.    Social Determinants of Health   Financial Resource Strain:   . Difficulty of Paying Living Expenses:   Food Insecurity:   . Worried About  Charity fundraiser in the Last Year:   . Arboriculturist in the Last Year:   Transportation Needs:   . Film/video editor (Medical):   Marland Kitchen Lack of Transportation (Non-Medical):   Physical Activity:   . Days of Exercise per Week:   . Minutes of Exercise per Session:   Stress:   . Feeling of Stress :   Social Connections:   . Frequency of Communication with Friends and Family:   . Frequency of Social Gatherings with Friends and Family:   . Attends Religious Services:   . Active Member of Clubs or Organizations:   . Attends Archivist Meetings:   Marland Kitchen Marital Status:   Intimate Partner Violence:   . Fear of Current or Ex-Partner:   . Emotionally Abused:   Marland Kitchen Physically Abused:   . Sexually Abused:     FAMILY HISTORY: Family History  Problem Relation Age of Onset  . Diabetes Mellitus II Other   . Hyperlipidemia Other   . Stroke Other   . Hypertension Other   . Kidney cancer Sister        survived  . Prostate cancer Neg Hx   . Tuberculosis Neg Hx     ALLERGIES:  is allergic to atorvastatin.  MEDICATIONS:  Current Outpatient Medications  Medication Sig Dispense Refill  . aspirin 81 MG chewable tablet Chew 81 mg by mouth daily.    Marland Kitchen docusate sodium (COLACE) 100 MG capsule Take 1 capsule (100 mg total) by mouth 2 (two) times daily. 60 capsule 2  . famotidine (PEPCID) 40 MG tablet Take 1 tablet by mouth at bedtime.    Marland Kitchen HYDROcodone-acetaminophen (NORCO) 5-325 MG tablet Take 1-2 tablets by mouth every 6 (six) hours as needed. 30 tablet 0  . Liniments (DEEP BLUE RELIEF EX) Apply 1 application topically 2 (two) times daily as needed (muscle pain.).    Marland Kitchen lisinopril (PRINIVIL,ZESTRIL) 5 MG tablet TAKE 1 TABLET BY MOUTH DAILY 30 tablet 0  . metoprolol tartrate (LOPRESSOR) 25 MG tablet TAKE 1/2 TABLET BY MOUTH TWICE A DAY 30 tablet 0  . simvastatin (ZOCOR) 10 MG tablet Take 1 tablet by mouth at bedtime.    Marland Kitchen acetaminophen (TYLENOL) 500 MG tablet Take 1,000 mg by mouth every  6 (six) hours as needed for moderate pain. (Patient not taking: Reported on 06/26/2019)     No current facility-administered medications for this visit.      Marland Kitchen  PHYSICAL EXAMINATION: ECOG PERFORMANCE STATUS: 0 - Asymptomatic  Vitals:   06/27/19 0840  BP: 138/61  Pulse: (!) 58  Temp: (!) 97.1 F (36.2 C)   Filed Weights   06/27/19 0840  Weight: 182 lb (82.6 kg)    Physical Exam Constitutional:      Comments: Accompanied by his wife.  He is walking with a walker (given the fall)  HENT:     Head: Normocephalic and atraumatic.     Mouth/Throat:     Pharynx: No oropharyngeal exudate.  Eyes:     Pupils: Pupils are equal, round, and reactive to light.  Cardiovascular:     Rate and Rhythm: Normal rate and  regular rhythm.  Pulmonary:     Effort: Pulmonary effort is normal. No respiratory distress.     Breath sounds: Normal breath sounds. No wheezing.  Abdominal:     General: Bowel sounds are normal. There is no distension.     Palpations: Abdomen is soft. There is no mass.     Tenderness: There is no abdominal tenderness. There is no guarding or rebound.  Musculoskeletal:        General: No tenderness. Normal range of motion.     Cervical back: Normal range of motion and neck supple.  Skin:    General: Skin is warm.  Neurological:     Mental Status: He is alert and oriented to person, place, and time.  Psychiatric:        Mood and Affect: Affect normal.      LABORATORY DATA:  I have reviewed the data as listed Lab Results  Component Value Date   WBC 10.8 (H) 06/27/2019   HGB 14.6 06/27/2019   HCT 42.5 06/27/2019   MCV 92.2 06/27/2019   PLT 227 06/27/2019   Recent Labs    06/25/19 1629  NA 136  K 3.8  CL 100  CO2 25  GLUCOSE 109*  BUN 21  CREATININE 1.06  CALCIUM 9.4  GFRNONAA >60  GFRAA >60  PROT 7.3  ALBUMIN 4.4  AST 36  ALT 26  ALKPHOS 63  BILITOT 0.9    RADIOGRAPHIC STUDIES: I have personally reviewed the radiological images as listed and  agreed with the findings in the report. DG Wrist Complete Right  Result Date: 06/25/2019 CLINICAL DATA:  Fall with right-sided wrist pain fell off ladder EXAM: RIGHT WRIST - COMPLETE 3+ VIEW COMPARISON:  None. FINDINGS: No acute displaced fracture or malalignment. Mild degenerative changes at the radiocarpal joint. Mild degenerative change at the first [facility] joint. Vascular calcifications. IMPRESSION: No acute osseous abnormality. Electronically Signed   By: Donavan Foil M.D.   On: 06/25/2019 17:27   CT PELVIS WO CONTRAST  Result Date: 06/25/2019 CLINICAL DATA:  69 year old male with fall and pelvic trauma. Complaining of right hip pain. EXAM: CT PELVIS WITHOUT CONTRAST TECHNIQUE: Multidetector CT imaging of the pelvis was performed following the standard protocol without intravenous contrast. COMPARISON:  Right hip radiograph dated 06/25/2019. CT of the pelvis dated 01/05/2015. FINDINGS: Evaluation of this exam is limited in the absence of intravenous contrast. Urinary Tract: The urinary bladder is unremarkable. Linear high attenuating density within the pelvis and in the region of the ureterovesical junction, likely postsurgical. Bowel: Sigmoid diverticulosis without active inflammatory changes. No bowel dilatation within the pelvis. Vascular/Lymphatic: Mild aortoiliac atherosclerotic disease. The IVC is unremarkable. There is a partially visualized 3.8 x 3.2 cm soft tissue mass anterior to the IVC. Additional enlarged mesenteric lymph nodes seen within the upper pelvis which have increased in size since the prior CT. CT of the abdomen recommended for complete evaluation. Reproductive:  Prostatectomy. Other:  Small fat containing bilateral inguinal hernias. Musculoskeletal: Nondisplaced fractures of the right inferior pubic ramus. Probable nondisplaced fracture of the left pubic bone adjacent to the symphysis pubis. There is mild osteopenia. No femoral neck fracture. No dislocation. Nondisplaced longitudinal  fracture of the inferior aspect of the right sacral al a. IMPRESSION: 1. Nondisplaced fractures of the right inferior pubic ramus and left pubic bone adjacent to the symphysis pubis. No dislocation. 2. Nondisplaced longitudinal fracture of the inferior aspect of the right sacral ala. 3. Partially visualized soft tissue mass anterior to the  IVC with additional enlarged mesenteric lymph nodes within the upper pelvis which have increased in size since the prior CT. CT of the abdomen recommended for complete evaluation. 4. Aortic Atherosclerosis (ICD10-I70.0). Electronically Signed   By: Anner Crete M.D.   On: 06/25/2019 18:33   CT ABDOMEN PELVIS W CONTRAST  Result Date: 06/25/2019 CLINICAL DATA:  Status post fall from 8 foot ladder. EXAM: CT ABDOMEN AND PELVIS WITH CONTRAST TECHNIQUE: Multidetector CT imaging of the abdomen and pelvis was performed using the standard protocol following bolus administration of intravenous contrast. CONTRAST:  176mL OMNIPAQUE IOHEXOL 300 MG/ML  SOLN COMPARISON:  None. FINDINGS: Lower chest: No acute abnormality. Hepatobiliary: No focal liver abnormality is seen. No gallstones, gallbladder wall thickening, or biliary dilatation. Pancreas: Unremarkable. No pancreatic ductal dilatation or surrounding inflammatory changes. Spleen: Normal in size without focal abnormality. Adrenals/Urinary Tract: Adrenal glands are unremarkable. Kidneys are normal, without renal calculi, focal lesion, or hydronephrosis. Bladder is unremarkable. Stomach/Bowel: Stomach is within normal limits. Appendix appears normal. No evidence of bowel wall thickening, distention, or inflammatory changes. Diverticulosis without evidence of diverticulitis. 3.5 x 3.1 cm soft tissue mass right anterolateral to the SVC within the root of the mesentery. Vascular/Lymphatic: Normal caliber abdominal aorta with mild atherosclerosis. Numerous mesenteric lymph nodes with the largest measuring 13 mm. Reproductive: Prostate is  unremarkable. Other: No ascites.  Bilateral fat containing inguinal hernias. Musculoskeletal: Acute nondisplaced mildly comminuted fracture of the right inferior pubic ramus. Subtle nondisplaced fracture of the superior aspect of the left pubic body. Acute nondisplaced fracture of the right sacral ala. No aggressive osseous lesion. Degenerative disease with disc height loss of the lower lumbar spine. Bilateral facet arthropathy at L4-5 and L5-S1. IMPRESSION: 1. Acute nondisplaced mildly comminuted fracture of the right inferior pubic ramus. 2. Subtle nondisplaced fracture of the superior aspect of the left pubic body. 3. A 3.5 x 3.1 cm soft tissue mass right anterolateral to the SVC within the root of the mesentery. Mild mesenteric lymphadenopathy. Differential considerations include malignancy such as metastatic disease or lymphoma. Recommend oncology consultation. 4. Aortic Atherosclerosis (ICD10-I70.0). Electronically Signed   By: Kathreen Devoid   On: 06/25/2019 19:41   DG Femur Portable Min 2 Views Right  Result Date: 06/25/2019 CLINICAL DATA:  69 year old male with fall and trauma to the right hip. EXAM: RIGHT FEMUR PORTABLE 2 VIEW COMPARISON:  Right hip radiograph dated 01/19/2015. FINDINGS: There is no acute fracture or dislocation. Mild arthritic changes of the right hip and right knee. The soft tissues are unremarkable. IMPRESSION: Negative. Electronically Signed   By: Anner Crete M.D.   On: 06/25/2019 17:15    ASSESSMENT & PLAN:   Mesenteric mass #Incidentally noted mesenteric mass 3.5 cm-close to IVC.  This is concerning for malignancy-differential diagnosis includes-lymphoma; metastatic disease-carcinoid versus prostate [less likely] versus other-malignancies.  Benign causes are also possible.  Recommend a PET scan.  Will discuss with the tumor conference.  #Discussed the role of further imaging; biopsy for further characterization.  #Leukocytosis 15,000 neutrophilia/positive for  immature granulocytes-check CBC  #Prostate cancer-s/p RP [2017].  Check PSA.  #Pelvic fracture s/p fall-conservative management/as per ortho  Thank you Dr.Williams  for allowing me to participate in the care of your pleasant patient. Please do not hesitate to contact me with questions or concerns in the interim.   # DISPOSITION: call wife.  # PET ASAP #Labs today-PSA; L[facility] CBC.  chromogranin A; #Follow-up to be decided-Dr. B  # I reviewed the blood work- with the patient  in detail; also reviewed the imaging independently [as summarized above]; and with the patient in detail.     All questions were answered. The patient knows to call the clinic with any problems, questions or concerns.    Cammie Sickle, MD 06/27/2019 12:05 PM

## 2019-06-28 LAB — CEA: CEA: 1.9 ng/mL (ref 0.0–4.7)

## 2019-06-29 LAB — CANCER ANTIGEN 19-9: CA 19-9: 6 U/mL (ref 0–35)

## 2019-06-30 LAB — CHROMOGRANIN A: Chromogranin A (ng/mL): 113.6 ng/mL — ABNORMAL HIGH (ref 0.0–101.8)

## 2019-07-03 ENCOUNTER — Encounter
Admission: RE | Admit: 2019-07-03 | Discharge: 2019-07-03 | Disposition: A | Discharge status: Home / Self Care | Payer: Medicare PPO | Source: Ambulatory Visit | Attending: Internal Medicine | Admitting: Internal Medicine

## 2019-07-03 ENCOUNTER — Other Ambulatory Visit: Payer: Self-pay

## 2019-07-03 DIAGNOSIS — K6389 Other specified diseases of intestine: Secondary | ICD-10-CM

## 2019-07-03 DIAGNOSIS — R1907 Generalized intra-abdominal and pelvic swelling, mass and lump: Secondary | ICD-10-CM | POA: Insufficient documentation

## 2019-07-03 LAB — GLUCOSE, CAPILLARY: Glucose-Capillary: 91 mg/dL (ref 70–99)

## 2019-07-03 MED ORDER — FLUDEOXYGLUCOSE F - 18 (FDG) INJECTION
9.4000 | Freq: Once | INTRAVENOUS | Status: AC | PRN
  Administered 2019-07-03: 10.23 via INTRAVENOUS

## 2019-07-08 ENCOUNTER — Telehealth: Payer: Self-pay | Admitting: Internal Medicine

## 2019-07-08 NOTE — Telephone Encounter (Signed)
On 07/06-spoke to patient/wife regarding PET scan suggestive of low-grade lymphoma; thyroid nodules.  Would recommend biopsy; likely need surgical/laparoscopic.  Patient/wife do not have any preference of surgeon.  Will discuss with Dr. Tollie Pizza.  We will also review at the tumor conference.

## 2019-07-10 ENCOUNTER — Telehealth: Payer: Self-pay | Admitting: Internal Medicine

## 2019-07-10 NOTE — Telephone Encounter (Signed)
On 7/08- spoke to pt's wife- will plan to review imaging at the conference; and then plan Biopsy. Wife in agreement. GB

## 2019-07-10 NOTE — Telephone Encounter (Signed)
On 7/7- LVM for wife [name] [pt's wife] to discuss the plan- will discuss at tumor conference 7/15; and then plan Biopsy.

## 2019-07-17 ENCOUNTER — Telehealth: Payer: Self-pay | Admitting: Internal Medicine

## 2019-07-17 ENCOUNTER — Other Ambulatory Visit (HOSPITAL_BASED_OUTPATIENT_CLINIC_OR_DEPARTMENT_OTHER): Payer: Medicare PPO

## 2019-07-18 ENCOUNTER — Other Ambulatory Visit: Payer: Self-pay | Admitting: Internal Medicine

## 2019-07-18 DIAGNOSIS — K6389 Other specified diseases of intestine: Secondary | ICD-10-CM

## 2019-07-18 NOTE — Progress Notes (Addendum)
Tumor Board Documentation  Patrick Pena was presented by Dr Rogue Bussing at our Tumor Board on 07/17/2019, which included representatives from medical oncology, radiation oncology, internal medicine, navigation, pathology, radiology, surgical, genetics, research, palliative care, pulmonology.  Patrick Pena currently presents as a new patient, for discussion with history of the following treatments: active survellience.  Additionally, we reviewed previous medical and familial history, history of present illness, and recent lab results along with all available histopathologic and imaging studies. The tumor board considered available treatment options and made the following recommendations: Biopsy (CT Guided)    The following procedures/referrals were also placed: No orders of the defined types were placed in this encounter.   Clinical Trial Status: not discussed   Staging used: Not Applicable  National site-specific guidelines   were discussed with respect to the case.  Tumor board is a meeting of clinicians from various specialty areas who evaluate and discuss patients for whom a multidisciplinary approach is being considered. Final determinations in the plan of care are those of the provider(s). The responsibility for follow up of recommendations given during tumor board is that of the provider.   Today's extended care, comprehensive team conference, Patrick Pena was not present for the discussion and was not examined.   Multidisciplinary Tumor Board is a multidisciplinary case peer review process.  Decisions discussed in the Multidisciplinary Tumor Board reflect the opinions of the specialists present at the conference without having examined the patient.  Ultimately, treatment and diagnostic decisions rest with the primary provider(s) and the patient. On 7/15- LVM for pt- to call us back to discuss Tumor conference recommendation for CT guided LN bx.

## 2019-07-18 NOTE — Progress Notes (Signed)
Spoke to wife re: Biopsy; in agreement  Schedule Biopsy thru IR; ordered. STOP asprin 7 days prior to Biopsy.   Follow up- 1 week post Bx- MD; No labs-   Thanks, GB

## 2019-07-18 NOTE — Progress Notes (Signed)
Ct guided biopsy worksheet faxed to specialty scheduling.

## 2019-07-22 ENCOUNTER — Encounter: Payer: Self-pay | Admitting: *Deleted

## 2019-07-22 NOTE — Progress Notes (Signed)
Apts made. Left vm for patient to return my phone call. Also sent detailed mychart msg to patient regarding the appointments.

## 2019-07-28 ENCOUNTER — Other Ambulatory Visit: Payer: Self-pay | Admitting: Radiology

## 2019-07-28 ENCOUNTER — Other Ambulatory Visit: Payer: Self-pay | Admitting: Student

## 2019-07-29 ENCOUNTER — Other Ambulatory Visit: Payer: Self-pay

## 2019-07-29 ENCOUNTER — Ambulatory Visit
Admission: RE | Admit: 2019-07-29 | Discharge: 2019-07-29 | Disposition: A | Discharge status: Home / Self Care | Payer: Medicare PPO | Source: Ambulatory Visit | Attending: Internal Medicine | Admitting: Internal Medicine

## 2019-07-29 DIAGNOSIS — K219 Gastro-esophageal reflux disease without esophagitis: Secondary | ICD-10-CM | POA: Diagnosis not present

## 2019-07-29 DIAGNOSIS — M1990 Unspecified osteoarthritis, unspecified site: Secondary | ICD-10-CM | POA: Insufficient documentation

## 2019-07-29 DIAGNOSIS — Z8546 Personal history of malignant neoplasm of prostate: Secondary | ICD-10-CM | POA: Insufficient documentation

## 2019-07-29 DIAGNOSIS — K6389 Other specified diseases of intestine: Secondary | ICD-10-CM | POA: Diagnosis present

## 2019-07-29 DIAGNOSIS — I10 Essential (primary) hypertension: Secondary | ICD-10-CM | POA: Diagnosis not present

## 2019-07-29 DIAGNOSIS — C8513 Unspecified B-cell lymphoma, intra-abdominal lymph nodes: Secondary | ICD-10-CM | POA: Diagnosis not present

## 2019-07-29 DIAGNOSIS — E785 Hyperlipidemia, unspecified: Secondary | ICD-10-CM | POA: Diagnosis not present

## 2019-07-29 LAB — CBC
HCT: 47.4 % (ref 39.0–52.0)
Hemoglobin: 16.3 g/dL (ref 13.0–17.0)
MCH: 31.5 pg (ref 26.0–34.0)
MCHC: 34.4 g/dL (ref 30.0–36.0)
MCV: 91.5 fL (ref 80.0–100.0)
Platelets: 241 10*3/uL (ref 150–400)
RBC: 5.18 MIL/uL (ref 4.22–5.81)
RDW: 13.2 % (ref 11.5–15.5)
WBC: 6.6 10*3/uL (ref 4.0–10.5)
nRBC: 0 % (ref 0.0–0.2)

## 2019-07-29 LAB — PROTIME-INR
INR: 0.9 (ref 0.8–1.2)
Prothrombin Time: 12.2 seconds (ref 11.4–15.2)

## 2019-07-29 MED ORDER — SODIUM CHLORIDE 0.9 % IV SOLN: INTRAVENOUS | Status: DC

## 2019-07-29 MED ORDER — MIDAZOLAM HCL 2 MG/2ML IJ SOLN
INTRAMUSCULAR | Status: AC | PRN
  Administered 2019-07-29: 1 mg via INTRAVENOUS

## 2019-07-29 MED ORDER — MIDAZOLAM HCL 2 MG/2ML IJ SOLN
INTRAMUSCULAR | Status: AC
  Filled 2019-07-29: qty 2

## 2019-07-29 MED ORDER — FENTANYL CITRATE (PF) 100 MCG/2ML IJ SOLN
INTRAMUSCULAR | Status: AC
  Filled 2019-07-29: qty 2

## 2019-07-29 MED ORDER — MIDAZOLAM HCL 5 MG/5ML IJ SOLN
INTRAMUSCULAR | Status: AC | PRN
  Administered 2019-07-29: 1 mg via INTRAVENOUS

## 2019-07-29 NOTE — Discharge Instructions (Signed)

## 2019-07-29 NOTE — Procedures (Signed)
Pre procedural Dx: Mesenteric mass  Post procedural Dx: Same  Technically successful CT guided biopsy of hypermetabolic mesenteric mass.   EBL: None.  Complications: None immediate.   Jay Betty Daidone, MD Pager #: [phone]   

## 2019-07-29 NOTE — H&P (Signed)
Chief Complaint: Patient was seen in consultation today for right mesenteric mass biopsy.  Referring Physician(s): Cammie Sickle  Supervising Physician: Sandi Mariscal  Patient Status: [facility] - Out-pt  History of Present Illness: Patrick Pena is a 69 y.o. male with a past medical history significant for arthritis, GERD, HTN, HLD and prostate cancer s/p prostatectomy (2017) who presents today for a right mesenteric mass biopsy. Patrick Pena fell off a ladder on 06/25/19 and presented to the ED for evaluation, during workup he underwent a CT abd/pelvis w/contrast which showed acute nondisplaced mildly comminuted fracture of the right inferior pubic ramus, subtle nondisplaced fracture of the superior aspect of the left pubic body as well as a 3.5 x 3.1 cm soft tissue mass right anterolateral to the SVC within the root of the mesentery and mild mesenteric lymphadenopathy. His pelvic fractures were treated conservatively and he was referred to oncology for further evaluation of the mass. 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 IR has been asked to perform a biopsy of this mass to further direct care.  Patrick Pena denies any complaints today besides pain in his hip which is from his recent fall. He has been doing well at home otherwise. He understands the biopsy today and is agreeable to proceed as planned.  Past Medical History:  Diagnosis Date  . Arthritis   . Blood pressure elevated 05/12/2014  . BP (high blood pressure) 03/13/2013  . 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 GERD (gastroesophageal reflux disease)   . Hypercholesteremia   . Hypercholesterolemia 03/13/2013  . Hypertension   . Prostate cancer [facility])    prostate cancer     Past Surgical History:  Procedure Laterality Date  . LYMPHADENECTOMY  Bilateral 02/04/2015   Procedure: BILATERAL PELVIC LYMPHADENECTOMY;  Surgeon: Raynelle Bring, MD;  Location: [facility];  Service: Urology;  Laterality: Bilateral;  . NO PAST SURGERIES    . ROBOT ASSISTED LAPAROSCOPIC RADICAL PROSTATECTOMY N/A 02/04/2015   Procedure: XI ROBOTIC ASSISTED LAPAROSCOPIC RADICAL PROSTATECTOMY LEVEL 2;  Surgeon: Raynelle Bring, MD;  Location: [facility];  Service: Urology;  Laterality: N/A;    Allergies: Atorvastatin  Medications: Prior to Admission medications   Medication Sig Start Date End Date Taking? Authorizing Provider  acetaminophen (TYLENOL) 500 MG tablet Take 1,000 mg by mouth every 6 (six) hours as needed for moderate pain.    Yes [provider]  aspirin 81 MG chewable tablet Chew 81 mg by mouth daily.   Yes [provider]  docusate sodium (COLACE) 100 MG capsule Take 1 capsule (100 mg total) by mouth 2 (two) times daily. 06/25/19 06/24/20 Yes Earleen Newport, MD  famotidine (PEPCID) 40 MG tablet Take 1 tablet by mouth at bedtime. 04/30/19 04/29/20 Yes [provider]  HYDROcodone-acetaminophen (NORCO) 5-325 MG tablet Take 1-2 tablets by mouth every 6 (six) hours as needed. 02/04/15  Yes Dancy, Amanda, PA-C  Liniments (DEEP BLUE RELIEF EX) Apply 1 application topically 2 (two) times daily as needed (muscle pain.).   Yes [provider]  lisinopril (PRINIVIL,ZESTRIL) 5 MG tablet TAKE 1 TABLET BY MOUTH DAILY 11/04/14  Yes Juline Patch, MD  metoprolol tartrate (LOPRESSOR) 25 MG tablet TAKE 1/2 TABLET BY MOUTH TWICE A DAY 11/04/14  Yes Juline Patch, MD  Multiple Vitamin (MULTIVITAMIN) tablet Take 1 tablet by mouth daily.  Yes [provider]  simvastatin (ZOCOR) 10 MG tablet Take 1 tablet by mouth at bedtime. 04/30/19 04/29/20 Yes [provider]     Family History  Problem Relation Age of Onset  . Diabetes Mellitus II Other   . Hyperlipidemia Other   . Stroke Other   . Hypertension Other   . Kidney cancer  Sister        survived  . Prostate cancer Neg Hx   . Tuberculosis Neg Hx     Social History   Socioeconomic History  . Marital status: Married    Spouse name: Not on file  . Number of children: Not on file  . Years of education: Not on file  . Highest education level: Not on file  Occupational History  . Not on file  Tobacco Use  . Smoking status: Never Smoker  . Smokeless tobacco: Never Used  Substance and Sexual Activity  . Alcohol use: Yes    Comment: 2-4 ounces almost every day   . Drug use: No  . Sexual activity: Not on file  Other Topics Concern  . Not on file  Social History Narrative   Lives in [place]; Dealer; never smoked; whiskey every day.    Social Determinants of Health   Financial Resource Strain:   . Difficulty of Paying Living Expenses:   Food Insecurity:   . Worried About Charity fundraiser in the Last Year:   . Arboriculturist in the Last Year:   Transportation Needs:   . Film/video editor (Medical):   Marland Kitchen Lack of Transportation (Non-Medical):   Physical Activity:   . Days of Exercise per Week:   . Minutes of Exercise per Session:   Stress:   . Feeling of Stress :   Social Connections:   . Frequency of Communication with Friends and Family:   . Frequency of Social Gatherings with Friends and Family:   . Attends Religious Services:   . Active Member of Clubs or Organizations:   . Attends Archivist Meetings:   Marland Kitchen Marital Status:      Review of Systems: A 12 point ROS discussed and pertinent positives are indicated in the HPI above.  All other systems are negative.  Review of Systems  Constitutional: Negative for appetite change, chills and fever.  Respiratory: Negative for cough and shortness of breath.   Cardiovascular: Negative for chest pain.  Gastrointestinal: Negative for abdominal pain, diarrhea, nausea and vomiting.  Musculoskeletal: Negative for back pain.       (+) hip pain due to recent pubic fractures    Neurological: Negative for dizziness and headaches.    Vital Signs: BP (!) 159/73   Pulse 58   Temp 98.2 F (36.8 C) (Oral)   Resp 17   Ht 5\' 9"  (1.753 m)   Wt 185 lb (83.9 kg)   SpO2 98%   BMI 27.32 kg/m   Physical Exam Vitals reviewed.  Constitutional:      General: He is not in acute distress. HENT:     Head: Normocephalic.     Mouth/Throat:     Mouth: Mucous membranes are moist.     Pharynx: Oropharynx is clear. No oropharyngeal exudate or posterior oropharyngeal erythema.  Cardiovascular:     Rate and Rhythm: Normal rate and regular rhythm.  Pulmonary:     Effort: Pulmonary effort is normal.     Breath sounds: Normal breath sounds.  Abdominal:     General: There is  no distension.     Palpations: Abdomen is soft.     Tenderness: There is no abdominal tenderness.  Skin:    General: Skin is warm and dry.  Neurological:     Mental Status: He is alert and oriented to person, place, and time.  Psychiatric:        Mood and Affect: Mood normal.        Behavior: Behavior normal.        Thought Content: Thought content normal.        Judgment: Judgment normal.      MD Evaluation Airway: WNL Heart: WNL Abdomen: WNL Chest/ Lungs: WNL ASA  Classification: 3 Mallampati/Airway Score: Two   Imaging: NM PET Image Initial (PI) Skull Base To Thigh  Result Date: 07/03/2019 CLINICAL DATA:  Initial treatment strategy for mesenteric mass. History prostate cancer. EXAM: NUCLEAR MEDICINE PET SKULL BASE TO THIGH TECHNIQUE: 10.2 mCi F-18 FDG was injected intravenously. Full-ring PET imaging was performed from the skull base to thigh after the radiotracer. CT data was obtained and used for attenuation correction and anatomic localization. Fasting blood glucose: 91 mg/dl COMPARISON:  06/25/2019 FINDINGS: Mediastinal blood pool activity: SUV max 1.7 Liver activity: SUV max 3.3 NECK: Homogeneously hyperdense 3.0 by 2.6 cm left inferior thyroid nodule extending down into the upper  mediastinum, internal density on noncontrast images 87 Hounsfield units, the lesion appears to be photopenic on PET-CT and accordingly thought to represent a cystic lesion. Adjacent calcified hypodense left thyroid nodule 2.4 by 1.6 cm, not appreciably hypermetabolic. These lesions are of very low likelihood represent malignancy given the negative PET-CT findings, and many experts consider a negative PET-CT to be a reasonably specific indicator of benign process. However, current guidelines for thyroid assessment do not take into account negative PET-CT evidence, and accordingly these lesions otherwise qualify for thyroid ultrasound. Recommend thyroid US (ref: J Am Coll Radiol. 2015 Feb;12(2): 143-50). Incidental CT findings: Bilateral common carotid atherosclerotic calcification. CHEST: Left upper paratracheal lymph node 1.0 cm in short axis on image 74/3, maximum SUV 2.5, Deauville 3. Other smaller Deauville 3 prevascular lymph nodes are present. Incidental CT findings: Coronary, aortic arch, and branch vessel atherosclerotic vascular disease. ABDOMEN/PELVIS: Dominant right eccentric mesenteric lymph node/mass measuring 3.3 by 3.8 cm on image 183/3, maximum SUV 8.0, Deauville 5. Smaller but hypermetabolic and prominent in number central mesenteric lymph nodes include a 1.3 cm in short axis mesenteric node on image 166/3 with maximum SUV of 4.2, Deauville 4. There is accentuated activity in small right gastric and celiac chain lymph nodes, and a portacaval node measuring 0.9 cm in short axis on image 155/3 has a maximum SUV of 3.7, Deauville 4. A right lower quadrant 1.9 by 2.7 cm soft tissue nodule in the small bowel mesentery on image 203/3 has maximum SUV of 5.0, Deauville 4. Activity in bowel is likely physiologic. No splenomegaly or focal splenic lesion identified. Incidental CT findings: Aortoiliac atherosclerotic vascular disease. Small left hydrocele. SKELETON: Low-grade hypermetabolic activity associated  with healing fractures of the right sacral ala, right superior pubic ramus/anterior acetabular wall, right inferior pubic ramus, and left pubic bone. Appearance favors posttraumatic etiology without underlying pathologic bone lesion. Incidental CT findings: Questionable nondisplaced fracture of the right lateral fifth rib on image 105/3, without appreciable accentuated metabolic activity. IMPRESSION: 1. Mesenteric adenopathy in the abdomen and pelvis is primarily Deauville 4 although there is a dominant right abdominal mesenteric mass measuring 3.8 by 3.3 cm which is Deauville 5. The pattern and  distribution favors lymphoma. 2. Left thyroid lobe lesions are probably benign based on negative PET-CT characteristics, but current guidelines still call for thyroid ultrasound as they do not take into account negative PET-CT evidence. 3. A borderline enlarged left upper paratracheal lymph node has Deauville 3 activity. 4. Other imaging findings of potential clinical significance: Aortic Atherosclerosis (ICD10-I70.0). Coronary atherosclerosis. Small left scrotal hydrocele. Healing pelvic fractures including the right sacral ala, right pubic rami, and left pubic bone. Electronically Signed   By: Van Clines M.D.   On: 07/03/2019 17:09    Labs:  CBC: Recent Labs    06/25/19 1629 06/27/19 1055  WBC 15.7* 10.8*  HGB 16.1 14.6  HCT 47.2 42.5  PLT 294 227    COAGS: No results for input(s): INR, APTT in the last 8760 hours.  BMP: Recent Labs    06/25/19 1629  NA 136  K 3.8  CL 100  CO2 25  GLUCOSE 109*  BUN 21  CALCIUM 9.4  CREATININE 1.06  GFRNONAA >60  GFRAA >60    LIVER FUNCTION TESTS: Recent Labs    06/25/19 1629  BILITOT 0.9  AST 36  ALT 26  ALKPHOS 63  PROT 7.3  ALBUMIN 4.4    TUMOR MARKERS: No results for input(s): AFPTM, CEA, CA199, CHROMGRNA in the last 8760 hours.  Assessment and Plan:  69 y/o M with history of prostate cancer s/p prostatectomy (2017) with recent  incident CT finding of right mesenteric mass concerning for lymphoma vs metastatic disease. IR has been asked to perform a percutaneous biopsy to further direct care.  Patient has been NPO since 8 pm last night, no current anticoagulation/antiplatelet medications. Afebrile, WBC 6.6, hgb 16.3, plt 241, INR pending at time of this note writing.  Risks and benefits of right mesenteric mass biopsy was discussed with the patient and/or patient's family including, but not limited to bleeding, infection, damage to adjacent structures or low yield requiring additional tests.  All of the questions were answered and there is agreement to proceed.  Consent signed and in chart.  Thank you for this interesting consult.  I greatly enjoyed meeting Patrick Pena and look forward to participating in their care.  A copy of this report was sent to the requesting provider on this date.  Electronically Signed: Joaquim Nam, PA-C 07/29/2019, 9:58 AM   I spent a total of  30 Minutes in face to face in clinical consultation, greater than 50% of which was counseling/coordinating care for right mesenteric mass biopsy.

## 2019-07-31 ENCOUNTER — Encounter: Payer: Self-pay | Admitting: Internal Medicine

## 2019-07-31 LAB — SURGICAL PATHOLOGY

## 2019-08-04 ENCOUNTER — Encounter: Payer: Self-pay | Admitting: Internal Medicine

## 2019-08-05 ENCOUNTER — Inpatient Hospital Stay: Payer: Medicare PPO | Attending: Internal Medicine | Admitting: Internal Medicine

## 2019-08-05 ENCOUNTER — Other Ambulatory Visit: Payer: Self-pay

## 2019-08-05 DIAGNOSIS — I10 Essential (primary) hypertension: Secondary | ICD-10-CM | POA: Diagnosis not present

## 2019-08-05 DIAGNOSIS — E7800 Pure hypercholesterolemia, unspecified: Secondary | ICD-10-CM | POA: Diagnosis not present

## 2019-08-05 DIAGNOSIS — Z7982 Long term (current) use of aspirin: Secondary | ICD-10-CM | POA: Diagnosis not present

## 2019-08-05 DIAGNOSIS — Z9079 Acquired absence of other genital organ(s): Secondary | ICD-10-CM | POA: Diagnosis not present

## 2019-08-05 DIAGNOSIS — Z79899 Other long term (current) drug therapy: Secondary | ICD-10-CM | POA: Insufficient documentation

## 2019-08-05 DIAGNOSIS — Z8546 Personal history of malignant neoplasm of prostate: Secondary | ICD-10-CM | POA: Insufficient documentation

## 2019-08-05 DIAGNOSIS — Z8051 Family history of malignant neoplasm of kidney: Secondary | ICD-10-CM | POA: Insufficient documentation

## 2019-08-05 DIAGNOSIS — K219 Gastro-esophageal reflux disease without esophagitis: Secondary | ICD-10-CM | POA: Diagnosis not present

## 2019-08-05 DIAGNOSIS — C8513 Unspecified B-cell lymphoma, intra-abdominal lymph nodes: Secondary | ICD-10-CM | POA: Insufficient documentation

## 2019-08-05 DIAGNOSIS — C8583 Other specified types of non-Hodgkin lymphoma, intra-abdominal lymph nodes: Secondary | ICD-10-CM | POA: Diagnosis not present

## 2019-08-05 DIAGNOSIS — Z833 Family history of diabetes mellitus: Secondary | ICD-10-CM | POA: Diagnosis not present

## 2019-08-05 DIAGNOSIS — C8593 Non-Hodgkin lymphoma, unspecified, intra-abdominal lymph nodes: Secondary | ICD-10-CM | POA: Insufficient documentation

## 2019-08-05 NOTE — Progress Notes (Signed)
**Note Patrick-Identified via Obfuscation** Patrick Pena CONSULT NOTE  Patient Care Team: Sharyne Peach, MD as PCP - General (Family Medicine) Clent Jacks, RN as Registered Nurse  CHIEF COMPLAINTS/PURPOSE OF CONSULTATION: mesenteric  #  Oncology History Overview Note  #Prostate cancer-s/p RP- Dr.Border; July 2020- non-detectable.  #June 2021-CT scan-3.5 cm mesenteric mass [incidental]  # July 30th 2021- CT-Biopsy- # DIAGNOSIS:  A. 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   Comment:  Sections of the biopsy material demonstrate fragmented and limited  lymphoid tissue.  Material was submitted to [facility] for Molecular Biology and  Pathology for flow cytometric analysis. Per report a CD10 positive  monoclonal kappa B cell population was detected. The specimen was  limited by low cellularity and low viability (see scanned report in  [facility]).  Based on the flow cytometric results follicular lymphoma is a diagnostic  consideration. An excisional biopsy is required for definitive  classification and grading.    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  male prior history of early stage prostate cancer status post prostatectomy 2017; incidentally noted to have abdominal mass is here to review the results of the pathology.  Patient denies any new lumps or bumps.  Appetite is good.  No weight loss.  No nausea or vomiting.  Denies any weight loss.  No fevers.  Patient seems to be healing well from his pelvic fractures post fall.  He had an appointment with orthopedic this morning.  He is walking dependently.  Review of Systems  Constitutional: Negative for  chills, diaphoresis, fever, malaise/fatigue and weight loss.  HENT: Negative for nosebleeds and sore throat.   Eyes: Negative for double vision.  Respiratory: Negative for cough, hemoptysis, sputum production, shortness of breath and wheezing.   Cardiovascular: Negative for chest pain, palpitations, orthopnea and leg swelling.  Gastrointestinal: Negative for abdominal pain, blood in stool, constipation, diarrhea, heartburn, melena, nausea and vomiting.  Genitourinary: Negative for dysuria, frequency and urgency.  Musculoskeletal: Positive for back pain and joint pain.  Skin: Negative.  Negative for itching and rash.  Neurological: Negative for dizziness, tingling, focal weakness, weakness and headaches.  Endo/Heme/Allergies: Does not bruise/bleed easily.  Psychiatric/Behavioral: Negative for depression. The patient is not nervous/anxious and does not have insomnia.      MEDICAL HISTORY:  Past Medical History:  Diagnosis Date  . Arthritis   . Blood pressure elevated 05/12/2014  . BP (high blood pressure) 03/13/2013  . 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 GERD (gastroesophageal reflux disease)   . Hypercholesteremia   . Hypercholesterolemia 03/13/2013  . Hypertension   . Prostate cancer [facility])    prostate cancer     SURGICAL HISTORY: Past Surgical History:  Procedure Laterality Date  . LYMPHADENECTOMY Bilateral 02/04/2015   Procedure: BILATERAL PELVIC LYMPHADENECTOMY;  Surgeon: Raynelle Bring, MD;  Location: [facility];  Service: Urology;  Laterality: Bilateral;  . NO PAST SURGERIES    . ROBOT ASSISTED LAPAROSCOPIC RADICAL PROSTATECTOMY N/A 02/04/2015   Procedure: XI ROBOTIC ASSISTED LAPAROSCOPIC RADICAL PROSTATECTOMY LEVEL 2;  Surgeon: Raynelle Bring, MD;  Location: [facility];  Service: Urology;  Laterality:  N/A;    SOCIAL HISTORY: Social History   Socioeconomic History  . Marital status: Married    Spouse name: Not on  file  . Number of children: Not on file  . Years of education: Not on file  . Highest education level: Not on file  Occupational History  . Not on file  Tobacco Use  . Smoking status: Never Smoker  . Smokeless tobacco: Never Used  Substance and Sexual Activity  . Alcohol use: Yes    Comment: 2-4 ounces almost every day   . Drug use: No  . Sexual activity: Not on file  Other Topics Concern  . Not on file  Social History Narrative   Lives in [place]; Dealer; never smoked; whiskey every day.    Social Determinants of Health   Financial Resource Strain:   . Difficulty of Paying Living Expenses:   Food Insecurity:   . Worried About Charity fundraiser in the Last Year:   . Arboriculturist in the Last Year:   Transportation Needs:   . Film/video editor (Medical):   Marland Kitchen Lack of Transportation (Non-Medical):   Physical Activity:   . Days of Exercise per Week:   . Minutes of Exercise per Session:   Stress:   . Feeling of Stress :   Social Connections:   . Frequency of Communication with Friends and Family:   . Frequency of Social Gatherings with Friends and Family:   . Attends Religious Services:   . Active Member of Clubs or Organizations:   . Attends Archivist Meetings:   Marland Kitchen Marital Status:   Intimate Partner Violence:   . Fear of Current or Ex-Partner:   . Emotionally Abused:   Marland Kitchen Physically Abused:   . Sexually Abused:     FAMILY HISTORY: Family History  Problem Relation Age of Onset  . Diabetes Mellitus II Other   . Hyperlipidemia Other   . Stroke Other   . Hypertension Other   . Kidney cancer Sister        survived  . Prostate cancer Neg Hx   . Tuberculosis Neg Hx     ALLERGIES:  is allergic to atorvastatin.  MEDICATIONS:  Current Outpatient Medications  Medication Sig Dispense Refill  . acetaminophen (TYLENOL) 500 MG tablet Take 1,000 mg by mouth every 6 (six) hours as needed for moderate pain.     Marland Kitchen aspirin 81 MG chewable tablet Chew 81  mg by mouth daily.    Marland Kitchen docusate sodium (COLACE) 100 MG capsule Take 1 capsule (100 mg total) by mouth 2 (two) times daily. 60 capsule 2  . famotidine (PEPCID) 40 MG tablet Take 1 tablet by mouth at bedtime.    Marland Kitchen HYDROcodone-acetaminophen (NORCO) 5-325 MG tablet Take 1-2 tablets by mouth every 6 (six) hours as needed. 30 tablet 0  . Liniments (DEEP BLUE RELIEF EX) Apply 1 application topically 2 (two) times daily as needed (muscle pain.).    Marland Kitchen lisinopril (PRINIVIL,ZESTRIL) 5 MG tablet TAKE 1 TABLET BY MOUTH DAILY 30 tablet 0  . metoprolol tartrate (LOPRESSOR) 25 MG tablet TAKE 1/2 TABLET BY MOUTH TWICE A DAY 30 tablet 0  . Multiple Vitamin (MULTIVITAMIN) tablet Take 1 tablet by mouth daily.    . simvastatin (ZOCOR) 10 MG tablet Take 1 tablet by mouth at bedtime.     No current facility-administered medications for this visit.      Marland Kitchen  PHYSICAL EXAMINATION: ECOG PERFORMANCE STATUS: 0 - Asymptomatic  Vitals:  08/05/19 1500  BP: (!) 146/74  Pulse: 68  Resp: 18  Temp: (!) 96.9 F (36.1 C)   Filed Weights   08/05/19 1505  Weight: 183 lb (83 kg)    Physical Exam Constitutional:      Comments: Accompanied by his wife.  He is walking independently.  HENT:     Head: Normocephalic and atraumatic.     Mouth/Throat:     Pharynx: No oropharyngeal exudate.  Eyes:     Pupils: Pupils are equal, round, and reactive to light.  Cardiovascular:     Rate and Rhythm: Normal rate and regular rhythm.  Pulmonary:     Effort: Pulmonary effort is normal. No respiratory distress.     Breath sounds: Normal breath sounds. No wheezing.  Abdominal:     General: Bowel sounds are normal. There is no distension.     Palpations: Abdomen is soft. There is no mass.     Tenderness: There is no abdominal tenderness. There is no guarding or rebound.  Musculoskeletal:        General: No tenderness. Normal range of motion.     Cervical back: Normal range of motion and neck supple.  Skin:    General: Skin  is warm.  Neurological:     Mental Status: He is alert and oriented to person, place, and time.  Psychiatric:        Mood and Affect: Affect normal.      LABORATORY DATA:  I have reviewed the data as listed Lab Results  Component Value Date   WBC 6.6 07/29/2019   HGB 16.3 07/29/2019   HCT 47.4 07/29/2019   MCV 91.5 07/29/2019   PLT 241 07/29/2019   Recent Labs    06/25/19 1629  NA 136  K 3.8  CL 100  CO2 25  GLUCOSE 109*  BUN 21  CREATININE 1.06  CALCIUM 9.4  GFRNONAA >60  GFRAA >60  PROT 7.3  ALBUMIN 4.4  AST 36  ALT 26  ALKPHOS 63  BILITOT 0.9    RADIOGRAPHIC STUDIES: I have personally reviewed the radiological images as listed and agreed with the findings in the report. CT BIOPSY  Result Date: 07/29/2019 INDICATION: History of prostate cancer, now with hypermetabolic mesenteric mass worrisome for lymphoma. Please perform CT-guided biopsy for tissue diagnostic purposes. EXAM: CT-GUIDED BIOPSY OF HYPERMETABOLIC MESENTERIC MASS COMPARISON:  PET-CT-07/03/2019; CT abdomen and pelvis-06/25/2019 MEDICATIONS: None. ANESTHESIA/SEDATION: Fentanyl 50 mcg IV; Versed 1 mg IV Sedation time: 22 minutes; The patient was continuously monitored during the procedure by the interventional radiology nurse under my direct supervision. CONTRAST:  None. COMPLICATIONS: None immediate. PROCEDURE: Informed consent was obtained from the patient following an explanation of the procedure, risks, benefits and alternatives. A time out was performed prior to the initiation of the procedure. The patient was positioned right lateral decubitus on the CT table and a limited CT was performed for procedural planning demonstrating unchanged size and appearance of the approximately 3.6 x 3.0 cm right-sided mesenteric mass (image 10, series 3). The procedure was planned. The operative site was prepped and draped in the usual sterile fashion. Appropriate trajectory was confirmed with a 22 gauge spinal needle  after the adjacent tissues were anesthetized with 1% Lidocaine with epinephrine. Under intermittent CT guidance, a 17 gauge coaxial needle was advanced into the peripheral aspect of the mesenteric mass. Appropriate positioning was confirmed and approximately 8 core needle biopsy samples were obtained with an 18 gauge core needle biopsy device. Note, multiple samples were  attempted to be acquired given scan tissue acquisition. The co-axial needle was removed following administration of a Gel-Foam slurry and superficial hemostasis was achieved with manual compression. A limited postprocedural CT was negative for hemorrhage or additional complication. A dressing was placed. The patient tolerated the procedure well without immediate postprocedural complication. IMPRESSION: Technically successful CT guided core needle biopsy of hypermetabolic right-sided mesenteric mass via posterior approach. Electronically Signed   By: Sandi Mariscal M.D.   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  However would need excisional biopsy for confirmation.  Also discussed option of surveillance/biopsy on progression.  #Also discussed with patient and wife-that if this is low-grade lymphoma would recommend surveillance rather than treatment.  However final recommendations based upon final excisional pathology.  #Patient and wife anxious/interested in proceeding with biopsy-we will refer to Dr. Bary Castilla;   #Prostate cancer-s/p RP [2017].  PSA normal  # DISPOSITION: call wife/ will call for appt  # referral to Dr.Byrnett re: Lymphoma/needs excisional Biopsy #Follow-up to be  decided-Dr. B   All questions were answered. The patient knows to call the clinic with any problems, questions or concerns.    Cammie Sickle, MD 08/06/2019 8:19 AM

## 2019-08-05 NOTE — Assessment & Plan Note (Addendum)
#    Mesenteric mass 3.5 cm-close to IVC-incidental; CT-guided core biopsy-insufficient tissue; based on flow cytometry CD10 positive B-cell lymphoma; PET scan SUV around 8  #Reviewed the pathology imaging with the patient and wife in detail-concerning for non-Hodgkin's lymphoma/possible follicular.  However would need excisional biopsy for confirmation.  Also discussed option of surveillance/biopsy on progression.  #Also discussed with patient and wife-that if this is low-grade lymphoma would recommend surveillance rather than treatment.  However final recommendations based upon final excisional pathology.  #Patient and wife anxious/interested in proceeding with biopsy-we will refer to Dr. Bary Castilla;   #Prostate cancer-s/p RP [2017].  PSA normal  # DISPOSITION: call wife/ will call for appt  # referral to Dr.Byrnett re: Lymphoma/needs excisional Biopsy #Follow-up to be decided-Dr. B

## 2019-08-06 ENCOUNTER — Telehealth: Payer: Self-pay | Admitting: Internal Medicine

## 2019-08-06 NOTE — Telephone Encounter (Signed)
I Spoke to patient's wife Patrick Pena-regarding referral to surgery.  She will inform us of her choice of surgeon.

## 2019-08-07 ENCOUNTER — Encounter: Payer: Self-pay | Admitting: *Deleted

## 2019-08-08 ENCOUNTER — Telehealth: Payer: Self-pay | Admitting: *Deleted

## 2019-08-08 NOTE — Telephone Encounter (Signed)
Wife called and states that they have decided to speak with patient PCP about who they want to see and that she did not understand why Dr Bary Castilla did not want to see patient.. Please return her call [phone]

## 2019-08-11 ENCOUNTER — Other Ambulatory Visit: Payer: Self-pay | Admitting: General Surgery

## 2019-08-11 NOTE — Progress Notes (Signed)
Patient ID: Patrick Pena is a 69 y.o. male.  HPI  The following portions of the patient's history were reviewed and updated as appropriate.  This a new patient is here today for: office visit. Patient has been referred today by Dr. Rogue Pena for evaluation of mesenteric mass and to discuss surgery. Patient reports on 06-25-19 he was knocked off a ladder when a limb he was trimming with a chain saw came back and hit him, knocking him to the ground.  He had no head trauma.  CT of the abdomen and pelvis was obtained with the incidental finding of a mesenteric mass.   This case had been presented at the [facility] tumor board 10 days ago, and during that discussion the interventional radiology service thought that they could biopsy the area.  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  He is seen now to discuss surgical exploration.  The patient was entirely asymptomatic prior to his fall.  He had no fever, weight loss, night sweats, flushing episodes or change in appetite.  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  The patient is accompanied today by his wife, Patrick Pena.   Prior to presentation today I had reviewed his various imaging studies and discussed his case with medical oncology on 2 prior occasions.   The patient reports he underwent a robotic prostatectomy in 2017 and has done very well.  By report his PSA levels remain undetectable.   Chief Complaint  Patient presents with  . Treatment Plan Discussion    mesenteric mass     BP 148/72   Pulse 60   Temp 36.2 C (97.1 F)   Ht 172.7 cm (5' 8")   Wt 83.9 kg (185 lb)   SpO2 95%   BMI 28.13 kg/m   Past Medical History:  Diagnosis Date  . Allergic rhinitis 05/04/2015  . Allergic state   . Arthritis   . Carotid bruit   . Fatigue   . GERD  (gastroesophageal reflux disease)   . Hearing decreased 03/26/2013  . Hypercholesterolemia   . Hypertension   . Prostate cancer (CMS-HCC)      Past Surgical History:  Procedure Laterality Date  . PELVIC LYMPHADENECTOMY Bilateral 02/04/2015  . PROSTATE SURGERY  02/04/2015       Social History   Socioeconomic History  . Marital status: Married    Spouse name: Not on file  . Number of children: 2  . Years of education: Not on file  . Highest education level: Not on file  Occupational History  . Occupation: Dealer  Tobacco Use  . Smoking status: Never Smoker  . Smokeless tobacco: Never Used  Substance and Sexual Activity  . Alcohol use: Yes    Comment: 3-4 oz per week   . Drug use: No  . Sexual activity: Yes    Partners: Female  Other Topics Concern  . Not on file  Social History Narrative   Patient is married. Two children. Four grandchildren. Pt works as a Pension scheme manager:   . Difficulty of Paying Living Expenses:   Food Insecurity:   . Worried About Charity fundraiser in the Last Year:   . [place] in the Last Year:  Transportation Needs:   . Film/video editor (Medical):   Marland Kitchen Lack of Transportation (Non-Medical):      Allergies  Allergen Reactions  . Lipitor [Atorvastatin] Headache and Muscle Pain    Current Outpatient Medications  Medication Sig Dispense Refill  . acetaminophen (TYLENOL) 500 MG tablet Take 1,000 mg by mouth every 6 (six) hours as needed       . aspirin 81 MG EC tablet Take 81 mg by mouth once daily. Reported on 12/31/2014     . calcium carbonate-vitamin D3 (CALCIUM 600 + D,3,) 600 mg(1,555m) -200 unit tablet Take 1 tablet by mouth 2 (two) times daily with meals    . docusate (COLACE) 100 MG capsule Take 200 mg by mouth 2 (two) times daily as needed       . famotidine (PEPCID) 40 MG tablet Take 1 tablet (40 mg total) by mouth nightly 90 tablet 3  . lisinopriL (ZESTRIL)  5 MG tablet Take 1 tablet (5 mg total) by mouth once daily 90 tablet 3  . metoprolol tartrate (LOPRESSOR) 25 MG tablet Take 0.5 tablets (12.5 mg total) by mouth 2 (two) times daily 90 tablet 3  . multivit-mins no.63/iron/folic (M-VIT ORAL) Take 2 tablets by mouth    . simvastatin (ZOCOR) 10 MG tablet Take 1 tablet (10 mg total) by mouth nightly 90 tablet 3   No current facility-administered medications for this visit.    Family History  Problem Relation Age of Onset  . Hyperlipidemia (Elevated cholesterol) Other   . High blood pressure (Hypertension) Other   . Diabetes type II Other   . Stroke Other   . Kidney cancer Sister      Review of Systems  Constitutional: Negative for chills and fever.  Respiratory: Negative for cough.        Objective:   Physical Exam Constitutional:      Appearance: Normal appearance.  HENT:     Head: Normocephalic.  Eyes:     Extraocular Movements: Extraocular movements intact.     Pupils: Pupils are equal, round, and reactive to light.  Cardiovascular:     Rate and Rhythm: Normal rate and regular rhythm.     Pulses: Normal pulses.     Heart sounds: Normal heart sounds.  Pulmonary:     Effort: Pulmonary effort is normal.     Breath sounds: Normal breath sounds.  Abdominal:     General: Abdomen is flat. Bowel sounds are normal.     Palpations: Abdomen is soft.     Tenderness: There is no abdominal tenderness.     Hernia: There is no hernia in the ventral area.    Musculoskeletal:     Cervical back: Normal range of motion and neck supple.  Lymphadenopathy:     Upper Body:     Right upper body: No supraclavicular or axillary adenopathy.     Left upper body: No supraclavicular or axillary adenopathy.     Lower Body: No right inguinal adenopathy. No left inguinal adenopathy.  Skin:    General: Skin is warm and dry.  Neurological:     Mental Status: He is alert and oriented to person, place, and time.  Psychiatric:        Mood and  Affect: Mood normal.        Behavior: Behavior normal.        Thought Content: Thought content normal.        Judgment: Judgment normal.    Labs and Radiology:  June 25, 2019 CT of the abdomen and pelvis.  MPRESSION: 1. Acute nondisplaced mildly comminuted fracture of the right inferior pubic ramus. 2. Subtle nondisplaced fracture of the superior aspect of the left pubic body. 3. A 3.5 x 3.1 cm soft tissue mass right anterolateral to the SVC within the root of the mesentery. Mild mesenteric lymphadenopathy. Differential considerations include malignancy such as metastatic disease or lymphoma. Recommend oncology consultation. 4. Aortic Atherosclerosis (ICD10-I70.0).   PET/CT dated July 03, 2019: 1. Mesenteric adenopathy in the abdomen and pelvis is primarily Deauville 4 although there is a dominant right abdominal mesenteric mass measuring 3.8 by 3.3 cm which is Deauville 5. The pattern and distribution favors lymphoma. 2. Left thyroid lobe lesions are probably benign based on negative PET-CT characteristics, but current guidelines still call for thyroid ultrasound as they do not take into account negative PET-CT evidence. 3. A borderline enlarged left upper paratracheal lymph node has Deauville 3 activity. 4. Other imaging findings of potential clinical significance: Aortic Atherosclerosis (ICD10-I70.0). Coronary atherosclerosis. Small left scrotal hydrocele. Healing pelvic fractures including the right sacral ala, right pubic rami, and left pubic bone.  CEA: Normal at 1.9.  PSA, less than 0.01.  CBC at the time of trauma showed a hemoglobin of 16.1 with a white blood cell count of 15,700.  Follow-up laboratory studies 2 days later showed a hemoglobin of 14.6 with a normal MCV and a white blood cell count of 10,800.  Normal platelet count.  Chromogranin A determined on July 27, 2019: Mildly elevated at 113.6 (less than 101.8 is normal).  CT-guided biopsy dated July 29, 2019: SURGICAL PATHOLOGY  CASE: [id number]  PATIENT: Cecilie Kicks  Surgical Pathology Report      Specimen Submitted:  A. Mesenteric mass   Clinical History: 69 year old male with history of prostate cancer, now  with hypermetabolic 3.5 cm mesenteric mass; Chromogranin A mildly  elevated, PSA is within normal range       DIAGNOSIS:  A. 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   Comment:  Sections of the biopsy material demonstrate fragmented and limited  lymphoid tissue.  Material was submitted to [facility] for Molecular Biology and  Pathology for flow cytometric analysis. Per report a CD10 positive  monoclonal kappa B cell population was detected. The specimen was  limited by low cellularity and low viability (see scanned report in  [facility]).  Based on the flow cytometric results follicular lymphoma is a diagnostic  consideration. An excisional biopsy is required for definitive  classification and grading.     Assessment:     Imaging suggestive of lymphoma, asymptomatic.    Plan:     Unfortunately, the patient has no readily available peripheral nodes for evaluation.  The indications for abdominal exploration for node biopsy was reviewed.  With findings of multiple nodes, although one dominant area in the base of the mesentery, there is no indication for lymphadenectomy.  The minimally elevated Chromogranin A level is noted, but the patient has no clinical symptoms.  Indications for exploration, risks including bleeding and infection as well as delayed return of GI function was discussed.  I anticipate the patient will be able to be discharged home on postoperative day 1, but this will be dependent on his ability to tolerate a diet and good pain control.  To expedite his care, arrangements have been made for surgery on August 14, 2019.  Over an hour had been  spent on preparation for the  patient visit, clarification of biopsy requirements with the referring oncologist post visit and patient education.    Patient to follow up as scheduled and is aware to call for any new issues or concerns.  Entered by Ledell Noss, CMA, acting as a scribe for Dr. Hervey Ard, MD.   The documentation recorded by the scribe accurately reflects the service I personally performed and the decisions made by me.   Robert Bellow, MD FACS

## 2019-08-12 ENCOUNTER — Encounter: Payer: Self-pay | Admitting: *Deleted

## 2019-08-12 ENCOUNTER — Other Ambulatory Visit: Payer: Self-pay

## 2019-08-12 ENCOUNTER — Other Ambulatory Visit
Admission: RE | Admit: 2019-08-12 | Discharge: 2019-08-12 | Disposition: A | Discharge status: Home / Self Care | Payer: Medicare PPO | Source: Ambulatory Visit | Attending: General Surgery | Admitting: General Surgery

## 2019-08-12 ENCOUNTER — Encounter
Admission: RE | Admit: 2019-08-12 | Discharge: 2019-08-12 | Disposition: A | Discharge status: Home / Self Care | Payer: Medicare PPO | Source: Ambulatory Visit | Attending: General Surgery | Admitting: General Surgery

## 2019-08-12 DIAGNOSIS — Z01810 Encounter for preprocedural cardiovascular examination: Secondary | ICD-10-CM | POA: Diagnosis not present

## 2019-08-12 DIAGNOSIS — Z01818 Encounter for other preprocedural examination: Secondary | ICD-10-CM | POA: Insufficient documentation

## 2019-08-12 DIAGNOSIS — Z20822 Contact with and (suspected) exposure to covid-19: Secondary | ICD-10-CM | POA: Insufficient documentation

## 2019-08-12 LAB — SARS CORONAVIRUS 2 (TAT 6-24 HRS): SARS Coronavirus 2: NEGATIVE

## 2019-08-12 NOTE — Patient Instructions (Addendum)
Your procedure is scheduled on: Thursday, August 12 Report to Day Surgery on the 2nd floor of the [facility]. To find out your arrival time, please call [phone] between 1PM - 3PM on: Wednesday, August 11  REMEMBER: Instructions that are not followed completely may result in serious medical risk, up to and including death; or upon the discretion of your surgeon and anesthesiologist your surgery may need to be rescheduled.  Do not eat food after midnight the night before surgery.  No gum chewing, lozengers or hard candies.  You may however, drink CLEAR liquids up to 2 hours before you are scheduled to arrive for your surgery. Do not drink anything within 2 hours of your scheduled arrival time.  Clear liquids include: - water  - apple juice without pulp - gatorade (not RED) - black coffee or tea (Do NOT add milk or creamers to the coffee or tea) Do NOT drink anything that is not on this list.  TAKE THESE MEDICATIONS THE MORNING OF SURGERY WITH A SIP OF WATER:  1.  Famotidine - (take one the night before and one on the morning of surgery - helps to prevent nausea after surgery.) 2.  Metoprolol  Stop aspirin and Anti-inflammatories (NSAIDS) such as Advil, Aleve, Ibuprofen, Motrin, Naproxen, Naprosyn and Aspirin based products such as Excedrin, Goodys Powder, BC Powder. (May take Tylenol or Acetaminophen if needed.)  Stop ANY OVER THE COUNTER supplements until after surgery. (calcium) (May continue multivitamin.)  No Alcohol for 24 hours before or after surgery.  On the morning of surgery brush your teeth with toothpaste and water, you may rinse your mouth with mouthwash if you wish. Do not swallow any toothpaste or mouthwash.  Do not wear jewelry.  Do not wear lotions, powders, or perfumes.   Do not shave 48 hours prior to surgery.   Hearing aids may not be worn into surgery.  Do not bring valuables to the hospital. [facility] is not responsible for any missing/lost  belongings or valuables.   Use CHG Soap as directed on instruction sheet.  Notify your doctor if there is any change in your medical condition (cold, fever, infection).  Wear comfortable clothing (specific to your surgery type) to the hospital.  Plan for stool softeners for home use; pain medications have a tendency to cause constipation. You can also help prevent constipation by eating foods high in fiber such as fruits and vegetables and drinking plenty of fluids as your diet allows.  After surgery, you can help prevent lung complications by doing breathing exercises.  Take deep breaths and cough every 1-2 hours. Your doctor may order a device called an Incentive Spirometer to help you take deep breaths. When coughing or sneezing, hold a pillow firmly against your incision with both hands. This is called "splinting." Doing this helps protect your incision. It also decreases belly discomfort.  If you are being admitted to the hospital overnight, leave your suitcase in the car. After surgery it may be brought to your room.  If you are being discharged the day of surgery, you will not be allowed to drive home. You will need a responsible adult (18 years or older) to drive you home and stay with you that night.   If you are taking public transportation, you will need to have a responsible adult (18 years or older) with you. Please confirm with your physician that it is acceptable to use public transportation.   Please call the [facility] at [phone]  [phone] if you have any questions about these instructions.  Visitation Policy:  Patients undergoing a surgery or procedure may have one family member or support person with them as long as that person is not COVID-19 positive or experiencing its symptoms.  That person may remain in the waiting area during the procedure.  Inpatient Visitation Update:   In an effort to ensure the safety of our team members and our patients, we  are implementing a change to our visitation policy:  Effective Monday, Aug. 9, at 7 a.m., inpatients will be allowed one support person.  o The support person may change daily.  o The support person must pass our screening, gel in and out, and wear a mask at all times, including in the patient's room.  o Patients must also wear a mask when staff or their support person are in the room.  o The list of exceptions to the visitation policy remains unchanged.  o Masking is required regardless of vaccination status.  Systemwide, no visitors 17 or younger.

## 2019-08-12 NOTE — Telephone Encounter (Signed)
Patient saw Dr. Bary Castilla yesterday. Per Chart- surgery is already scheduled/planned.

## 2019-08-13 MED ORDER — CEFAZOLIN SODIUM-DEXTROSE 2-4 GM/100ML-% IV SOLN
2.0000 g | INTRAVENOUS | Status: AC
  Administered 2019-08-14: 2 g via INTRAVENOUS

## 2019-08-14 ENCOUNTER — Inpatient Hospital Stay
Admission: RE | Admit: 2019-08-14 | Discharge: 2019-08-14 | DRG: 825 | Disposition: A | Discharge status: Home / Self Care | Payer: Medicare PPO | Attending: General Surgery | Admitting: General Surgery

## 2019-08-14 ENCOUNTER — Other Ambulatory Visit: Payer: Self-pay

## 2019-08-14 ENCOUNTER — Inpatient Hospital Stay: Payer: Medicare PPO | Admitting: Certified Registered"

## 2019-08-14 ENCOUNTER — Encounter
Admission: RE | Disposition: A | Discharge status: Home / Self Care | Payer: Self-pay | Source: Home / Self Care | Attending: General Surgery

## 2019-08-14 ENCOUNTER — Encounter: Payer: Self-pay | Admitting: General Surgery

## 2019-08-14 DIAGNOSIS — Z20822 Contact with and (suspected) exposure to covid-19: Secondary | ICD-10-CM | POA: Diagnosis present

## 2019-08-14 DIAGNOSIS — I10 Essential (primary) hypertension: Secondary | ICD-10-CM | POA: Diagnosis present

## 2019-08-14 DIAGNOSIS — M1990 Unspecified osteoarthritis, unspecified site: Secondary | ICD-10-CM | POA: Diagnosis present

## 2019-08-14 DIAGNOSIS — Z7982 Long term (current) use of aspirin: Secondary | ICD-10-CM

## 2019-08-14 DIAGNOSIS — Z888 Allergy status to other drugs, medicaments and biological substances status: Secondary | ICD-10-CM | POA: Diagnosis not present

## 2019-08-14 DIAGNOSIS — Z8051 Family history of malignant neoplasm of kidney: Secondary | ICD-10-CM | POA: Diagnosis not present

## 2019-08-14 DIAGNOSIS — C8590 Non-Hodgkin lymphoma, unspecified, unspecified site: Secondary | ICD-10-CM | POA: Diagnosis present

## 2019-08-14 DIAGNOSIS — Z823 Family history of stroke: Secondary | ICD-10-CM | POA: Diagnosis not present

## 2019-08-14 DIAGNOSIS — E7800 Pure hypercholesterolemia, unspecified: Secondary | ICD-10-CM | POA: Diagnosis present

## 2019-08-14 DIAGNOSIS — K6389 Other specified diseases of intestine: Secondary | ICD-10-CM | POA: Diagnosis present

## 2019-08-14 DIAGNOSIS — Z8249 Family history of ischemic heart disease and other diseases of the circulatory system: Secondary | ICD-10-CM | POA: Diagnosis not present

## 2019-08-14 DIAGNOSIS — Z9079 Acquired absence of other genital organ(s): Secondary | ICD-10-CM

## 2019-08-14 DIAGNOSIS — Z79899 Other long term (current) drug therapy: Secondary | ICD-10-CM

## 2019-08-14 DIAGNOSIS — Z8546 Personal history of malignant neoplasm of prostate: Secondary | ICD-10-CM

## 2019-08-14 DIAGNOSIS — Z833 Family history of diabetes mellitus: Secondary | ICD-10-CM | POA: Diagnosis not present

## 2019-08-14 DIAGNOSIS — C8293 Follicular lymphoma, unspecified, intra-abdominal lymph nodes: Secondary | ICD-10-CM | POA: Diagnosis present

## 2019-08-14 DIAGNOSIS — K219 Gastro-esophageal reflux disease without esophagitis: Secondary | ICD-10-CM | POA: Diagnosis present

## 2019-08-14 HISTORY — PX: LAPAROTOMY: SHX154

## 2019-08-14 SURGERY — LAPAROTOMY, EXPLORATORY
Anesthesia: General | Site: Abdomen

## 2019-08-14 MED ORDER — GLYCOPYRROLATE 0.2 MG/ML IJ SOLN
INTRAMUSCULAR | Status: DC | PRN
  Administered 2019-08-14: .2 mg via INTRAVENOUS

## 2019-08-14 MED ORDER — CHLORHEXIDINE GLUCONATE 0.12 % MT SOLN
15.0000 mL | Freq: Once | OROMUCOSAL | Status: AC
  Administered 2019-08-14: 15 mL via OROMUCOSAL

## 2019-08-14 MED ORDER — FENTANYL CITRATE (PF) 100 MCG/2ML IJ SOLN
INTRAMUSCULAR | Status: DC | PRN
  Administered 2019-08-14 (×2): 50 ug via INTRAVENOUS

## 2019-08-14 MED ORDER — ACETAMINOPHEN 10 MG/ML IV SOLN
INTRAVENOUS | Status: DC | PRN
  Administered 2019-08-14: 1000 mg via INTRAVENOUS

## 2019-08-14 MED ORDER — PROPOFOL 10 MG/ML IV BOLUS
INTRAVENOUS | Status: DC | PRN
  Administered 2019-08-14: 150 mg via INTRAVENOUS

## 2019-08-14 MED ORDER — FENTANYL CITRATE (PF) 100 MCG/2ML IJ SOLN
INTRAMUSCULAR | Status: AC
  Administered 2019-08-14: 25 ug via INTRAVENOUS
  Filled 2019-08-14: qty 2

## 2019-08-14 MED ORDER — EPHEDRINE SULFATE 50 MG/ML IJ SOLN
INTRAMUSCULAR | Status: DC | PRN
  Administered 2019-08-14: 10 mg via INTRAVENOUS

## 2019-08-14 MED ORDER — ROCURONIUM BROMIDE 100 MG/10ML IV SOLN
INTRAVENOUS | Status: DC | PRN
  Administered 2019-08-14: 40 mg via INTRAVENOUS
  Administered 2019-08-14: 10 mg via INTRAVENOUS

## 2019-08-14 MED ORDER — MIDAZOLAM HCL 2 MG/2ML IJ SOLN
INTRAMUSCULAR | Status: DC | PRN
  Administered 2019-08-14: 2 mg via INTRAVENOUS

## 2019-08-14 MED ORDER — HYDROCODONE-ACETAMINOPHEN 5-325 MG PO TABS
ORAL_TABLET | ORAL | Status: AC
  Filled 2019-08-14: qty 1

## 2019-08-14 MED ORDER — KETOROLAC TROMETHAMINE 15 MG/ML IJ SOLN
INTRAMUSCULAR | Status: DC | PRN
  Administered 2019-08-14: 15 mg via INTRAVENOUS

## 2019-08-14 MED ORDER — PROPOFOL 10 MG/ML IV BOLUS
INTRAVENOUS | Status: AC
  Filled 2019-08-14: qty 20

## 2019-08-14 MED ORDER — ONDANSETRON HCL 4 MG/2ML IJ SOLN
INTRAMUSCULAR | Status: AC
  Filled 2019-08-14: qty 2

## 2019-08-14 MED ORDER — FENTANYL CITRATE (PF) 100 MCG/2ML IJ SOLN
25.0000 ug | INTRAMUSCULAR | Status: DC | PRN
  Administered 2019-08-14: 25 ug via INTRAVENOUS

## 2019-08-14 MED ORDER — CEFAZOLIN SODIUM-DEXTROSE 2-4 GM/100ML-% IV SOLN
INTRAVENOUS | Status: AC
  Filled 2019-08-14: qty 100

## 2019-08-14 MED ORDER — MIDAZOLAM HCL 2 MG/2ML IJ SOLN
INTRAMUSCULAR | Status: AC
  Filled 2019-08-14: qty 2

## 2019-08-14 MED ORDER — HYDROCODONE-ACETAMINOPHEN 5-325 MG PO TABS: 1.0000 | ORAL_TABLET | ORAL | 0 refills | Status: DC | PRN

## 2019-08-14 MED ORDER — LACTATED RINGERS IV SOLN: INTRAVENOUS | Status: DC

## 2019-08-14 MED ORDER — BUPIVACAINE-EPINEPHRINE 0.5% -1:200000 IJ SOLN
INTRAMUSCULAR | Status: DC | PRN
  Administered 2019-08-14: 30 mL

## 2019-08-14 MED ORDER — LIDOCAINE HCL (PF) 2 % IJ SOLN
INTRAMUSCULAR | Status: AC
  Filled 2019-08-14: qty 10

## 2019-08-14 MED ORDER — ORAL CARE MOUTH RINSE: 15.0000 mL | Freq: Once | OROMUCOSAL | Status: AC

## 2019-08-14 MED ORDER — ROCURONIUM BROMIDE 10 MG/ML (PF) SYRINGE
PREFILLED_SYRINGE | INTRAVENOUS | Status: AC
  Filled 2019-08-14: qty 10

## 2019-08-14 MED ORDER — LIDOCAINE HCL (CARDIAC) PF 100 MG/5ML IV SOSY
PREFILLED_SYRINGE | INTRAVENOUS | Status: DC | PRN
  Administered 2019-08-14: 50 mg via INTRAVENOUS

## 2019-08-14 MED ORDER — HYDROCODONE-ACETAMINOPHEN 5-325 MG PO TABS
1.0000 | ORAL_TABLET | Freq: Once | ORAL | Status: AC
  Administered 2019-08-14: 1 via ORAL

## 2019-08-14 MED ORDER — DEXAMETHASONE SODIUM PHOSPHATE 10 MG/ML IJ SOLN
INTRAMUSCULAR | Status: AC
  Filled 2019-08-14: qty 1

## 2019-08-14 MED ORDER — ACETAMINOPHEN 10 MG/ML IV SOLN
INTRAVENOUS | Status: AC
  Filled 2019-08-14: qty 100

## 2019-08-14 MED ORDER — FENTANYL CITRATE (PF) 100 MCG/2ML IJ SOLN
INTRAMUSCULAR | Status: AC
  Filled 2019-08-14: qty 2

## 2019-08-14 MED ORDER — ONDANSETRON HCL 4 MG/2ML IJ SOLN
4.0000 mg | Freq: Once | INTRAMUSCULAR | Status: AC | PRN
  Administered 2019-08-14: 4 mg via INTRAVENOUS

## 2019-08-14 MED ORDER — SUGAMMADEX SODIUM 200 MG/2ML IV SOLN
INTRAVENOUS | Status: DC | PRN
  Administered 2019-08-14: 200 mg via INTRAVENOUS

## 2019-08-14 MED ORDER — BUPIVACAINE-EPINEPHRINE (PF) 0.5% -1:200000 IJ SOLN
INTRAMUSCULAR | Status: AC
  Filled 2019-08-14: qty 30

## 2019-08-14 MED ORDER — KETOROLAC TROMETHAMINE 30 MG/ML IJ SOLN
INTRAMUSCULAR | Status: AC
  Filled 2019-08-14: qty 1

## 2019-08-14 MED ORDER — EPHEDRINE 5 MG/ML INJ
INTRAVENOUS | Status: AC
  Filled 2019-08-14: qty 10

## 2019-08-14 MED ORDER — GLYCOPYRROLATE 0.2 MG/ML IJ SOLN
INTRAMUSCULAR | Status: AC
  Filled 2019-08-14: qty 1

## 2019-08-14 MED ORDER — CHLORHEXIDINE GLUCONATE 0.12 % MT SOLN
OROMUCOSAL | Status: AC
  Filled 2019-08-14: qty 15

## 2019-08-14 MED ORDER — ONDANSETRON HCL 4 MG/2ML IJ SOLN
INTRAMUSCULAR | Status: DC | PRN
  Administered 2019-08-14: 4 mg via INTRAVENOUS

## 2019-08-14 MED ORDER — DEXAMETHASONE SODIUM PHOSPHATE 10 MG/ML IJ SOLN
INTRAMUSCULAR | Status: DC | PRN
  Administered 2019-08-14: 10 mg via INTRAVENOUS

## 2019-08-14 SURGICAL SUPPLY — 47 items
APL PRP STRL LF DISP 70% ISPRP (MISCELLANEOUS) ×1
APL SKNCLS STERI-STRIP NONHPOA (GAUZE/BANDAGES/DRESSINGS) ×1
BENZOIN TINCTURE PRP APPL 2/3 (GAUZE/BANDAGES/DRESSINGS) ×2 IMPLANT
BLADE SURG 15 STRL SS SAFETY (BLADE) ×4 IMPLANT
BULB RESERV EVAC DRAIN JP 100C (MISCELLANEOUS) ×1 IMPLANT
CANISTER SUCT 1200ML W/VALVE (MISCELLANEOUS) ×1 IMPLANT
CANISTER SUCT 3000ML PPV (MISCELLANEOUS) ×2 IMPLANT
CHLORAPREP W/TINT 26 (MISCELLANEOUS) ×3 IMPLANT
CLOSURE WOUND 1/2 X4 (GAUZE/BANDAGES/DRESSINGS) ×1
COVER CLAMP SIL LG PBX B (MISCELLANEOUS) ×3 IMPLANT
COVER WAND RF STERILE (DRAPES) ×1 IMPLANT
DRAIN CHANNEL JP 15F RND 16 (MISCELLANEOUS) ×1 IMPLANT
DRAPE LAPAROTOMY 100X77 ABD (DRAPES) ×3 IMPLANT
DRSG OPSITE POSTOP 4X6 (GAUZE/BANDAGES/DRESSINGS) ×2 IMPLANT
DRSG TELFA 3X8 NADH (GAUZE/BANDAGES/DRESSINGS) IMPLANT
ELECT BLADE 6 FLAT ULTRCLN (ELECTRODE) ×1 IMPLANT
ELECT REM PT RETURN 9FT ADLT (ELECTROSURGICAL) ×3
ELECTRODE REM PT RTRN 9FT ADLT (ELECTROSURGICAL) ×1 IMPLANT
GAUZE SPONGE 4X4 12PLY STRL (GAUZE/BANDAGES/DRESSINGS) ×3 IMPLANT
GLOVE BIO SURGEON STRL SZ7.5 (GLOVE) ×3 IMPLANT
GLOVE INDICATOR 8.0 STRL GRN (GLOVE) ×3 IMPLANT
GOWN STRL REUS W/ TWL LRG LVL3 (GOWN DISPOSABLE) ×2 IMPLANT
GOWN STRL REUS W/TWL LRG LVL3 (GOWN DISPOSABLE) ×6
HOLDER FOLEY CATH W/STRAP (MISCELLANEOUS) ×1 IMPLANT
KIT TURNOVER KIT A (KITS) ×3 IMPLANT
LABEL OR SOLS (LABEL) ×3 IMPLANT
NS IRRIG 1000ML POUR BTL (IV SOLUTION) ×3 IMPLANT
PACK BASIN MAJOR ARMC (MISCELLANEOUS) ×3 IMPLANT
PAD DRESSING TELFA 3X8 NADH (GAUZE/BANDAGES/DRESSINGS) ×1 IMPLANT
RETRACTOR WOUND ALXS 18CM SML (MISCELLANEOUS) IMPLANT
RTRCTR WOUND ALEXIS O 18CM SML (MISCELLANEOUS) ×3
SET YANKAUER POOLE SUCT (MISCELLANEOUS) ×1 IMPLANT
SPONGE LAP 18X18 RF (DISPOSABLE) ×3 IMPLANT
STAPLER SKIN PROX 35W (STAPLE) ×3 IMPLANT
STRIP CLOSURE SKIN 1/2X4 (GAUZE/BANDAGES/DRESSINGS) ×1 IMPLANT
SUT ETH BLK MONO 3 0 FS 1 12/B (SUTURE) ×1 IMPLANT
SUT MAXON ABS #0 GS21 30IN (SUTURE) ×4 IMPLANT
SUT PROLENE 0 CT 1 30 (SUTURE) ×6 IMPLANT
SUT SILK 3-0 (SUTURE) ×3 IMPLANT
SUT VIC AB 2-0 BRD 54 (SUTURE) ×3 IMPLANT
SUT VIC AB 2-0 CT1 36 (SUTURE) ×2 IMPLANT
SUT VIC AB 3-0 SH 27 (SUTURE) ×12
SUT VIC AB 3-0 SH 27X BRD (SUTURE) ×2 IMPLANT
SUT VIC AB 4-0 FS2 27 (SUTURE) ×2 IMPLANT
SUT VICRYL 3-0 SH-1 18IN (SUTURE) ×1 IMPLANT
SYR BULB IRRIG 60ML STRL (SYRINGE) ×2 IMPLANT
TRAY FOLEY MTR SLVR 16FR STAT (SET/KITS/TRAYS/PACK) ×1 IMPLANT

## 2019-08-14 NOTE — H&P (Signed)
No change in clinical history or exam.  For laparotomy and node excision.

## 2019-08-14 NOTE — Op Note (Signed)
Preoperative diagnosis: Lymphoma, need for tissue confirmation.  Postoperative diagnosis: Same.  Operative procedure: Exploratory celiotomy, excision of mesenteric mass.  Operating surgeon: Hervey Ard, MD.  Anesthesia: General endotracheal, Marcaine 0.5% with 1: 200,000 units of epinephrine, 30 cc.  Estimated blood loss: Less than 5 cc.  Clinical note: This 69 year old male had an incidental finding of a mesenteric mass on a CT obtained for trauma.  Subsequent evaluation showed multiple areas within the abdomen and no peripheral nodes available for biopsy.  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  He is admitted at this time for planned exploration and biopsy.  Operative note: The patient SCD stockings for DVT prevention.  He received Ancef prior to the procedure.  Hair was removed from the abdomen with clippers prior to presentation of the operating theater.  The abdomen was cleansed with ChloraPrep and draped.  Based on review of the PET scan images there was a 2 cm mass in the small bowel mesentery in the right lower quadrant.  This was felt to be safer for biopsy than the 3 cm mass overlying the SVC.  A transverse incision was made after field block anesthesia.  The skin was incised sharply and remaining dissection with electrocautery.  The anterior rectus sheath was divided with cutting current and the rectus muscle divided with cautery.  The posterior sheath was elevated and incision made in safe entry obtained.  A small Alexis wound protector was placed.  The small bowel was eviscerated and there was noted to be a focal area of thickening.  This was explored and a 1 cm mass of irregular thickened tissue was removed.  This was initially prepared to send fresh and then further palpation more distally showed a well-defined tumor nodule on the mesentery.  This was excised.  This was a transmesenteric lesion.  Hemostasis was electrocautery and 3-0 Vicryl  suture ligatures.  The fascial defect was closed with interrupted 3-0 Vicryl sutures.  The specimens were sent fresh for pathologic review and adequate tissue for diagnosis was reported by the pathologist.  The mesenteric defect was closed to prevent internal herniation.  The small bowel contents were returned to the abdomen and the omentum brought over the top of the wound.  The posterior rectus sheath was closed with a running 0 Vicryl suture.  The anterior rectus sheath was closed with interrupted 0 Maxon figure-of-eight sutures.  The adipose layer was closed with a running 2-0 Vicryl suture.  The skin closed with a running 4-0 Vicryl subcuticular suture.  Benzoin and Steri-Strips followed by a honeycomb dressing was applied.  The patient tolerated the procedure well and was taken to recovery in stable condition.

## 2019-08-14 NOTE — Anesthesia Postprocedure Evaluation (Signed)
Anesthesia Post Note  Patient: Patrick Pena  Procedure(s) Performed: EXPLORATORY LAPAROTOMY EXCISION MESENTERIC MASS (N/A Abdomen)  Patient location during evaluation: PACU Anesthesia Type: General Level of consciousness: awake and alert and oriented Pain management: pain level controlled Vital Signs Assessment: post-procedure vital signs reviewed and stable Respiratory status: spontaneous breathing Cardiovascular status: blood pressure returned to baseline Anesthetic complications: no   No complications documented.   Last Vitals:  Vitals:   08/14/19 1500 08/14/19 1515  BP: (!) 144/72   Pulse: (!) 52 67  Resp: (!) 21 (!) 21  Temp: 36.6 C   SpO2: 96% 99%    Last Pain:  Vitals:   08/14/19 1502  TempSrc:   PainSc: 5                  Emory Leaver

## 2019-08-14 NOTE — Discharge Instructions (Signed)

## 2019-08-14 NOTE — Anesthesia Procedure Notes (Signed)
Procedure Name: Intubation Performed by: Ladonya Jerkins, CRNA Pre-anesthesia Checklist: Patient identified, Patient being monitored, Timeout performed, Emergency Drugs available and Suction available Patient Re-evaluated:Patient Re-evaluated prior to induction Oxygen Delivery Method: Circle system utilized Preoxygenation: Pre-oxygenation with 100% oxygen Induction Type: IV induction Ventilation: Mask ventilation without difficulty Laryngoscope Size: McGraph and 4 Grade View: Grade I Tube type: Oral Tube size: 7.5 mm Number of attempts: 1 Airway Equipment and Method: Stylet and Video-laryngoscopy Placement Confirmation: ETT inserted through vocal cords under direct vision,  positive ETCO2 and breath sounds checked- equal and bilateral Secured at: 22 cm Tube secured with: Tape Dental Injury: Teeth and Oropharynx as per pre-operative assessment        

## 2019-08-14 NOTE — Transfer of Care (Signed)
Immediate Anesthesia Transfer of Care Note  Patient: Patrick Pena  Procedure(s) Performed: EXPLORATORY LAPAROTOMY EXCISION MESENTERIC MASS (N/A Abdomen)  Patient Location: PACU  Anesthesia Type:General  Level of Consciousness: sedated  Airway & Oxygen Therapy: Patient Spontanous Breathing and Patient connected to face mask oxygen  Post-op Assessment: Report given to RN and Post -op Vital signs reviewed and stable  Post vital signs: Reviewed  Last Vitals:  Vitals Value Taken Time  BP 155/75 08/14/19 1329  Temp    Pulse 67 08/14/19 1329  Resp 19 08/14/19 1329  SpO2 96 % 08/14/19 1329  Vitals shown include unvalidated device data.  Last Pain:  Vitals:   08/14/19 1036  TempSrc: Temporal  PainSc: 1          Complications: No complications documented.

## 2019-08-14 NOTE — Anesthesia Preprocedure Evaluation (Signed)
Anesthesia Evaluation  Patient identified by MRN, date of birth, ID band Patient awake    Reviewed: Allergy & Precautions, NPO status , Patient's Chart, lab work & pertinent test results  Airway Mallampati: II   Neck ROM: full    Dental   Pulmonary neg pulmonary ROS,    breath sounds clear to auscultation       Cardiovascular hypertension,  Rhythm:regular Rate:Normal     Neuro/Psych    GI/Hepatic Neg liver ROS, GERD  ,  Endo/Other  negative endocrine ROS  Renal/GU negative Renal ROS   Prostate CA.    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 03/13/2013: Decrease in the ability to hear 06/25/2019: Fractured pelvis ([place])     Comment:  from fall off of ladder No date: GERD (gastroesophageal reflux disease) No date: Hypercholesteremia 03/13/2013: Hypercholesterolemia No date: Hypertension No date: Prostate cancer [facility])     Comment:  prostate cancer   Reproductive/Obstetrics                             Anesthesia Physical  Anesthesia Plan  ASA: II  Anesthesia Plan: General   Post-op Pain Management:    Induction: Intravenous  PONV Risk Score and Plan:   Airway Management Planned: Oral ETT  Additional Equipment:   Intra-op Plan:   Post-operative Plan: Extubation in OR  Informed Consent: I have reviewed the patients History and Physical, chart, labs and discussed the procedure including the risks, benefits and alternatives for the proposed anesthesia with the patient or authorized representative who has indicated his/her understanding and acceptance.       Plan Discussed with: CRNA,  Anesthesiologist and Surgeon  Anesthesia Plan Comments:         Anesthesia Quick Evaluation

## 2019-08-15 ENCOUNTER — Encounter: Payer: Self-pay | Admitting: General Surgery

## 2019-08-15 NOTE — Discharge Summary (Signed)
Physician Discharge Summary  Patient ID: Patrick Pena MRN: [id number] DOB/AGE: 1950-01-18 69 y.o.  Admit date: 08/14/2019 Discharge date: 08/14/2019 Admission Diagnoses:  Discharge Diagnoses: Lymphoma Active Problems:   * No active hospital problems. *   Discharged Condition: good  Hospital Course: Patient was admitted for mesenteric node biopsy. Did well and was discharged home the day of the procedure   Consults: None  Significant Diagnostic Studies: pathology  Treatments: IV hydration  Discharge Exam: Blood pressure (!) 152/77, pulse (!) 54, temperature 98 F (36.7 C), temperature source Tympanic, resp. rate 17, SpO2 97 %. General appearance: alert and cooperative GI: soft, non-tender; bowel sounds normal; no masses,  no organomegaly  Disposition: Discharge disposition: 01-Home or Self Care       Discharge Instructions    Diet - low sodium heart healthy   Complete by: As directed    Discharge instructions   Complete by: As directed    Ice to area intermittently for the next 48-72 hours, then heat for comfort.  No driving until pain free.   Tylenol: If needed for soreness.  Aleve: Two tablets twice a day for the next five days, then as needed for comfort.  Norco (Hydrocodone): If needed for pain.  This medication may constipate. ' Laxative of choice if needed.  No driving until pain free.   No lifting over 10 pounds until follow up.   Increase activity slowly   Complete by: As directed      Allergies as of 08/14/2019      Reactions   Atorvastatin    Headache, Muscle Pain      Medication List    TAKE these medications   acetaminophen 500 MG tablet Commonly known as: TYLENOL Take 1,000 mg by mouth every 6 (six) hours as needed for moderate pain.   aspirin EC 81 MG tablet Take 81 mg by mouth daily. Swallow whole.   CALCIUM 600 + D PO Take 1 tablet by mouth 2 (two) times daily.   DEEP BLUE RELIEF EX Apply 1 application topically 2 (two) times  daily as needed (muscle pain.).   famotidine 40 MG tablet Commonly known as: PEPCID Take 40 mg by mouth at bedtime.   HYDROcodone-acetaminophen 5-325 MG tablet Commonly known as: NORCO/VICODIN Take 1 tablet by mouth every 4 (four) hours as needed for moderate pain.   lisinopril 5 MG tablet Commonly known as: ZESTRIL TAKE 1 TABLET BY MOUTH DAILY   metoprolol tartrate 25 MG tablet Commonly known as: LOPRESSOR TAKE 1/2 TABLET BY MOUTH TWICE A DAY   multivitamin tablet Take 1 tablet by mouth daily.   simvastatin 10 MG tablet Commonly known as: ZOCOR Take 10 mg by mouth at bedtime.       Follow-up Information    Ronya [place], Forest Gleason, MD Follow up in 1 week(s).   Specialties: General Surgery, Radiology Why: [phone] August 21, 2019 @ 2:45 pm  Contact information: [place] [address] [phone]               Signed: Robert Bellow 08/15/2019, 8:42 AM

## 2019-08-21 NOTE — H&P (Signed)
Patient ID: Patrick Pena is a 69 y.o. male.  HPI  The following portions of the patient's history were reviewed and updated as appropriate.  This a new patient is here today for: office visit. Patient has been referred today by Dr. Rogue Bussing for evaluation of mesenteric mass and to discuss surgery. Patient reports on 06-25-19 he was knocked off a ladder when a limb he was trimming with a chain saw came back and hit him, knocking him to the ground.  He had no head trauma.  CT of the abdomen and pelvis was obtained with the incidental finding of a mesenteric mass.   This case had been presented at the [facility] tumor board 10 days ago, and during that discussion the interventional radiology service thought that they could biopsy the area.  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  He is seen now to discuss surgical exploration.  The patient was entirely asymptomatic prior to his fall.  He had no fever, weight loss, night sweats, flushing episodes or change in appetite.  He is a Dealer by trade.  Native of [place], [place].  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  The patient is accompanied today by his wife, Patrick Pena.   Prior to presentation today I had reviewed his various imaging studies and discussed his case with medical oncology on 2 prior occasions.   The patient reports he underwent a robotic prostatectomy in 2017 and has done very well.  By report his PSA levels remain undetectable.   Chief Complaint  Patient presents with  . Treatment Plan Discussion    mesenteric mass     BP 148/72   Pulse 60   Temp 36.2 C (97.1 F)   Ht 172.7 cm (_0 )   Wt 83.9 kg (185 lb)   SpO2 95%   BMI 28.13 kg/m       Past Medical History:  Diagnosis Date  . Allergic rhinitis 05/04/2015  . Allergic state   . Arthritis   . Carotid bruit   .  Fatigue   . GERD (gastroesophageal reflux disease)   . Hearing decreased 03/26/2013  . Hypercholesterolemia   . Hypertension   . Prostate cancer (CMS-HCC)           Past Surgical History:  Procedure Laterality Date  . PELVIC LYMPHADENECTOMY Bilateral 02/04/2015  . PROSTATE SURGERY  02/04/2015      Social History          Socioeconomic History  . Marital status: Married    Spouse name: Not on file  . Number of children: 2  . Years of education: Not on file  . Highest education level: Not on file  Occupational History  . Occupation: Dealer  Tobacco Use  . Smoking status: Never Smoker  . Smokeless tobacco: Never Used  Substance and Sexual Activity  . Alcohol use: Yes    Comment: 3-4 oz per week   . Drug use: No  . Sexual activity: Yes    Partners: Female  Other Topics Concern  . Not on file  Social History Narrative   Patient is married. Two children. Four grandchildren. Pt works as a Emergency planning/management officer:   . Difficulty of Paying Living Expenses:   Food Insecurity:   . Worried About Charity fundraiser  in the Last Year:   . [place] in the Last Year:   Transportation Needs:   . Film/video editor (Medical):   Marland Kitchen Lack of Transportation (Non-Medical):            Allergies  Allergen Reactions  . Lipitor [Atorvastatin] Headache and Muscle Pain    Current Medications        Current Outpatient Medications  Medication Sig Dispense Refill  . acetaminophen (TYLENOL) 500 MG tablet Take 1,000 mg by mouth every 6 (six) hours as needed       . aspirin 81 MG EC tablet Take 81 mg by mouth once daily. Reported on 12/31/2014     . calcium carbonate-vitamin D3 (CALCIUM 600 + D,3,) 600 mg(1,546m) -200 unit tablet Take 1 tablet by mouth 2 (two) times daily with meals    . docusate (COLACE) 100 MG capsule Take 200 mg by mouth 2 (two) times daily as needed       . famotidine  (PEPCID) 40 MG tablet Take 1 tablet (40 mg total) by mouth nightly 90 tablet 3  . lisinopriL (ZESTRIL) 5 MG tablet Take 1 tablet (5 mg total) by mouth once daily 90 tablet 3  . metoprolol tartrate (LOPRESSOR) 25 MG tablet Take 0.5 tablets (12.5 mg total) by mouth 2 (two) times daily 90 tablet 3  . multivit-mins no.63/iron/folic (M-VIT ORAL) Take 2 tablets by mouth    . simvastatin (ZOCOR) 10 MG tablet Take 1 tablet (10 mg total) by mouth nightly 90 tablet 3   No current facility-administered medications for this visit.           Family History  Problem Relation Age of Onset  . Hyperlipidemia (Elevated cholesterol) Other   . High blood pressure (Hypertension) Other   . Diabetes type II Other   . Stroke Other   . Kidney cancer Sister      Review of Systems  Constitutional: Negative for chills and fever.  Respiratory: Negative for cough.        Objective:   Physical Exam Constitutional:      Appearance: Normal appearance.  HENT:     Head: Normocephalic.  Eyes:     Extraocular Movements: Extraocular movements intact.     Pupils: Pupils are equal, round, and reactive to light.  Cardiovascular:     Rate and Rhythm: Normal rate and regular rhythm.     Pulses: Normal pulses.     Heart sounds: Normal heart sounds.  Pulmonary:     Effort: Pulmonary effort is normal.     Breath sounds: Normal breath sounds.  Abdominal:     General: Abdomen is flat. Bowel sounds are normal.     Palpations: Abdomen is soft.     Tenderness: There is no abdominal tenderness.     Hernia: There is no hernia in the ventral area.    Musculoskeletal:     Cervical back: Normal range of motion and neck supple.  Lymphadenopathy:     Upper Body:     Right upper body: No supraclavicular or axillary adenopathy.     Left upper body: No supraclavicular or axillary adenopathy.     Lower Body: No right inguinal adenopathy. No left inguinal adenopathy.  Skin:    General: Skin is warm and  dry.  Neurological:     Mental Status: He is alert and oriented to person, place, and time.  Psychiatric:        Mood and Affect: Mood normal.  Behavior: Behavior normal.        Thought Content: Thought content normal.        Judgment: Judgment normal.    Labs and Radiology:  June 25, 2019 CT of the abdomen and pelvis.  MPRESSION: 1. Acute nondisplaced mildly comminuted fracture of the right inferior pubic ramus. 2. Subtle nondisplaced fracture of the superior aspect of the left pubic body. 3. A 3.5 x 3.1 cm soft tissue mass right anterolateral to the SVC within the root of the mesentery. Mild mesenteric lymphadenopathy. Differential considerations include malignancy such as metastatic disease or lymphoma. Recommend oncology consultation. 4. Aortic Atherosclerosis (ICD10-I70.0).   PET/CT dated July 03, 2019: 1. Mesenteric adenopathy in the abdomen and pelvis is primarily Deauville 4 although there is a dominant right abdominal mesenteric mass measuring 3.8 by 3.3 cm which is Deauville 5. The pattern and distribution favors lymphoma. ABDOMEN/PELVIS: Dominant right eccentric mesenteric lymph node/mass measuring 3.3 by 3.8 cm on image 183/3, maximum SUV 8.0, Deauville 5. Smaller but hypermetabolic and prominent in number central mesenteric lymph nodes include a 1.3 cm in short axis mesenteric node on image 166/3 with maximum SUV of 4.2, Deauville 4. There is accentuated activity in small right gastric and celiac chain lymph nodes, and a portacaval node measuring 0.9 cm in short axis on image 155/3 has a maximum SUV of 3.7, Deauville 4. A right lower quadrant 1.9 by 2.7 cm soft tissue nodule in the small bowel mesentery on image 203/3 has maximum SUV of 5.0, Deauville 4. 2. Left thyroid lobe lesions are probably benign based on negative PET-CT characteristics, but current guidelines still call for thyroid ultrasound as they do not take into account negative  PET-CT evidence. 3. A borderline enlarged left upper paratracheal lymph node has Deauville 3 activity. 4. Other imaging findings of potential clinical significance: Aortic Atherosclerosis (ICD10-I70.0). Coronary atherosclerosis. Small left scrotal hydrocele. Healing pelvic fractures including the right sacral ala, right pubic rami, and left pubic bone.  CEA: Normal at 1.9.  PSA, less than 0.01.  CBC at the time of trauma showed a hemoglobin of 16.1 with a white blood cell count of 15,700.  Follow-up laboratory studies 2 days later showed a hemoglobin of 14.6 with a normal MCV and a white blood cell count of 10,800.  Normal platelet count.  Chromogranin A determined on July 27, 2019: Mildly elevated at 113.6 (less than 101.8 is normal).  CT-guided biopsy dated July 29, 2019: SURGICAL PATHOLOGY  CASE: [id number]  PATIENT: Cecilie Kicks  Surgical Pathology Report      Specimen Submitted:  A. Mesenteric mass   Clinical History: 69 year old male with history of prostate cancer, now  with hypermetabolic 3.5 cm mesenteric mass; Chromogranin A mildly  elevated, PSA is within normal range       DIAGNOSIS:  A. 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   Comment:  Sections of the biopsy material demonstrate fragmented and limited  lymphoid tissue.  Material was submitted to [facility] for flow cytometric analysis. Per report a CD10 positive  monoclonal kappa B cell population was detected. The specimen was  limited by low cellularity and low viability (see scanned report in  [facility]).  Based on the flow cytometric results follicular lymphoma is a diagnostic  consideration. An excisional biopsy is required for definitive  classification and grading.     Assessment:     Imaging  suggestive of lymphoma, asymptomatic.    Plan:     Unfortunately, the patient has  no readily available peripheral nodes for evaluation.  The indications for abdominal exploration for node biopsy was reviewed.  With findings of multiple nodes, although one dominant area in the base of the mesentery, there is no indication for lymphadenectomy.  The minimally elevated Chromogranin A level is noted, but the patient has no clinical symptoms.  Indications for exploration, risks including bleeding and infection as well as delayed return of GI function was discussed.  I anticipate the patient will be able to be discharged home on postoperative day 1, but this will be dependent on his ability to tolerate a diet and good pain control.  To expedite his care, arrangements have been made for surgery on August 14, 2019.  Over an hour had been spent on preparation for the patient visit, clarification of biopsy requirements with the referring oncologist post visit and patient education.    Patient to follow up as scheduled and is aware to call for any new issues or concerns.  Entered by Ledell Noss, CMA, acting as a scribe for Dr. Hervey Ard, MD.   The documentation recorded by the scribe accurately reflects the service I personally performed and the decisions made by me.   Robert Bellow, MD FACS

## 2019-08-22 ENCOUNTER — Telehealth: Payer: Self-pay | Admitting: Internal Medicine

## 2019-08-22 LAB — SURGICAL PATHOLOGY

## 2019-08-22 NOTE — Telephone Encounter (Signed)
Spoke to patient's wife regarding the preliminary results of biopsy-suggestive of follicle lymphoma. Awaiting final pathology report/opinion at [facility].  Interested in follow-up to discuss treatment options after the availability of final pathology report. Will make appointment later.  FYI

## 2019-08-25 ENCOUNTER — Telehealth: Payer: Self-pay | Admitting: Internal Medicine

## 2019-08-25 NOTE — Telephone Encounter (Signed)
Patrick Pena - please schedule patient for labs

## 2019-08-25 NOTE — Telephone Encounter (Signed)
On 8/19-left voicemail for the patient's wife-given availability of final pathology results recommend follow-up to discuss the treatment plan.  C-please schedule appointment-this week-MD; no labs.

## 2019-08-27 ENCOUNTER — Inpatient Hospital Stay (HOSPITAL_BASED_OUTPATIENT_CLINIC_OR_DEPARTMENT_OTHER): Payer: Medicare PPO | Admitting: Internal Medicine

## 2019-08-27 ENCOUNTER — Other Ambulatory Visit: Payer: Self-pay

## 2019-08-27 VITALS — BP 121/47 | HR 53 | Temp 97.4°F | Resp 16 | Ht 69.0 in | Wt 180.0 lb

## 2019-08-27 DIAGNOSIS — C8583 Other specified types of non-Hodgkin lymphoma, intra-abdominal lymph nodes: Secondary | ICD-10-CM

## 2019-08-27 DIAGNOSIS — E041 Nontoxic single thyroid nodule: Secondary | ICD-10-CM | POA: Diagnosis not present

## 2019-08-27 DIAGNOSIS — Z833 Family history of diabetes mellitus: Secondary | ICD-10-CM | POA: Diagnosis not present

## 2019-08-27 DIAGNOSIS — Z7982 Long term (current) use of aspirin: Secondary | ICD-10-CM | POA: Diagnosis not present

## 2019-08-27 DIAGNOSIS — C8513 Unspecified B-cell lymphoma, intra-abdominal lymph nodes: Secondary | ICD-10-CM | POA: Diagnosis not present

## 2019-08-27 DIAGNOSIS — Z8051 Family history of malignant neoplasm of kidney: Secondary | ICD-10-CM | POA: Diagnosis not present

## 2019-08-27 DIAGNOSIS — Z9079 Acquired absence of other genital organ(s): Secondary | ICD-10-CM | POA: Diagnosis not present

## 2019-08-27 DIAGNOSIS — Z8546 Personal history of malignant neoplasm of prostate: Secondary | ICD-10-CM | POA: Diagnosis not present

## 2019-08-27 DIAGNOSIS — Z79899 Other long term (current) drug therapy: Secondary | ICD-10-CM | POA: Diagnosis not present

## 2019-08-27 DIAGNOSIS — K219 Gastro-esophageal reflux disease without esophagitis: Secondary | ICD-10-CM | POA: Diagnosis not present

## 2019-08-27 DIAGNOSIS — E7800 Pure hypercholesterolemia, unspecified: Secondary | ICD-10-CM | POA: Diagnosis not present

## 2019-08-27 DIAGNOSIS — I10 Essential (primary) hypertension: Secondary | ICD-10-CM | POA: Diagnosis not present

## 2019-08-27 NOTE — Assessment & Plan Note (Addendum)
#  Mesenteric mass 3.5 cm-close to IVC-incidental; s/p excisional Biopsy- FOLLICULAR LYMPHOMA-grade 1- 2.  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  I discussed the stage-clinically-II vs III [if at all the mediastinal lymph node is positive; however this has not been worked up].  Again stage IV disease is not completely ruled out-as patient has not had bone marrow biopsy.  As bone marrow biopsy is not going to change the current management [see below]-reasonable to hold the bone marrow biopsy at this time.  #Discussed with the patient in general follicle lymphoma cannot be cured however lymphomas in general are indolent/and do not needed to be treated unless symptomatic.  Reviewed the symptoms of worsening lymphoma-night sweats weight loss unexplained extreme fatigue fevers.  The other common reason for treatment would be symptomatic localized lymphadenopathy-bulky lymphadenopathy/organ dysfunction etc.  #Prostate cancer-s/p RP [2017].  PSA normal-continue follow-up with urology.  #PET negative thyroid nodule left side-unlikely malignant; but recommend ultrasound thyroid as per recommendation from radiology.   # DISPOSITION: will call with results of Thyroid US # Thyroid US in 1 week # follow up in 6 months- MD; labs- cbc/cmp;LDH; CT chest/A/P prior-Dr.B  

## 2019-08-27 NOTE — Progress Notes (Signed)
[facility] CONSULT NOTE  Patient Care Team: Sharyne Peach, MD as PCP - General (Family Medicine) Clent Jacks, RN as Registered Nurse  CHIEF COMPLAINTS/PURPOSE OF CONSULTATION: mesenteric  #  Oncology History Overview Note  #Prostate cancer-s/p RP- Dr.Border; July 2020- non-detectable.  #June 2021-CT scan-3.5 cm mesenteric mass [incidental]; # July 30th 2021- CT-Biopsy- # DIAGNOSIS: A. 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  Ki-67-10 to 20% [[facility] -opinion]; FLIPI- 2 [age/LDH-?213]   DIAGNOSIS: Follicle lymphoma  STAGE: II/III        ;  GOALS: Control  CURRENT/MOST RECENT THERAPY : Surveillance    Malignant neoplasm of prostate ([place])  12/31/2014 Initial Diagnosis   Malignant neoplasm of prostate ([place])   Non-Hodgkin lymphoma of intra-abdominal lymph nodes ([place])  08/05/2019 Initial Diagnosis   Non-Hodgkin lymphoma of intra-abdominal lymph nodes ([place])      HISTORY OF PRESENTING ILLNESS:  Patrick Pena 70 y.o.  male incidental diagnosis abdominal lymph nodes is here to review the results of his excisional biopsy.  Patient has recovered well from his fall.  He is walking independently.  Not using any cane or walker.  No weight loss.  No night sweats.  Review of Systems  Constitutional: Negative for chills, diaphoresis, fever, malaise/fatigue and weight loss.  HENT: Negative for nosebleeds and sore throat.   Eyes: Negative for double vision.  Respiratory: Negative for cough, hemoptysis, sputum production, shortness of breath and wheezing.   Cardiovascular: Negative for chest pain, palpitations, orthopnea and leg swelling.  Gastrointestinal: Negative for abdominal pain, blood in stool, constipation, diarrhea, heartburn, melena, nausea and vomiting.  Genitourinary: Negative for dysuria, frequency and urgency.   Musculoskeletal: Positive for back pain and joint pain.  Skin: Negative.  Negative for itching and rash.  Neurological: Negative for dizziness, tingling, focal weakness, weakness and headaches.  Endo/Heme/Allergies: Does not bruise/bleed easily.  Psychiatric/Behavioral: Negative for depression. The patient is not nervous/anxious and does not have insomnia.      MEDICAL HISTORY:  Past Medical History:  Diagnosis Date  . Arthritis   . Blood pressure elevated 05/12/2014  . BP (high blood pressure) 03/13/2013  . 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 Fractured pelvis ([place]) 06/25/2019   from fall off of ladder  . GERD (gastroesophageal reflux disease)   . Hypercholesteremia   . Hypercholesterolemia 03/13/2013  . Hypertension   . Prostate cancer [facility])    prostate cancer     SURGICAL HISTORY: Past Surgical History:  Procedure Laterality Date  . LAPAROTOMY N/A 08/14/2019   Procedure: EXPLORATORY LAPAROTOMY EXCISION MESENTERIC MASS;  Surgeon: Robert Bellow, MD;  Location: [facility] ORS;  Service: General;  Laterality: N/A;  . LYMPHADENECTOMY Bilateral 02/04/2015   Procedure: BILATERAL PELVIC LYMPHADENECTOMY;  Surgeon: Raynelle Bring, MD;  Location: [facility] ORS;  Service: Urology;  Laterality: Bilateral;  . ROBOT ASSISTED LAPAROSCOPIC RADICAL PROSTATECTOMY N/A 02/04/2015   Procedure: XI ROBOTIC ASSISTED LAPAROSCOPIC RADICAL PROSTATECTOMY LEVEL 2;  Surgeon: Raynelle Bring, MD;  Location: [facility] ORS;  Service: Urology;  Laterality: N/A;    SOCIAL HISTORY: Social History   Socioeconomic History  . Marital status: Married    Spouse name: Not on file  . Number of children: Not on file  . Years of education: Not on file  . Highest education level: Not on file  Occupational History  . Not on file  Tobacco Use  . Smoking status: Never Smoker  . Smokeless tobacco: Never Used  Vaping Use  . Vaping Use: Never used  Substance and  Sexual Activity  . Alcohol use: Yes    Comment: 2-4 ounces almost every day   . Drug use: No  . Sexual activity: Not on file  Other Topics Concern  . Not on file  Social History Narrative   Lives in [place]; Dealer; never smoked; whiskey every day.    Social Determinants of Health   Financial Resource Strain:   . Difficulty of Paying Living Expenses: Not on file  Food Insecurity:   . Worried About Charity fundraiser in the Last Year: Not on file  . Ran Out of Food in the Last Year: Not on file  Transportation Needs:   . Lack of Transportation (Medical): Not on file  . Lack of Transportation (Non-Medical): Not on file  Physical Activity:   . Days of Exercise per Week: Not on file  . Minutes of Exercise per Session: Not on file  Stress:   . Feeling of Stress : Not on file  Social Connections:   . Frequency of Communication with Friends and Family: Not on file  . Frequency of Social Gatherings with Friends and Family: Not on file  . Attends Religious Services: Not on file  . Active Member of Clubs or Organizations: Not on file  . Attends Archivist Meetings: Not on file  . Marital Status: Not on file  Intimate Partner Violence:   . Fear of Current or Ex-Partner: Not on file  . Emotionally Abused: Not on file  . Physically Abused: Not on file  . Sexually Abused: Not on file    FAMILY HISTORY: Family History  Problem Relation Age of Onset  . Diabetes Mellitus II Other   . Hyperlipidemia Other   . Stroke Other   . Hypertension Other   . Kidney cancer Sister        survived  . Hypertension Father   . Prostate cancer Neg Hx   . Tuberculosis Neg Hx     ALLERGIES:  is allergic to atorvastatin.  MEDICATIONS:  Current Outpatient Medications  Medication Sig Dispense Refill  . acetaminophen (TYLENOL) 500 MG tablet Take 1,000 mg by mouth every 6 (six) hours as needed for moderate pain.     Marland Kitchen aspirin EC 81 MG tablet Take 81 mg by mouth daily. Swallow whole.     . Calcium Carb-Cholecalciferol (CALCIUM 600 + D PO) Take 1 tablet by mouth 2 (two) times daily.    . famotidine (PEPCID) 40 MG tablet Take 40 mg by mouth at bedtime.     Marland Kitchen HYDROcodone-acetaminophen (NORCO/VICODIN) 5-325 MG tablet Take 1 tablet by mouth every 4 (four) hours as needed for moderate pain. 20 tablet 0  . Liniments (DEEP BLUE RELIEF EX) Apply 1 application topically 2 (two) times daily as needed (muscle pain.).    Marland Kitchen lisinopril (PRINIVIL,ZESTRIL) 5 MG tablet TAKE 1 TABLET BY MOUTH DAILY (Patient taking differently: Take 5 mg by mouth daily. ) 30 tablet 0  . metoprolol tartrate (LOPRESSOR) 25 MG tablet TAKE 1/2 TABLET BY MOUTH TWICE A DAY (Patient taking differently: Take 12.5 mg by mouth 2 (two) times daily. ) 30 tablet 0  . Multiple Vitamin (MULTIVITAMIN) tablet Take 1 tablet by mouth daily.    . simvastatin (ZOCOR) 10 MG tablet Take 10 mg by mouth at bedtime.      No current facility-administered medications for  this visit.      Marland Kitchen  PHYSICAL EXAMINATION: ECOG PERFORMANCE STATUS: 0 - Asymptomatic  Vitals:   08/27/19 0909  BP: (!) 121/47  Pulse: (!) 53  Resp: 16  Temp: (!) 97.4 F (36.3 C)  SpO2: 98%   Filed Weights   08/27/19 0909  Weight: 180 lb (81.6 kg)    Physical Exam Constitutional:      Comments: Accompanied by his wife.  He is walking independently.  HENT:     Head: Normocephalic and atraumatic.     Mouth/Throat:     Pharynx: No oropharyngeal exudate.  Eyes:     Pupils: Pupils are equal, round, and reactive to light.  Cardiovascular:     Rate and Rhythm: Normal rate and regular rhythm.  Pulmonary:     Effort: Pulmonary effort is normal. No respiratory distress.     Breath sounds: Normal breath sounds. No wheezing.  Abdominal:     General: Bowel sounds are normal. There is no distension.     Palpations: Abdomen is soft. There is no mass.     Tenderness: There is no abdominal tenderness. There is no guarding or rebound.  Musculoskeletal:         General: No tenderness. Normal range of motion.     Cervical back: Normal range of motion and neck supple.  Skin:    General: Skin is warm.  Neurological:     Mental Status: He is alert and oriented to person, place, and time.  Psychiatric:        Mood and Affect: Affect normal.      LABORATORY DATA:  I have reviewed the data as listed Lab Results  Component Value Date   WBC 6.6 07/29/2019   HGB 16.3 07/29/2019   HCT 47.4 07/29/2019   MCV 91.5 07/29/2019   PLT 241 07/29/2019   Recent Labs    06/25/19 1629  NA 136  K 3.8  CL 100  CO2 25  GLUCOSE 109*  BUN 21  CREATININE 1.06  CALCIUM 9.4  GFRNONAA >60  GFRAA >60  PROT 7.3  ALBUMIN 4.4  AST 36  ALT 26  ALKPHOS 63  BILITOT 0.9    RADIOGRAPHIC STUDIES: I have personally reviewed the radiological images as listed and agreed with the findings in the report. No results found.  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  I discussed the stage-clinically-II vs III [if at all the mediastinal lymph node is positive; however this has not been worked up].  Again stage IV disease is not completely ruled out-as patient has not had bone marrow biopsy.  As bone marrow biopsy is not going to change the current management [see below]-reasonable to hold the bone marrow biopsy at this time.  #Discussed with the patient in general follicle lymphoma cannot be cured however lymphomas in general are indolent/and do not needed to be treated unless symptomatic.  Reviewed the symptoms of worsening lymphoma-night sweats weight loss unexplained extreme fatigue fevers.  The other common reason for treatment would be symptomatic localized lymphadenopathy-bulky lymphadenopathy/organ dysfunction etc.  #Prostate cancer-s/p RP [2017].  PSA normal-continue follow-up with urology.  #PET  negative thyroid nodule left side-unlikely malignant; but recommend ultrasound thyroid as per recommendation from radiology.   # DISPOSITION: will call with results of Thyroid US # Thyroid US in 1 week # follow up in 6 months-  MD; labs- cbc/cmp;LDH; CT chest/A/P prior-Dr.B   All questions were answered. The patient knows to call the clinic with any problems, questions or concerns.    Patrick Sickle, MD 08/29/2019 8:01 AM

## 2019-09-04 ENCOUNTER — Ambulatory Visit
Admission: RE | Admit: 2019-09-04 | Discharge: 2019-09-04 | Disposition: A | Discharge status: Home / Self Care | Payer: Medicare PPO | Source: Ambulatory Visit | Attending: Internal Medicine | Admitting: Internal Medicine

## 2019-09-04 ENCOUNTER — Other Ambulatory Visit: Payer: Self-pay

## 2019-09-04 DIAGNOSIS — E041 Nontoxic single thyroid nodule: Secondary | ICD-10-CM

## 2019-09-10 ENCOUNTER — Telehealth: Payer: Self-pay | Admitting: Internal Medicine

## 2019-09-10 NOTE — Telephone Encounter (Signed)
Spoke to patient's wife Patrick Pena regarding the recent ultrasound-multinodular goiter/left nodule-recommend biopsy.   Patient previously seen Dr. Ladene Artist ENT/ [place].  Recommend reaching out to [facility] for appt; Pt's wife in agreement. She will call us for any new referral.  GB

## 2019-09-16 ENCOUNTER — Telehealth: Payer: Self-pay

## 2019-09-16 NOTE — Telephone Encounter (Signed)
Per pts request, faxed over recent thyroid US results to Dr. Margaretha Sheffield.

## 2019-09-24 ENCOUNTER — Other Ambulatory Visit: Payer: Self-pay | Admitting: Otolaryngology

## 2019-09-24 DIAGNOSIS — E041 Nontoxic single thyroid nodule: Secondary | ICD-10-CM

## 2019-09-30 ENCOUNTER — Ambulatory Visit
Admission: RE | Admit: 2019-09-30 | Discharge: 2019-09-30 | Disposition: A | Discharge status: Home / Self Care | Payer: Medicare PPO | Source: Ambulatory Visit | Attending: Otolaryngology | Admitting: Otolaryngology

## 2019-09-30 ENCOUNTER — Other Ambulatory Visit: Payer: Self-pay

## 2019-09-30 DIAGNOSIS — Z8572 Personal history of non-Hodgkin lymphomas: Secondary | ICD-10-CM | POA: Insufficient documentation

## 2019-09-30 DIAGNOSIS — E041 Nontoxic single thyroid nodule: Secondary | ICD-10-CM | POA: Diagnosis present

## 2019-09-30 DIAGNOSIS — Z8546 Personal history of malignant neoplasm of prostate: Secondary | ICD-10-CM | POA: Insufficient documentation

## 2019-09-30 DIAGNOSIS — D34 Benign neoplasm of thyroid gland: Secondary | ICD-10-CM | POA: Insufficient documentation

## 2019-09-30 NOTE — Discharge Instructions (Signed)
Thyroid Needle Biopsy, Care After This sheet gives you information about how to care for yourself after your procedure. Your health care provider may also give you more specific instructions. If you have problems or questions, contact your health care provider. What can I expect after the procedure? After the procedure, it is common to have:  Soreness and tenderness that lasts for a few days.  Bruising where the needle was inserted (puncture site). Follow these instructions at home:   Take over-the-counter and prescription medicines only as told by your health care provider.  To help ease discomfort, keep your head raised (elevated) when you are lying down. When you move from lying down to sitting up, use both hands to support the back of your head and neck.  Check your puncture site every day for signs of infection. Check for: ? Redness, swelling, or pain. ? Fluid or blood. ? Warmth. ? Pus or a bad smell.  Return to your normal activities as told by your health care provider. Ask your health care provider what activities are safe for you.  Keep all follow-up visits as told by your health care provider. This is important. Contact a health care provider if:  You have redness, swelling, or pain around your puncture site.  You have fluid or blood coming from your puncture site.  Your puncture site feels warm to the touch.  You have pus or a bad smell coming from your puncture site.  You have a fever. Get help right away if:  You have severe bleeding from the puncture site.  You have difficulty swallowing.  You have swollen glands (lymph nodes) in your neck. Summary  It is common to have some bruising and soreness where the needle was inserted in your lower front neck area (puncture site).  Check your puncture site every day for signs of infection, such as redness, swelling, or pain.  Get help right away if you have severe bleeding from your puncture site. This  information is not intended to replace advice given to you by your health care provider. Make sure you discuss any questions you have with your health care provider. Document Revised: 12/01/2016 Document Reviewed: 10/02/2016 Elsevier Patient Education  2020 Else[employer] Inc.  

## 2019-09-30 NOTE — Procedures (Signed)
US guided FNA X 7 via 25 gauge needles of left superior thyroid nodule performed. Path pending. No immediate complications. EBL< 1 cc.

## 2019-10-01 LAB — CYTOLOGY - NON PAP

## 2020-02-19 ENCOUNTER — Other Ambulatory Visit: Payer: Self-pay

## 2020-02-19 ENCOUNTER — Ambulatory Visit
Admission: RE | Admit: 2020-02-19 | Discharge: 2020-02-19 | Disposition: A | Discharge status: Home / Self Care | Payer: Medicare PPO | Source: Ambulatory Visit | Attending: Internal Medicine | Admitting: Internal Medicine

## 2020-02-19 DIAGNOSIS — C8583 Other specified types of non-Hodgkin lymphoma, intra-abdominal lymph nodes: Secondary | ICD-10-CM | POA: Diagnosis present

## 2020-02-19 LAB — POCT I-STAT CREATININE: Creatinine, Ser: 0.9 mg/dL (ref 0.61–1.24)

## 2020-02-19 MED ORDER — IOHEXOL 300 MG/ML  SOLN
100.0000 mL | Freq: Once | INTRAMUSCULAR | Status: AC | PRN
  Administered 2020-02-19: 100 mL via INTRAVENOUS

## 2020-02-25 ENCOUNTER — Encounter: Payer: Self-pay | Admitting: Internal Medicine

## 2020-02-25 ENCOUNTER — Inpatient Hospital Stay: Payer: Medicare PPO | Admitting: Internal Medicine

## 2020-02-25 ENCOUNTER — Inpatient Hospital Stay: Payer: Medicare PPO | Attending: Internal Medicine

## 2020-02-25 VITALS — BP 124/64 | HR 51 | Temp 96.8°F | Resp 16 | Ht 64.0 in | Wt 183.0 lb

## 2020-02-25 DIAGNOSIS — C8513 Unspecified B-cell lymphoma, intra-abdominal lymph nodes: Secondary | ICD-10-CM | POA: Insufficient documentation

## 2020-02-25 DIAGNOSIS — Z833 Family history of diabetes mellitus: Secondary | ICD-10-CM | POA: Insufficient documentation

## 2020-02-25 DIAGNOSIS — E7801 Familial hypercholesterolemia: Secondary | ICD-10-CM | POA: Insufficient documentation

## 2020-02-25 DIAGNOSIS — Z7982 Long term (current) use of aspirin: Secondary | ICD-10-CM | POA: Diagnosis not present

## 2020-02-25 DIAGNOSIS — I10 Essential (primary) hypertension: Secondary | ICD-10-CM | POA: Insufficient documentation

## 2020-02-25 DIAGNOSIS — E041 Nontoxic single thyroid nodule: Secondary | ICD-10-CM | POA: Diagnosis not present

## 2020-02-25 DIAGNOSIS — Z8349 Family history of other endocrine, nutritional and metabolic diseases: Secondary | ICD-10-CM | POA: Insufficient documentation

## 2020-02-25 DIAGNOSIS — Z79899 Other long term (current) drug therapy: Secondary | ICD-10-CM | POA: Diagnosis not present

## 2020-02-25 DIAGNOSIS — C8583 Other specified types of non-Hodgkin lymphoma, intra-abdominal lymph nodes: Secondary | ICD-10-CM

## 2020-02-25 DIAGNOSIS — Z8249 Family history of ischemic heart disease and other diseases of the circulatory system: Secondary | ICD-10-CM | POA: Insufficient documentation

## 2020-02-25 DIAGNOSIS — Z9079 Acquired absence of other genital organ(s): Secondary | ICD-10-CM | POA: Diagnosis not present

## 2020-02-25 DIAGNOSIS — C61 Malignant neoplasm of prostate: Secondary | ICD-10-CM | POA: Insufficient documentation

## 2020-02-25 DIAGNOSIS — K219 Gastro-esophageal reflux disease without esophagitis: Secondary | ICD-10-CM | POA: Insufficient documentation

## 2020-02-25 LAB — CBC WITH DIFFERENTIAL/PLATELET
Abs Immature Granulocytes: 0.02 10*3/uL (ref 0.00–0.07)
Basophils Absolute: 0.1 10*3/uL (ref 0.0–0.1)
Basophils Relative: 1 %
Eosinophils Absolute: 0.5 10*3/uL (ref 0.0–0.5)
Eosinophils Relative: 7 %
HCT: 47.9 % (ref 39.0–52.0)
Hemoglobin: 16.2 g/dL (ref 13.0–17.0)
Immature Granulocytes: 0 %
Lymphocytes Relative: 30 %
Lymphs Abs: 2.2 10*3/uL (ref 0.7–4.0)
MCH: 31.9 pg (ref 26.0–34.0)
MCHC: 33.8 g/dL (ref 30.0–36.0)
MCV: 94.3 fL (ref 80.0–100.0)
Monocytes Absolute: 0.9 10*3/uL (ref 0.1–1.0)
Monocytes Relative: 13 %
Neutro Abs: 3.5 10*3/uL (ref 1.7–7.7)
Neutrophils Relative %: 49 %
Platelets: 279 10*3/uL (ref 150–400)
RBC: 5.08 MIL/uL (ref 4.22–5.81)
RDW: 12.7 % (ref 11.5–15.5)
WBC: 7.2 10*3/uL (ref 4.0–10.5)
nRBC: 0 % (ref 0.0–0.2)

## 2020-02-25 LAB — COMPREHENSIVE METABOLIC PANEL
ALT: 23 U/L (ref 0–44)
AST: 26 U/L (ref 15–41)
Albumin: 4.2 g/dL (ref 3.5–5.0)
Alkaline Phosphatase: 57 U/L (ref 38–126)
Anion gap: 10 (ref 5–15)
BUN: 19 mg/dL (ref 8–23)
CO2: 27 mmol/L (ref 22–32)
Calcium: 9.1 mg/dL (ref 8.9–10.3)
Chloride: 99 mmol/L (ref 98–111)
Creatinine, Ser: 1.01 mg/dL (ref 0.61–1.24)
GFR, Estimated: >60 mL/min (ref >60)
Glucose, Bld: 115 mg/dL — ABNORMAL HIGH (ref 70–99)
Potassium: 3.9 mmol/L (ref 3.5–5.1)
Sodium: 136 mmol/L (ref 135–145)
Total Bilirubin: 0.8 mg/dL (ref 0.3–1.2)
Total Protein: 7 g/dL (ref 6.5–8.1)

## 2020-02-25 LAB — LACTATE DEHYDROGENASE: LDH: 155 U/L (ref 98–192)

## 2020-02-25 NOTE — Assessment & Plan Note (Addendum)
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  S/p mesenteric lymph node biopsy.  February 2022 CT scan-stable approximately 4 cm mesenteric mass.  No new progressive disease.  Patient is asymptomatic.  Continue surveillance every 6 months.  Imaging on annual basis.  Again reminded the patient of potential symptoms of progressive lymphoma.  He will call us if symptoms occur.  #Prostate cancer-s/p RP [2633].  PSA normal-continue follow-up with urology.  #PET negative thyroid nodule left side s/p Bx- benign nodule [Dr.Jeungle]  # DISPOSITION:  # follow up in 6 months- MD; labs- cbc/cmp;LDH-Dr.B  # I reviewed the blood work- with the patient in detail; also reviewed the imaging independently [as summarized above]; and with the patient in detail.

## 2020-02-25 NOTE — Progress Notes (Signed)
[facility] NOTE  Patient Care Team: Sharyne Peach, MD as PCP - General (Family Medicine) Margaretha Sheffield, MD as Consulting Physician (Otolaryngology) Cammie Sickle, MD as Consulting Physician (Internal Medicine)  CHIEF COMPLAINTS/PURPOSE OF CONSULTATION: mesenteric  #  Oncology History Overview Note  #Prostate cancer-s/p RP- Dr.Border; July 2020- non-detectable.  #June 2021-CT scan-3.5 cm mesenteric mass [incidental]; # July 30th 2021- CT-Biopsy- # DIAGNOSIS: A. 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  male follicle lymphoma grade 1-2-currently under surveillance is here for follow-up/review results of the CT scan.  The interim patient underwent biopsy of his thyroid nodule with ENT.  Patient denies any worsening night sweats or fatigue or shortness of breath or cough.  Denies any new lumps or bumps.   Review of Systems  Constitutional: Negative for chills, diaphoresis, fever, malaise/fatigue and weight loss.  HENT: Negative for nosebleeds and sore throat.   Eyes: Negative for double vision.  Respiratory: Negative for cough, hemoptysis, sputum production, shortness of breath and wheezing.   Cardiovascular: Negative for chest pain, palpitations, orthopnea and leg swelling.  Gastrointestinal: Negative for abdominal pain, blood  in stool, constipation, diarrhea, heartburn, melena, nausea and vomiting.  Genitourinary: Negative for dysuria, frequency and urgency.  Musculoskeletal: Positive for back pain and joint pain.  Skin: Negative.  Negative for itching and rash.  Neurological: Negative for dizziness, tingling, focal weakness, weakness and headaches.  Endo/Heme/Allergies: Does not bruise/bleed easily.  Psychiatric/Behavioral: Negative for depression. The patient is not nervous/anxious and does not have insomnia.      MEDICAL HISTORY:  Past Medical History:  Diagnosis Date  . Arthritis   . Blood pressure elevated 05/12/2014  . BP (high blood pressure) 03/13/2013  . 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 Fractured pelvis ([place]) 06/25/2019   from fall off of ladder  . GERD (gastroesophageal reflux disease)   . Hypercholesteremia   . Hypercholesterolemia 03/13/2013  . Hypertension   . Prostate cancer [facility])    prostate cancer     SURGICAL HISTORY: Past Surgical History:  Procedure Laterality Date  . LAPAROTOMY N/A 08/14/2019   Procedure: EXPLORATORY LAPAROTOMY EXCISION MESENTERIC MASS;  Surgeon: Robert Bellow, MD;  Location: [facility] ORS;  Service: General;  Laterality: N/A;  . LYMPHADENECTOMY Bilateral 02/04/2015   Procedure: BILATERAL PELVIC LYMPHADENECTOMY;  Surgeon: Raynelle Bring, MD;  Location: [facility] ORS;  Service: Urology;  Laterality: Bilateral;  . ROBOT ASSISTED LAPAROSCOPIC RADICAL PROSTATECTOMY N/A 02/04/2015   Procedure: XI ROBOTIC ASSISTED LAPAROSCOPIC RADICAL PROSTATECTOMY LEVEL 2;  Surgeon: Raynelle Bring, MD;  Location: [facility] ORS;  Service: Urology;  Laterality: N/A;    SOCIAL HISTORY: Social History   Socioeconomic History  . Marital status: Married    Spouse name: Not on file  . Number of children: Not on file  . Years of education: Not on file  .  Highest education level: Not on file  Occupational History  . Not on file   Tobacco Use  . Smoking status: Never Smoker  . Smokeless tobacco: Never Used  Vaping Use  . Vaping Use: Never used  Substance and Sexual Activity  . Alcohol use: Yes    Comment: 2-4 ounces almost every day   . Drug use: No  . Sexual activity: Not on file  Other Topics Concern  . Not on file  Social History Narrative   Lives in [place]; Dealer; never smoked; whiskey every day.    Social Determinants of Health   Financial Resource Strain: Not on file  Food Insecurity: Not on file  Transportation Needs: Not on file  Physical Activity: Not on file  Stress: Not on file  Social Connections: Not on file  Intimate Partner Violence: Not on file    FAMILY HISTORY: Family History  Problem Relation Age of Onset  . Diabetes Mellitus II Other   . Hyperlipidemia Other   . Stroke Other   . Hypertension Other   . Kidney cancer Sister        survived  . Hypertension Father   . Prostate cancer Neg Hx   . Tuberculosis Neg Hx     ALLERGIES:  is allergic to atorvastatin.  MEDICATIONS:  Current Outpatient Medications  Medication Sig Dispense Refill  . acetaminophen (TYLENOL) 500 MG tablet Take 1,000 mg by mouth every 6 (six) hours as needed for moderate pain.     Marland Kitchen aspirin EC 81 MG tablet Take 81 mg by mouth daily. Swallow whole.    . Calcium Carb-Cholecalciferol (CALCIUM 600 + D PO) Take 1 tablet by mouth 2 (two) times daily.    . famotidine (PEPCID) 40 MG tablet Take 40 mg by mouth at bedtime.     . Liniments (DEEP BLUE RELIEF EX) Apply 1 application topically 2 (two) times daily as needed (muscle pain.).    Marland Kitchen lisinopril (PRINIVIL,ZESTRIL) 5 MG tablet TAKE 1 TABLET BY MOUTH DAILY (Patient taking differently: Take 5 mg by mouth daily.) 30 tablet 0  . metoprolol tartrate (LOPRESSOR) 25 MG tablet TAKE 1/2 TABLET BY MOUTH TWICE A DAY (Patient taking differently: Take 12.5 mg by mouth 2 (two) times daily.) 30 tablet 0  . Multiple Vitamin (MULTIVITAMIN) tablet Take 1 tablet by mouth  daily.    . simvastatin (ZOCOR) 10 MG tablet Take 10 mg by mouth at bedtime.      No current facility-administered medications for this visit.      Marland Kitchen  PHYSICAL EXAMINATION: ECOG PERFORMANCE STATUS: 0 - Asymptomatic  Vitals:   02/25/20 1329  BP: 124/64  Pulse: (!) 51  Resp: 16  Temp: (!) 96.8 F (36 C)  SpO2: 99%   Filed Weights   02/25/20 1329  Weight: 183 lb (83 kg)    Physical Exam Constitutional:      Comments: Accompanied by his wife.  He is walking independently.  HENT:     Head: Normocephalic and atraumatic.     Mouth/Throat:     Pharynx: No oropharyngeal exudate.  Eyes:     Pupils: Pupils are equal, round, and reactive to light.  Cardiovascular:     Rate and Rhythm: Normal rate and regular rhythm.  Pulmonary:     Effort: Pulmonary effort is normal. No respiratory distress.     Breath sounds: Normal breath sounds. No wheezing.  Abdominal:     General: Bowel sounds are normal. There is no distension.  Palpations: Abdomen is soft. There is no mass.     Tenderness: There is no abdominal tenderness. There is no guarding or rebound.  Musculoskeletal:        General: No tenderness. Normal range of motion.     Cervical back: Normal range of motion and neck supple.  Skin:    General: Skin is warm.  Neurological:     Mental Status: He is alert and oriented to person, place, and time.  Psychiatric:        Mood and Affect: Affect normal.      LABORATORY DATA:  I have reviewed the data as listed Lab Results  Component Value Date   WBC 7.2 02/25/2020   HGB 16.2 02/25/2020   HCT 47.9 02/25/2020   MCV 94.3 02/25/2020   PLT 279 02/25/2020   Recent Labs    06/25/19 1629 02/19/20 0937 02/25/20 1313  NA 136  --  136  K 3.8  --  3.9  CL 100  --  99  CO2 25  --  27  GLUCOSE 109*  --  115*  BUN 21  --  19  CREATININE 1.06 0.90 1.01  CALCIUM 9.4  --  9.1  GFRNONAA >60  --  >60  GFRAA >60  --   --   PROT 7.3  --  7.0  ALBUMIN 4.4  --  4.2  AST 36   --  26  ALT 26  --  23  ALKPHOS 63  --  57  BILITOT 0.9  --  0.8    RADIOGRAPHIC STUDIES: I have personally reviewed the radiological images as listed and agreed with the findings in the report. CT Chest W Contrast  Result Date: 02/19/2020 CLINICAL DATA:  Non-Hodgkin's lymphoma. Assess response to treatment. Follicular lymphoma. EXAM: CT CHEST, ABDOMEN, AND PELVIS WITH CONTRAST TECHNIQUE: Multidetector CT imaging of the chest, abdomen and pelvis was performed following the standard protocol during bolus administration of intravenous contrast. CONTRAST:  127m OMNIPAQUE IOHEXOL 300 MG/ML  SOLN COMPARISON:  PET-CT 07/03/2019 FINDINGS: CT CHEST FINDINGS Cardiovascular: No significant vascular findings. Normal heart size. No pericardial effusion. Mediastinum/Nodes: No axillary or supraclavicular adenopathy. Stable enlargement of a LEFT thyroid nodule. No mediastinal lymphadenopathy. Esophagus normal. Lungs/Pleura: No new or suspicious pulmonary nodules. Airways normal Musculoskeletal: No aggressive osseous lesion. CT ABDOMEN AND PELVIS FINDINGS Hepatobiliary: No focal hepatic lesion. No biliary ductal dilatation. Gallbladder is normal. Common bile duct is normal. Pancreas: Pancreas is normal. No ductal dilatation. No pancreatic inflammation. Spleen: Normal spleen Adrenals/urinary tract: Adrenal glands and kidneys are normal. The ureters and bladder normal. Stomach/Bowel: Stomach, small bowel, appendix, and cecum are normal. The colon and rectosigmoid colon are normal. Vascular/Lymphatic: Abdominal aorta normal caliber. No retroperitoneal lymphadenopathy. Enlarged lymph nodes within the central mesentery again noted. Dominant node in the RIGHT central abdomen measures 34 x 30 mm (image 81/2) compared to 31 x 35 mm on PET-CT exam. Smaller mesenteric nodes along the SMA also unchanged. For example 11 mm short axis node on image 73/2 compares to 7 mm. 9 mm node on image 83/2 compares to 10 mm. No new adenopathy in  the mesentery No inguinal or iliac adenopathy. Reproductive: Prostate small.  Bilateral hydroceles. Other: No free fluid Musculoskeletal: Remote RIGHT inferior pubic ramus fracture. Aggressive osseous lesion. IMPRESSION: Chest Impression: 1. No lymphadenopathy in the thorax. 2. No pulmonary lymphoma. 3. Stable LEFT thyroid nodule. Abdomen / Pelvis Impression: 1. Stable mesenteric lymphadenopathy. Dominant node in the RIGHT abdomen unchanged. Smaller nodes  also unchanged. No progression or significant improvement. 2. No new adenopathy in the abdomen pelvis. 3. Normal volume spleen. 4. No skeletal metastasis identified Electronically Signed   By: Suzy Bouchard M.D.   On: 02/19/2020 12:54   CT Abdomen Pelvis W Contrast  Result Date: 02/19/2020 CLINICAL DATA:  Non-Hodgkin's lymphoma. Assess response to treatment. Follicular lymphoma. EXAM: CT CHEST, ABDOMEN, AND PELVIS WITH CONTRAST TECHNIQUE: Multidetector CT imaging of the chest, abdomen and pelvis was performed following the standard protocol during bolus administration of intravenous contrast. CONTRAST:  147m OMNIPAQUE IOHEXOL 300 MG/ML  SOLN COMPARISON:  PET-CT 07/03/2019 FINDINGS: CT CHEST FINDINGS Cardiovascular: No significant vascular findings. Normal heart size. No pericardial effusion. Mediastinum/Nodes: No axillary or supraclavicular adenopathy. Stable enlargement of a LEFT thyroid nodule. No mediastinal lymphadenopathy. Esophagus normal. Lungs/Pleura: No new or suspicious pulmonary nodules. Airways normal Musculoskeletal: No aggressive osseous lesion. CT ABDOMEN AND PELVIS FINDINGS Hepatobiliary: No focal hepatic lesion. No biliary ductal dilatation. Gallbladder is normal. Common bile duct is normal. Pancreas: Pancreas is normal. No ductal dilatation. No pancreatic inflammation. Spleen: Normal spleen Adrenals/urinary tract: Adrenal glands and kidneys are normal. The ureters and bladder normal. Stomach/Bowel: Stomach, small bowel, appendix, and cecum  are normal. The colon and rectosigmoid colon are normal. Vascular/Lymphatic: Abdominal aorta normal caliber. No retroperitoneal lymphadenopathy. Enlarged lymph nodes within the central mesentery again noted. Dominant node in the RIGHT central abdomen measures 34 x 30 mm (image 81/2) compared to 31 x 35 mm on PET-CT exam. Smaller mesenteric nodes along the SMA also unchanged. For example 11 mm short axis node on image 73/2 compares to 7 mm. 9 mm node on image 83/2 compares to 10 mm. No new adenopathy in the mesentery No inguinal or iliac adenopathy. Reproductive: Prostate small.  Bilateral hydroceles. Other: No free fluid Musculoskeletal: Remote RIGHT inferior pubic ramus fracture. Aggressive osseous lesion. IMPRESSION: Chest Impression: 1. No lymphadenopathy in the thorax. 2. No pulmonary lymphoma. 3. Stable LEFT thyroid nodule. Abdomen / Pelvis Impression: 1. Stable mesenteric lymphadenopathy. Dominant node in the RIGHT abdomen unchanged. Smaller nodes also unchanged. No progression or significant improvement. 2. No new adenopathy in the abdomen pelvis. 3. Normal volume spleen. 4. No skeletal metastasis identified Electronically Signed   By: SSuzy BouchardM.D.   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  S/p mesenteric lymph node biopsy.  February 2022 CT scan-stable approximately 4 cm mesenteric mass.  No new progressive disease.  Patient is asymptomatic.  Continue surveillance every 6 months.  Imaging on annual basis.  Again reminded the patient of potential symptoms of progressive lymphoma.  He will [place] symptoms occur.  #Prostate cancer-s/p RP [[1638]  PSA normal-continue follow-up with urology.  #PET negative thyroid nodule left side s/p Bx- benign nodule [Dr.Jeungle]  # DISPOSITION:  # follow up in 6 months- MD; labs- cbc/cmp;LDH-Dr.B  # I reviewed the blood work- with the  patient in detail; also reviewed the imaging independently [as summarized above]; and with the patient in detail.     All questions were answered. The patient knows to call the clinic with any problems, questions or concerns.    GCammie Sickle MD 02/25/2020 2:05 PM

## 2020-08-24 ENCOUNTER — Inpatient Hospital Stay: Payer: Medicare PPO | Attending: Internal Medicine

## 2020-08-24 ENCOUNTER — Inpatient Hospital Stay (HOSPITAL_BASED_OUTPATIENT_CLINIC_OR_DEPARTMENT_OTHER): Payer: Medicare PPO | Admitting: Internal Medicine

## 2020-08-24 ENCOUNTER — Other Ambulatory Visit: Payer: Self-pay

## 2020-08-24 ENCOUNTER — Encounter: Payer: Self-pay | Admitting: Internal Medicine

## 2020-08-24 VITALS — BP 137/65 | HR 52 | Temp 97.3°F | Resp 20 | Ht 64.0 in | Wt 182.3 lb

## 2020-08-24 DIAGNOSIS — C8583 Other specified types of non-Hodgkin lymphoma, intra-abdominal lymph nodes: Secondary | ICD-10-CM | POA: Diagnosis not present

## 2020-08-24 DIAGNOSIS — Z125 Encounter for screening for malignant neoplasm of prostate: Secondary | ICD-10-CM

## 2020-08-24 DIAGNOSIS — I10 Essential (primary) hypertension: Secondary | ICD-10-CM | POA: Insufficient documentation

## 2020-08-24 DIAGNOSIS — C8210 Follicular lymphoma grade II, unspecified site: Secondary | ICD-10-CM | POA: Diagnosis not present

## 2020-08-24 DIAGNOSIS — C61 Malignant neoplasm of prostate: Secondary | ICD-10-CM | POA: Diagnosis not present

## 2020-08-24 DIAGNOSIS — Z8051 Family history of malignant neoplasm of kidney: Secondary | ICD-10-CM | POA: Diagnosis not present

## 2020-08-24 DIAGNOSIS — C8593 Non-Hodgkin lymphoma, unspecified, intra-abdominal lymph nodes: Secondary | ICD-10-CM | POA: Insufficient documentation

## 2020-08-24 LAB — COMPREHENSIVE METABOLIC PANEL
ALT: 21 U/L (ref 0–44)
AST: 28 U/L (ref 15–41)
Albumin: 4.1 g/dL (ref 3.5–5.0)
Alkaline Phosphatase: 61 U/L (ref 38–126)
Anion gap: 8 (ref 5–15)
BUN: 17 mg/dL (ref 8–23)
CO2: 28 mmol/L (ref 22–32)
Calcium: 8.9 mg/dL (ref 8.9–10.3)
Chloride: 101 mmol/L (ref 98–111)
Creatinine, Ser: 1.1 mg/dL (ref 0.61–1.24)
GFR, Estimated: >60 mL/min (ref >60)
Glucose, Bld: 115 mg/dL — ABNORMAL HIGH (ref 70–99)
Potassium: 4 mmol/L (ref 3.5–5.1)
Sodium: 137 mmol/L (ref 135–145)
Total Bilirubin: 0.7 mg/dL (ref 0.3–1.2)
Total Protein: 7.1 g/dL (ref 6.5–8.1)

## 2020-08-24 LAB — LACTATE DEHYDROGENASE: LDH: 165 U/L (ref 98–192)

## 2020-08-24 LAB — CBC WITH DIFFERENTIAL/PLATELET
Abs Immature Granulocytes: 0.02 10*3/uL (ref 0.00–0.07)
Basophils Absolute: 0.1 10*3/uL (ref 0.0–0.1)
Basophils Relative: 1 %
Eosinophils Absolute: 0.5 10*3/uL (ref 0.0–0.5)
Eosinophils Relative: 8 %
HCT: 47.5 % (ref 39.0–52.0)
Hemoglobin: 16.6 g/dL (ref 13.0–17.0)
Immature Granulocytes: 0 %
Lymphocytes Relative: 29 %
Lymphs Abs: 1.8 10*3/uL (ref 0.7–4.0)
MCH: 32.4 pg (ref 26.0–34.0)
MCHC: 34.9 g/dL (ref 30.0–36.0)
MCV: 92.6 fL (ref 80.0–100.0)
Monocytes Absolute: 0.7 10*3/uL (ref 0.1–1.0)
Monocytes Relative: 11 %
Neutro Abs: 3.2 10*3/uL (ref 1.7–7.7)
Neutrophils Relative %: 51 %
Platelets: 272 10*3/uL (ref 150–400)
RBC: 5.13 MIL/uL (ref 4.22–5.81)
RDW: 13 % (ref 11.5–15.5)
WBC: 6.4 10*3/uL (ref 4.0–10.5)
nRBC: 0 % (ref 0.0–0.2)

## 2020-08-24 NOTE — Assessment & Plan Note (Addendum)
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  S/p mesenteric lymph node biopsy.  February 2022 CT scan-stable approximately 4 cm mesenteric mass.  No new progressive disease.  Patient is asymptomatic.  Continue surveillance every 6 months.  Imaging on annual basis.  Again reminded the patient of potential symptoms of progressive lymphoma.  He will call us if symptoms occur.  #Prostate cancer-s/p RP [6886].  PSA normal-continue follow-up with urology. Await PSA today.   # DISPOSITION:  # follow up in 6 months- MD; labs- cbc/cmp;LDH; CT CAP--Dr.B

## 2020-08-24 NOTE — Progress Notes (Signed)
[facility] NOTE  Patient Care Team: Sharyne Peach, MD as PCP - General (Family Medicine) Margaretha Sheffield, MD as Consulting Physician (Otolaryngology) Cammie Sickle, MD as Consulting Physician (Internal Medicine)  CHIEF COMPLAINTS/PURPOSE OF CONSULTATION: mesenteric  #  Oncology History Overview Note  #Prostate cancer-s/p RP- Dr.Border; July 2020- non-detectable.  #June 2021-CT scan-3.5 cm mesenteric mass [incidental]; # July 30th 2021- CT-Biopsy- # DIAGNOSIS: A. 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  Patient denies any nausea vomiting.  No fevers or chills.  Denies any abdominal pain.  No new lumps or bumps.  Review of Systems  Constitutional:  Negative for chills, diaphoresis, fever, malaise/fatigue and weight loss.  HENT:  Negative for nosebleeds and sore throat.   Eyes:  Negative for double vision.  Respiratory:  Negative for cough, hemoptysis, sputum production, shortness of breath and wheezing.   Cardiovascular:  Negative for chest pain, palpitations, orthopnea and leg swelling.  Gastrointestinal:  Negative for abdominal pain, blood in stool, constipation,  diarrhea, heartburn, melena, nausea and vomiting.  Genitourinary:  Negative for dysuria, frequency and urgency.  Musculoskeletal:  Positive for back pain and joint pain.  Skin: Negative.  Negative for itching and rash.  Neurological:  Negative for dizziness, tingling, focal weakness, weakness and headaches.  Endo/Heme/Allergies:  Does not bruise/bleed easily.  Psychiatric/Behavioral:  Negative for depression. The patient is not nervous/anxious and does not have insomnia.     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 03/13/2013   Fractured pelvis ([place]) 06/25/2019   from fall off of ladder   GERD (gastroesophageal reflux disease)    Hypercholesteremia    Hypercholesterolemia 03/13/2013   Hypertension    Prostate cancer [facility])    prostate cancer     SURGICAL HISTORY: Past Surgical History:  Procedure Laterality Date   LAPAROTOMY N/A 08/14/2019   Procedure: EXPLORATORY LAPAROTOMY EXCISION MESENTERIC MASS;  Surgeon: Robert Bellow, MD;  Location: [facility] ORS;  Service: General;  Laterality: N/A;   LYMPHADENECTOMY Bilateral 02/04/2015   Procedure: BILATERAL PELVIC LYMPHADENECTOMY;  Surgeon: Raynelle Bring, MD;  Location: [facility]S;  Service: Urology;  Laterality: Bilateral;   ROBOT ASSISTED LAPAROSCOPIC RADICAL PROSTATECTOMY N/A 02/04/2015   Procedure: XI ROBOTIC ASSISTED LAPAROSCOPIC RADICAL PROSTATECTOMY LEVEL 2;  Surgeon: Raynelle Bring, MD;  Location: [facility] ORS;  Service: Urology;  Laterality: N/A;    SOCIAL HISTORY: Social History   Socioeconomic History   Marital status: Married    Spouse name: Not on file   Number of children: Not on file   Years of  education: Not on file   Highest education level: Not on file  Occupational History   Not on file  Tobacco Use   Smoking status: Never    Smokeless tobacco: Never  Vaping Use   Vaping Use: Never used  Substance and Sexual Activity   Alcohol use: Yes    Comment: 2-4 ounces almost every day    Drug use: No   Sexual activity: Not on file  Other Topics Concern   Not on file  Social History Narrative   Lives in [place]; Dealer; never smoked; whiskey every day.    Social Determinants of Health   Financial Resource Strain: Not on file  Food Insecurity: Not on file  Transportation Needs: Not on file  Physical Activity: Not on file  Stress: Not on file  Social Connections: Not on file  Intimate Partner Violence: Not on file    FAMILY HISTORY: Family History  Problem Relation Age of Onset   Diabetes Mellitus II Other    Hyperlipidemia Other    Stroke Other    Hypertension Other    Kidney cancer Sister        survived   Hypertension Father    Prostate cancer Neg Hx    Tuberculosis Neg Hx     ALLERGIES:  is allergic to atorvastatin.  MEDICATIONS:  Current Outpatient Medications  Medication Sig Dispense Refill   acetaminophen (TYLENOL) 500 MG tablet Take 1,000 mg by mouth every 6 (six) hours as needed for moderate pain.      aspirin EC 81 MG tablet Take 81 mg by mouth daily. Swallow whole.     Calcium Carb-Cholecalciferol (CALCIUM 600 + D PO) Take 1 tablet by mouth 2 (two) times daily.     Liniments (DEEP BLUE RELIEF EX) Apply 1 application topically 2 (two) times daily as needed (muscle pain.).     lisinopril (PRINIVIL,ZESTRIL) 5 MG tablet TAKE 1 TABLET BY MOUTH DAILY (Patient taking differently: Take 5 mg by mouth daily.) 30 tablet 0   metoprolol tartrate (LOPRESSOR) 25 MG tablet TAKE 1/2 TABLET BY MOUTH TWICE A DAY (Patient taking differently: Take 12.5 mg by mouth 2 (two) times daily.) 30 tablet 0   Multiple Vitamin (MULTIVITAMIN) tablet Take 1 tablet by mouth daily.     simvastatin (ZOCOR) 10 MG tablet Take 10 mg by mouth at bedtime.      No current facility-administered medications for this visit.       Marland Kitchen  PHYSICAL EXAMINATION: ECOG PERFORMANCE STATUS: 0 - Asymptomatic  Vitals:   08/24/20 0954  BP: 137/65  Pulse: (!) 52  Resp: 20  Temp: (!) 97.3 F (36.3 C)   Filed Weights   08/24/20 0954  Weight: 182 lb 5.1 oz (82.7 kg)    Physical Exam Constitutional:      Comments: Accompanied by his wife.  He is walking independently.  HENT:     Head: Normocephalic and atraumatic.     Mouth/Throat:     Pharynx: No oropharyngeal exudate.  Eyes:     Pupils: Pupils are equal, round, and reactive to light.  Cardiovascular:     Rate and Rhythm: Normal rate and regular rhythm.  Pulmonary:     Effort: Pulmonary effort is normal. No respiratory distress.     Breath sounds: Normal breath sounds. No wheezing.  Abdominal:     General: Bowel sounds are normal. There is no distension.     Palpations: Abdomen is soft. There is no mass.     Tenderness: There  is no abdominal tenderness. There is no guarding or rebound.  Musculoskeletal:        General: No tenderness. Normal range of motion.     Cervical back: Normal range of motion and neck supple.  Skin:    General: Skin is warm.  Neurological:     Mental Status: He is alert and oriented to person, place, and time.  Psychiatric:        Mood and Affect: Affect normal.     LABORATORY DATA:  I have reviewed the data as listed Lab Results  Component Value Date   WBC 6.4 08/24/2020   HGB 16.6 08/24/2020   HCT 47.5 08/24/2020   MCV 92.6 08/24/2020   PLT 272 08/24/2020   Recent Labs    02/19/20 0937 02/25/20 1313 08/24/20 0949  NA  --  136 137  K  --  3.9 4.0  CL  --  99 101  CO2  --  27 28  GLUCOSE  --  115* 115*  BUN  --  19 17  CREATININE 0.90 1.01 1.10  CALCIUM  --  9.1 8.9  GFRNONAA  --  >60 >60  PROT  --  7.0 7.1  ALBUMIN  --  4.2 4.1  AST  --  26 28  ALT  --  23 21  ALKPHOS  --  57 61  BILITOT  --  0.8 0.7    RADIOGRAPHIC STUDIES: I have personally reviewed the radiological images as listed and agreed with the  findings in the report. No results found.   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  S/p mesenteric lymph node biopsy.  February 2022 CT scan-stable approximately 4 cm mesenteric mass.  No new progressive disease.  Patient is asymptomatic.  Continue surveillance every 6 months.  Imaging on annual basis.  Again reminded the patient of potential symptoms of progressive lymphoma.  He will call us if symptoms occur.  #Prostate cancer-s/p RP [3005].  PSA normal-continue follow-up with urology. Await PSA today.   # DISPOSITION:  # follow up in 6 months- MD; labs- cbc/cmp;LDH; CT CAP--Dr.B  All questions were answered. The patient knows to call the clinic with any problems, questions or concerns.    Cammie Sickle, MD 08/24/2020 10:18 AM

## 2020-08-25 LAB — PSA: Prostatic Specific Antigen: <0.01 ng/mL (ref 0.00–4.00)

## 2021-02-15 ENCOUNTER — Other Ambulatory Visit: Payer: Self-pay

## 2021-02-15 ENCOUNTER — Ambulatory Visit
Admission: RE | Admit: 2021-02-15 | Discharge: 2021-02-15 | Disposition: A | Discharge status: Home / Self Care | Payer: Medicare PPO | Source: Ambulatory Visit | Attending: Internal Medicine | Admitting: Internal Medicine

## 2021-02-15 ENCOUNTER — Inpatient Hospital Stay: Admission: RE | Admit: 2021-02-15 | Payer: Medicare PPO | Source: Ambulatory Visit

## 2021-02-15 DIAGNOSIS — C8583 Other specified types of non-Hodgkin lymphoma, intra-abdominal lymph nodes: Secondary | ICD-10-CM | POA: Diagnosis not present

## 2021-02-15 LAB — POCT I-STAT CREATININE: Creatinine, Ser: 1.2 mg/dL (ref 0.61–1.24)

## 2021-02-15 MED ORDER — IOHEXOL 300 MG/ML  SOLN
100.0000 mL | Freq: Once | INTRAMUSCULAR | Status: AC | PRN
  Administered 2021-02-15: 100 mL via INTRAVENOUS

## 2021-02-22 ENCOUNTER — Encounter: Payer: Self-pay | Admitting: Internal Medicine

## 2021-02-22 ENCOUNTER — Other Ambulatory Visit: Payer: Self-pay

## 2021-02-22 ENCOUNTER — Inpatient Hospital Stay: Payer: Medicare PPO | Admitting: Internal Medicine

## 2021-02-22 ENCOUNTER — Inpatient Hospital Stay: Payer: Medicare PPO | Attending: Internal Medicine

## 2021-02-22 VITALS — BP 129/82 | HR 51 | Temp 99.0°F | Wt 181.8 lb

## 2021-02-22 DIAGNOSIS — C8213 Follicular lymphoma grade II, intra-abdominal lymph nodes: Secondary | ICD-10-CM | POA: Insufficient documentation

## 2021-02-22 DIAGNOSIS — Z9079 Acquired absence of other genital organ(s): Secondary | ICD-10-CM | POA: Diagnosis not present

## 2021-02-22 DIAGNOSIS — C61 Malignant neoplasm of prostate: Secondary | ICD-10-CM

## 2021-02-22 DIAGNOSIS — Z7982 Long term (current) use of aspirin: Secondary | ICD-10-CM | POA: Insufficient documentation

## 2021-02-22 DIAGNOSIS — Z79899 Other long term (current) drug therapy: Secondary | ICD-10-CM | POA: Diagnosis not present

## 2021-02-22 DIAGNOSIS — Z8546 Personal history of malignant neoplasm of prostate: Secondary | ICD-10-CM | POA: Insufficient documentation

## 2021-02-22 DIAGNOSIS — Z125 Encounter for screening for malignant neoplasm of prostate: Secondary | ICD-10-CM | POA: Diagnosis not present

## 2021-02-22 DIAGNOSIS — C8583 Other specified types of non-Hodgkin lymphoma, intra-abdominal lymph nodes: Secondary | ICD-10-CM

## 2021-02-22 LAB — CBC WITH DIFFERENTIAL/PLATELET
Abs Immature Granulocytes: 0.03 10*3/uL (ref 0.00–0.07)
Basophils Absolute: 0.1 10*3/uL (ref 0.0–0.1)
Basophils Relative: 1 %
Eosinophils Absolute: 0.5 10*3/uL (ref 0.0–0.5)
Eosinophils Relative: 7 %
HCT: 49.1 % (ref 39.0–52.0)
Hemoglobin: 16.5 g/dL (ref 13.0–17.0)
Immature Granulocytes: 0 %
Lymphocytes Relative: 31 %
Lymphs Abs: 2.1 10*3/uL (ref 0.7–4.0)
MCH: 31 pg (ref 26.0–34.0)
MCHC: 33.6 g/dL (ref 30.0–36.0)
MCV: 92.3 fL (ref 80.0–100.0)
Monocytes Absolute: 0.9 10*3/uL (ref 0.1–1.0)
Monocytes Relative: 13 %
Neutro Abs: 3.3 10*3/uL (ref 1.7–7.7)
Neutrophils Relative %: 48 %
Platelets: 259 10*3/uL (ref 150–400)
RBC: 5.32 MIL/uL (ref 4.22–5.81)
RDW: 12.6 % (ref 11.5–15.5)
WBC: 6.9 10*3/uL (ref 4.0–10.5)
nRBC: 0 % (ref 0.0–0.2)

## 2021-02-22 LAB — PSA: Prostatic Specific Antigen: <0.01 ng/mL (ref 0.00–4.00)

## 2021-02-22 LAB — COMPREHENSIVE METABOLIC PANEL
ALT: 20 U/L (ref 0–44)
AST: 24 U/L (ref 15–41)
Albumin: 4.1 g/dL (ref 3.5–5.0)
Alkaline Phosphatase: 56 U/L (ref 38–126)
Anion gap: 8 (ref 5–15)
BUN: 18 mg/dL (ref 8–23)
CO2: 25 mmol/L (ref 22–32)
Calcium: 8.9 mg/dL (ref 8.9–10.3)
Chloride: 102 mmol/L (ref 98–111)
Creatinine, Ser: 0.97 mg/dL (ref 0.61–1.24)
GFR, Estimated: >60 mL/min (ref >60)
Glucose, Bld: 92 mg/dL (ref 70–99)
Potassium: 3.9 mmol/L (ref 3.5–5.1)
Sodium: 135 mmol/L (ref 135–145)
Total Bilirubin: 0.4 mg/dL (ref 0.3–1.2)
Total Protein: 7 g/dL (ref 6.5–8.1)

## 2021-02-22 LAB — LACTATE DEHYDROGENASE: LDH: 134 U/L (ref 98–192)

## 2021-02-22 NOTE — Assessment & Plan Note (Addendum)
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  S/p mesenteric lymph node biopsy. JULY 2021-at Dx: 3.5cm ; CT FEB 2023- Increase in size of the dominant 4.6 x 3.9 cm lymph node in the right central mesentery. Otherwise no significant interval change in the mesenteric and retroperitoneal lymph nodes. No new areas of adenopathy in the abdomen or pelvis.    # Discussed option of continued surveillance versus weekly rituximab x4 infusions vs. involved field radiation.  Patient is interested in treatment options.  Would recommend a PET scan for further evaluation.  Will defer to Dr. Donella Stade.  Discussed with Dr. Donella Stade.   # Prostate cancer-s/p RP [2017].  PSA normal-continue follow-up with urology [Dr.Borden; No fu]. Await PSA today.   # DISPOSITION:  # PET scan ASAP # referral to Dr.Chrystal re: Follicular lymphoma of the abdomen,  # follow up in 2-3 days after PET scan- No labs-Dr.B

## 2021-02-22 NOTE — Progress Notes (Signed)
[name] NOTE  Patient Care Team: Sharyne Peach, MD as PCP - General (Family Medicine) Margaretha Sheffield, MD as Consulting Physician (Otolaryngology) Cammie Sickle, MD as Consulting Physician (Internal Medicine)  CHIEF COMPLAINTS/PURPOSE OF CONSULTATION: mesenteric  #  Oncology History Overview Note  #Prostate cancer-s/p RP- Dr.Border; July 2020- non-detectable.  #June 2021-CT scan-3.5 cm mesenteric mass [incidental]; # July 30th 2021- CT-Biopsy- # DIAGNOSIS: A. 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  Accompanied by his wife. Patrick Pena 71 y.o.  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  Patient denies any nausea vomiting.  No fevers or chills.  Denies any abdominal pain.  No new lumps or bumps.  Review of Systems  Constitutional:  Negative for chills, diaphoresis, fever, malaise/fatigue and weight loss.  HENT:  Negative for nosebleeds and sore throat.   Eyes:  Negative for double vision.  Respiratory:  Negative for cough, hemoptysis, sputum production, shortness of breath and wheezing.   Cardiovascular:  Negative for chest pain, palpitations, orthopnea and leg swelling.   Gastrointestinal:  Negative for abdominal pain, blood in stool, constipation, diarrhea, heartburn, melena, nausea and vomiting.  Genitourinary:  Negative for dysuria, frequency and urgency.  Musculoskeletal:  Positive for back pain and joint pain.  Skin: Negative.  Negative for itching and rash.  Neurological:  Negative for dizziness, tingling, focal weakness, weakness and headaches.  Endo/Heme/Allergies:  Does not bruise/bleed easily.  Psychiatric/Behavioral:  Negative for depression. The patient is not nervous/anxious and does not have insomnia.     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 03/13/2013   Fractured pelvis ([place]) 06/25/2019   from fall off of ladder   GERD (gastroesophageal reflux disease)    Hypercholesteremia    Hypercholesterolemia 03/13/2013   Hypertension    Prostate cancer [facility])    prostate cancer     SURGICAL HISTORY: Past Surgical History:  Procedure Laterality Date   LAPAROTOMY N/A 08/14/2019   Procedure: EXPLORATORY LAPAROTOMY EXCISION MESENTERIC MASS;  Surgeon: Robert Bellow, MD;  Location: [facility] ORS;  Service: General;  Laterality: N/A;   LYMPHADENECTOMY Bilateral 02/04/2015   Procedure: BILATERAL PELVIC LYMPHADENECTOMY;  Surgeon: Raynelle Bring, MD;  Location: [facility];  Service: Urology;  Laterality: Bilateral;   ROBOT ASSISTED LAPAROSCOPIC RADICAL PROSTATECTOMY N/A 02/04/2015   Procedure: XI ROBOTIC ASSISTED LAPAROSCOPIC RADICAL PROSTATECTOMY LEVEL 2;  Surgeon: Raynelle Bring, MD;  Location: [facility];  Service: Urology;  Laterality: N/A;    SOCIAL HISTORY: Social History   Socioeconomic History   Marital status: Married    Spouse name: Not on file  Number of children: Not on file   Years of education: Not on file   Highest education level: Not on file   Occupational History   Not on file  Tobacco Use   Smoking status: Never   Smokeless tobacco: Never  Vaping Use   Vaping Use: Never used  Substance and Sexual Activity   Alcohol use: Yes    Comment: 2-4 ounces almost every day    Drug use: No   Sexual activity: Not on file  Other Topics Concern   Not on file  Social History Narrative   Lives in [place]; Dealer; never smoked; whiskey every day.    Social Determinants of Health   Financial Resource Strain: Not on file  Food Insecurity: Not on file  Transportation Needs: Not on file  Physical Activity: Not on file  Stress: Not on file  Social Connections: Not on file  Intimate Partner Violence: Not on file    FAMILY HISTORY: Family History  Problem Relation Age of Onset   Diabetes Mellitus II Other    Hyperlipidemia Other    Stroke Other    Hypertension Other    Kidney cancer Sister        survived   Hypertension Father    Prostate cancer Neg Hx    Tuberculosis Neg Hx     ALLERGIES:  is allergic to atorvastatin.  MEDICATIONS:  Current Outpatient Medications  Medication Sig Dispense Refill   acetaminophen (TYLENOL) 500 MG tablet Take 1,000 mg by mouth every 6 (six) hours as needed for moderate pain.      aspirin EC 81 MG tablet Take 81 mg by mouth daily. Swallow whole.     Calcium Carb-Cholecalciferol (CALCIUM 600 + D PO) Take 1 tablet by mouth 2 (two) times daily.     famotidine (PEPCID) 40 MG tablet Take 1 tablet by mouth at bedtime.     Liniments (DEEP BLUE RELIEF EX) Apply 1 application topically 2 (two) times daily as needed (muscle pain.).     lisinopril (PRINIVIL,ZESTRIL) 5 MG tablet TAKE 1 TABLET BY MOUTH DAILY (Patient taking differently: Take 5 mg by mouth daily.) 30 tablet 0   metoprolol tartrate (LOPRESSOR) 25 MG tablet TAKE 1/2 TABLET BY MOUTH TWICE A DAY (Patient taking differently: Take 12.5 mg by mouth 2 (two) times daily.) 30 tablet 0   Multiple Vitamin (MULTIVITAMIN) tablet Take 1 tablet by mouth  daily.     simvastatin (ZOCOR) 10 MG tablet Take 10 mg by mouth at bedtime.      No current facility-administered medications for this visit.      Marland Kitchen  PHYSICAL EXAMINATION: ECOG PERFORMANCE STATUS: 0 - Asymptomatic  Vitals:   02/22/21 1031  BP: 129/82  Pulse: (!) 51  Temp: 99 F (37.2 C)  SpO2: 98%   Filed Weights   02/22/21 1031  Weight: 181 lb 12.8 oz (82.5 kg)    Physical Exam Constitutional:      Comments: Accompanied by his wife.  He is walking independently.  HENT:     Head: Normocephalic and atraumatic.     Mouth/Throat:     Pharynx: No oropharyngeal exudate.  Eyes:     Pupils: Pupils are equal, round, and reactive to light.  Cardiovascular:     Rate and Rhythm: Normal rate and regular rhythm.  Pulmonary:     Effort: Pulmonary effort is normal. No respiratory distress.     Breath sounds: Normal breath sounds. No wheezing.  Abdominal:     General: Bowel  sounds are normal. There is no distension.     Palpations: Abdomen is soft. There is no mass.     Tenderness: There is no abdominal tenderness. There is no guarding or rebound.  Musculoskeletal:        General: No tenderness. Normal range of motion.     Cervical back: Normal range of motion and neck supple.  Skin:    General: Skin is warm.  Neurological:     Mental Status: He is alert and oriented to person, place, and time.  Psychiatric:        Mood and Affect: Affect normal.     LABORATORY DATA:  I have reviewed the data as listed Lab Results  Component Value Date   WBC 6.9 02/22/2021   HGB 16.5 02/22/2021   HCT 49.1 02/22/2021   MCV 92.3 02/22/2021   PLT 259 02/22/2021   Recent Labs    02/25/20 1313 08/24/20 0949 02/15/21 0912 02/22/21 1019  NA 136 137  --  135  K 3.9 4.0  --  3.9  CL 99 101  --  102  CO2 27 28  --  25  GLUCOSE 115* 115*  --  92  BUN 19 17  --  18  CREATININE 1.01 1.10 1.20 0.97  CALCIUM 9.1 8.9  --  8.9  GFRNONAA >60 >60  --  >60  PROT 7.0 7.1  --  7.0  ALBUMIN  4.2 4.1  --  4.1  AST 26 28  --  24  ALT 23 21  --  20  ALKPHOS 57 61  --  56  BILITOT 0.8 0.7  --  0.4    RADIOGRAPHIC STUDIES: I have personally reviewed the radiological images as listed and agreed with the findings in the report. CT CHEST ABDOMEN PELVIS W CONTRAST  Result Date: 02/15/2021 CLINICAL DATA:  History of non-Hodgkin's lymphoma, follicular lymphoma, follow-up EXAM: CT CHEST, ABDOMEN, AND PELVIS WITH CONTRAST TECHNIQUE: Multidetector CT imaging of the chest, abdomen and pelvis was performed following the standard protocol during bolus administration of intravenous contrast. RADIATION DOSE REDUCTION: This exam was performed according to the departmental dose-optimization program which includes automated exposure control, adjustment of the mA and/or kV according to patient size and/or use of iterative reconstruction technique. CONTRAST:  184mL OMNIPAQUE IOHEXOL 300 MG/ML  SOLN COMPARISON:  Multiple priors including most recent CT February 19, 2020 FINDINGS: CT CHEST FINDINGS Cardiovascular: Aortic atherosclerosis without aneurysmal dilation. No central pulmonary embolus on this nondedicated study. Normal size heart. No significant pericardial effusion/thickening. Coronary artery calcifications. Mediastinum/Nodes: No supraclavicular adenopathy. Stable hypodense 3.4 cm left thyroid nodule which was previously evaluated and sample by ultrasound. No pathologically enlarged mediastinal, hilar or axillary lymph nodes. The trachea and esophagus are grossly unremarkable. Lungs/Pleura: No suspicious pulmonary nodules or masses. No focal airspace consolidation. No pleural effusion. No pneumothorax. Musculoskeletal: Thoracic spondylosis. No aggressive lytic or blastic lesion of bone. CT ABDOMEN PELVIS FINDINGS Hepatobiliary: No suspicious hepatic lesion. Gallbladder is unremarkable. No biliary ductal dilation. Pancreas: No pancreatic ductal dilation or evidence of acute inflammation. Spleen: Normal in  size without focal abnormality. Adrenals/Urinary Tract: Bilateral adrenal glands appear normal. No hydronephrosis. Kidneys demonstrate symmetric enhancement and excretion of contrast material. No solid enhancing renal mass. Nondistended urinary bladder. Stomach/Bowel: Radiopaque enteric contrast material traverses the rectum. Stomach is unremarkable for degree of distension. No pathologic dilation of small or large bowel. The appendix and terminal ileum appear normal. Left-sided colonic diverticulosis without findings of acute diverticulitis. No evidence  of acute bowel inflammation. Vascular/Lymphatic: Aortic atherosclerosis without abdominal aortic aneurysm. Increase in size of the dominant lymph node in the right central mesentery now measuring 4.6 x 3.9 cm on image 81/2 previously 3.4 x 3.0 cm. Otherwise the enlarged/prominent mesenteric and retroperitoneal lymph nodes are similar to prior with a previously indexed mesenteric node along the SMA measuring 11 mm in short axis on image 73/2, unchanged from prior and a mesenteric lymph node on image 85/2 measuring 8 mm previously 9 mm. No new areas of adenopathy in the abdomen or pelvis. Reproductive: Prostate gland surgically absent.  Left hydrocele. Other: No significant abdominopelvic free fluid. Mild mesenteric stranding similar prior and likely related to lymphatic congestion. Musculoskeletal: Multilevel degenerative changes spine. No aggressive lytic or blastic lesion of bone. IMPRESSION: 1. Increase in size of the dominant 4.6 x 3.9 cm lymph node in the right central mesentery. Otherwise no significant interval change in the mesenteric and retroperitoneal lymph nodes. No new areas of adenopathy in the abdomen or pelvis. 2. No thoracic adenopathy, splenomegaly or evidence of skeletal involvement. 3. Left-sided colonic diverticulosis without findings of acute diverticulitis. 4. Stable 3.4 cm left thyroid nodule, previously evaluated in sample by ultrasound. 5.   Aortic Atherosclerosis (ICD10-I70.0). Electronically Signed   By: Dahlia Bailiff M.D.   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  S/p mesenteric lymph node biopsy. JULY 2021-at Dx: 3.5cm ; CT FEB 2023- Increase in size of the dominant 4.6 x 3.9 cm lymph node in the right central mesentery. Otherwise no significant interval change in the mesenteric and retroperitoneal lymph nodes. No new areas of adenopathy in the abdomen or pelvis.    # Discussed option of continued surveillance versus weekly rituximab x4 infusions vs. involved field radiation.  Patient is interested in treatment options.  Would recommend a PET scan for further evaluation.  Will defer to Dr. Donella Stade.  Discussed with Dr. Donella Stade.   # Prostate cancer-s/p RP [2017].  PSA normal-continue follow-up with urology [Dr.Borden; No fu]. Await PSA today.   # DISPOSITION:  # PET scan ASAP # referral to Dr.Chrystal re: Follicular lymphoma of the abdomen,  # follow up in 2-3 days after PET scan- No labs-Dr.B   All questions were answered. The patient knows to call the clinic with any problems, questions or concerns.    Cammie Sickle, MD 02/22/2021 12:01 PM

## 2021-02-24 ENCOUNTER — Other Ambulatory Visit: Payer: Self-pay

## 2021-02-24 ENCOUNTER — Ambulatory Visit
Admission: RE | Admit: 2021-02-24 | Discharge: 2021-02-24 | Disposition: A | Discharge status: Home / Self Care | Payer: Medicare PPO | Source: Ambulatory Visit | Attending: Radiation Oncology | Admitting: Radiation Oncology

## 2021-02-24 ENCOUNTER — Encounter: Payer: Self-pay | Admitting: Radiation Oncology

## 2021-02-24 VITALS — BP 155/83 | HR 68 | Resp 16 | Wt 181.9 lb

## 2021-02-24 DIAGNOSIS — Z8051 Family history of malignant neoplasm of kidney: Secondary | ICD-10-CM | POA: Insufficient documentation

## 2021-02-24 DIAGNOSIS — Z7982 Long term (current) use of aspirin: Secondary | ICD-10-CM | POA: Diagnosis not present

## 2021-02-24 DIAGNOSIS — Z79899 Other long term (current) drug therapy: Secondary | ICD-10-CM | POA: Insufficient documentation

## 2021-02-24 DIAGNOSIS — I10 Essential (primary) hypertension: Secondary | ICD-10-CM | POA: Insufficient documentation

## 2021-02-24 DIAGNOSIS — E7800 Pure hypercholesterolemia, unspecified: Secondary | ICD-10-CM | POA: Insufficient documentation

## 2021-02-24 DIAGNOSIS — E785 Hyperlipidemia, unspecified: Secondary | ICD-10-CM | POA: Diagnosis not present

## 2021-02-24 DIAGNOSIS — C61 Malignant neoplasm of prostate: Secondary | ICD-10-CM | POA: Diagnosis not present

## 2021-02-24 DIAGNOSIS — C8213 Follicular lymphoma grade II, intra-abdominal lymph nodes: Secondary | ICD-10-CM | POA: Insufficient documentation

## 2021-02-24 NOTE — Consult Note (Signed)
NEW PATIENT EVALUATION  Name: Patrick Pena  MRN: [id number]  Date:   02/24/2021     DOB: 1950/03/31   This 71 y.o. male patient presents to the clinic for initial evaluation of follicular lymphoma of the abdomen and patient with prior history of prostate cancer.  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  He underwent prostatectomy has had excellent biochemical response over time.  He recently had a fall from a tree work-up including CT scan showed a mass in the abdomen measuring 4.6 x 3.9 cm consistent with lymph nodes in the right central mesentery.  There was no path thoracic adenopathy splenomegaly or evidence of skeletal involvement.  Patient underwent biopsy showing a mesenteric lymph node positive for follicular lymphoma with a preponderance of SJ62 positive follicles.  Tumor was CD10 positive as well as monoclonal kappa B-cell population.  Patient has a PET/CT scheduled for next week.  He is asymptomatic is having no abdominal pain or discomfort.  Specifically denies fever chills night sweats or weight loss.  I been asked to evaluate him for possible radiation therapy.  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    PAST SURGICAL HISTORY:  Past Surgical History:  Procedure Laterality Date   LAPAROTOMY N/A 08/14/2019   Procedure: EXPLORATORY LAPAROTOMY EXCISION MESENTERIC MASS;  Surgeon: Robert Bellow, MD;  Location: [facility] ORS;  Service: General;  Laterality: N/A;   LYMPHADENECTOMY Bilateral 02/04/2015   Procedure: BILATERAL PELVIC LYMPHADENECTOMY;  Surgeon: Raynelle Bring, MD;  Location: [facility] OR[facility];  Service: Urology;  Laterality: Bilateral;   ROBOT ASSISTED LAPAROSCOPIC RADICAL PROSTATECTOMY N/A 02/04/2015   Procedure: XI ROBOTIC ASSISTED LAPAROSCOPIC RADICAL PROSTATECTOMY LEVEL 2;  Surgeon: Raynelle Bring, MD;  Location: [facility] OR[facility];  Service: Urology;  Laterality: N/A;    FAMILY HISTORY: family history includes Diabetes Mellitus II in an other family member; Hyperlipidemia in an other family member; Hypertension in his father and another family member; Kidney cancer in his sister; Stroke in an other family member.  SOCIAL HISTORY:  reports that he has never smoked. He has never used smokeless tobacco. He reports current alcohol use. He reports that he does not use drugs.  ALLERGIES: Atorvastatin  MEDICATIONS:  Current Outpatient Medications  Medication Sig Dispense Refill   acetaminophen (TYLENOL) 500 MG tablet Take 1,000 mg by mouth every 6 (six) hours as needed for moderate pain.      aspirin EC 81 MG tablet Take 81 mg by mouth daily. Swallow whole.     Calcium Carb-Cholecalciferol (CALCIUM 600 + D PO) Take 1 tablet by mouth 2 (two) times daily.     famotidine (PEPCID) 40 MG tablet Take 1 tablet by mouth at bedtime.  Liniments (DEEP BLUE RELIEF EX) Apply 1 application topically 2 (two) times daily as needed (muscle pain.).     lisinopril (PRINIVIL,ZESTRIL) 5 MG tablet TAKE 1 TABLET BY MOUTH DAILY (Patient taking differently: Take 5  mg by mouth daily.) 30 tablet 0   metoprolol tartrate (LOPRESSOR) 25 MG tablet TAKE 1/2 TABLET BY MOUTH TWICE A DAY (Patient taking differently: Take 12.5 mg by mouth 2 (two) times daily.) 30 tablet 0   Multiple Vitamin (MULTIVITAMIN) tablet Take 1 tablet by mouth daily.     simvastatin (ZOCOR) 10 MG tablet Take 10 mg by mouth at bedtime.      No current facility-administered medications for this encounter.    ECOG PERFORMANCE STATUS:  0 - Asymptomatic  REVIEW OF SYSTEMS: Patient denies any weight loss, fatigue, weakness, fever, chills or night sweats. Patient denies any loss of vision, blurred vision. Patient denies any ringing  of the ears or hearing loss. No irregular heartbeat. Patient denies heart murmur or history of fainting. Patient denies any chest pain or pain radiating to her upper extremities. Patient denies any shortness of breath, difficulty breathing at night, cough or hemoptysis. Patient denies any swelling in the lower legs. Patient denies any nausea vomiting, vomiting of blood, or coffee ground material in the vomitus. Patient denies any stomach pain. Patient states has had normal bowel movements no significant constipation or diarrhea. Patient denies any dysuria, hematuria or significant nocturia. Patient denies any problems walking, swelling in the joints or loss of balance. Patient denies any skin changes, loss of hair or loss of weight. Patient denies any excessive worrying or anxiety or significant depression. Patient denies any problems with insomnia. Patient denies excessive thirst, polyuria, polydipsia. Patient denies any swollen glands, patient denies easy bruising or easy bleeding. Patient denies any recent infections, allergies or URI. Patient "s visual fields have not changed significantly in recent time.   PHYSICAL EXAM: BP (!) 155/83 (BP Location: Left Arm, Patient Position: Sitting)    Pulse 68    Resp 16    Wt 181 lb 14.4 oz (82.5 kg)    BMI 31.22 kg/m  No peripheral  adenopathy is noted.  Well-developed well-nourished patient in NAD. HEENT reveals PERLA, EOMI, discs not visualized.  Oral cavity is clear. No oral mucosal lesions are identified. Neck is clear without evidence of cervical or supraclavicular adenopathy. Lungs are clear to A&P. Cardiac examination is essentially unremarkable with regular rate and rhythm without murmur rub or thrill. Abdomen is benign with no organomegaly or masses noted. Motor sensory and DTR levels are equal and symmetric in the upper and lower extremities. Cranial nerves II through XII are grossly intact. Proprioception is intact. No peripheral adenopathy or edema is identified. No motor or sensory levels are noted. Crude visual fields are within normal range.  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  We will review his PET scan prior to simulation risks and benefits of treatment including possible diarrhea fatigue alteration of blood counts skin reaction all were reviewed with the patient and his wife.  I have scheduled simulation after his PET CT scan.  Patient and wife seem to both comprehend  my treatment plan well.  I would like to take this opportunity to thank you for allowing me to participate in the care of your patient.Noreene Filbert, MD

## 2021-03-01 ENCOUNTER — Telehealth: Payer: Self-pay | Admitting: Internal Medicine

## 2021-03-01 NOTE — Telephone Encounter (Signed)
noted 

## 2021-03-01 NOTE — Telephone Encounter (Signed)
Pt wife called to cancel his CT sim appt for 3/9. Rad Onc notified and appt was canceled. They did not want it rescheduled.He will keep his 3/8 PET scan and Follow-up w\ Dr. Jacinto Reap on 3/10.

## 2021-03-09 ENCOUNTER — Encounter (HOSPITAL_COMMUNITY)
Admission: RE | Admit: 2021-03-09 | Discharge: 2021-03-09 | Disposition: A | Discharge status: Home / Self Care | Payer: Medicare PPO | Source: Ambulatory Visit | Attending: Internal Medicine | Admitting: Internal Medicine

## 2021-03-09 ENCOUNTER — Other Ambulatory Visit: Payer: Self-pay

## 2021-03-09 DIAGNOSIS — C8213 Follicular lymphoma grade II, intra-abdominal lymph nodes: Secondary | ICD-10-CM | POA: Insufficient documentation

## 2021-03-09 LAB — GLUCOSE, CAPILLARY: Glucose-Capillary: 113 mg/dL — ABNORMAL HIGH (ref 70–99)

## 2021-03-09 MED ORDER — FLUDEOXYGLUCOSE F - 18 (FDG) INJECTION
9.0000 | Freq: Once | INTRAVENOUS | Status: AC | PRN
  Administered 2021-03-09: 8.95 via INTRAVENOUS

## 2021-03-10 ENCOUNTER — Ambulatory Visit: Payer: Medicare PPO

## 2021-03-11 ENCOUNTER — Other Ambulatory Visit: Payer: Self-pay

## 2021-03-11 ENCOUNTER — Inpatient Hospital Stay: Payer: Medicare PPO | Attending: Internal Medicine | Admitting: Internal Medicine

## 2021-03-11 ENCOUNTER — Encounter: Payer: Self-pay | Admitting: Internal Medicine

## 2021-03-11 VITALS — BP 146/67 | HR 51 | Resp 16 | Wt 187.6 lb

## 2021-03-11 DIAGNOSIS — Z8546 Personal history of malignant neoplasm of prostate: Secondary | ICD-10-CM | POA: Diagnosis not present

## 2021-03-11 DIAGNOSIS — C8213 Follicular lymphoma grade II, intra-abdominal lymph nodes: Secondary | ICD-10-CM | POA: Insufficient documentation

## 2021-03-11 DIAGNOSIS — Z79899 Other long term (current) drug therapy: Secondary | ICD-10-CM | POA: Diagnosis not present

## 2021-03-11 DIAGNOSIS — Z7982 Long term (current) use of aspirin: Secondary | ICD-10-CM | POA: Insufficient documentation

## 2021-03-11 NOTE — Progress Notes (Signed)
Patient denies new problems/concerns today.   °

## 2021-03-11 NOTE — Progress Notes (Signed)
[facility] NOTE  Patient Care Team: Sharyne Peach, MD as PCP - General (Family Medicine) Margaretha Sheffield, MD as Consulting Physician (Otolaryngology) Cammie Sickle, MD as Consulting Physician (Internal Medicine)  CHIEF COMPLAINTS/PURPOSE OF CONSULTATION: mesenteric  #  Oncology History Overview Note  #Prostate cancer-s/p RP- Dr.Border; July 2020- non-detectable.  #June 2021-CT scan-3.5 cm mesenteric mass [incidental]; # July 30th 2021- CT-Biopsy- # DIAGNOSIS: A. 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  Accompanied by his wife.  Patrick Pena 71 y.o.  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  The patient was evaluated by Dr. Danny Lawless for radiation.  However, patient is reluctant in proceeding with radiation at this time.  Patient denies any nausea vomiting.  No fevers or chills.  Denies any abdominal pain.  No new lumps or bumps.  Review of Systems  Constitutional:  Negative for chills, diaphoresis, fever, malaise/fatigue and weight loss.  HENT:  Negative for nosebleeds and sore throat.   Eyes:  Negative for double vision.  Respiratory:  Negative for cough, hemoptysis,  sputum production, shortness of breath and wheezing.   Cardiovascular:  Negative for chest pain, palpitations, orthopnea and leg swelling.  Gastrointestinal:  Negative for abdominal pain, blood in stool, constipation, diarrhea, heartburn, melena, nausea and vomiting.  Genitourinary:  Negative for dysuria, frequency and urgency.  Musculoskeletal:  Positive for back pain and joint pain.  Skin: Negative.  Negative for itching and rash.  Neurological:  Negative for dizziness, tingling, focal weakness, weakness and headaches.  Endo/Heme/Allergies:  Does not bruise/bleed easily.  Psychiatric/Behavioral:  Negative for depression. The patient is not nervous/anxious and does not have insomnia.     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 03/13/2013   Fractured pelvis ([place]) 06/25/2019   from fall off of ladder   GERD (gastroesophageal reflux disease)    Hypercholesteremia    Hypercholesterolemia 03/13/2013   Hypertension    Prostate cancer [facility])    prostate cancer     SURGICAL HISTORY: Past Surgical History:  Procedure Laterality Date   LAPAROTOMY N/A 08/14/2019   Procedure: EXPLORATORY LAPAROTOMY EXCISION MESENTERIC MASS;  Surgeon: Robert Bellow, MD;  Location: [facility] ORS;  Service: General;  Laterality: N/A;   LYMPHADENECTOMY Bilateral 02/04/2015   Procedure: BILATERAL PELVIC LYMPHADENECTOMY;  Surgeon: Raynelle Bring, MD;  Location: [facility] ORS;  Service: Urology;  Laterality: Bilateral;   ROBOT ASSISTED LAPAROSCOPIC RADICAL PROSTATECTOMY N/A 02/04/2015   Procedure: XI ROBOTIC ASSISTED LAPAROSCOPIC RADICAL PROSTATECTOMY LEVEL 2;  Surgeon: Raynelle Bring, MD;  Location: [facility] ORS;  Service: Urology;  Laterality: N/A;  SOCIAL HISTORY: Social History   Socioeconomic History   Marital status: Married     Spouse name: Not on file   Number of children: Not on file   Years of education: Not on file   Highest education level: Not on file  Occupational History   Not on file  Tobacco Use   Smoking status: Never   Smokeless tobacco: Never  Vaping Use   Vaping Use: Never used  Substance and Sexual Activity   Alcohol use: Yes    Comment: 2-4 ounces almost every day    Drug use: No   Sexual activity: Not on file  Other Topics Concern   Not on file  Social History Narrative   Lives in [place]; Dealer; never smoked; whiskey every day.    Social Determinants of Health   Financial Resource Strain: Not on file  Food Insecurity: Not on file  Transportation Needs: Not on file  Physical Activity: Not on file  Stress: Not on file  Social Connections: Not on file  Intimate Partner Violence: Not on file    FAMILY HISTORY: Family History  Problem Relation Age of Onset   Diabetes Mellitus II Other    Hyperlipidemia Other    Stroke Other    Hypertension Other    Kidney cancer Sister        survived   Hypertension Father    Prostate cancer Neg Hx    Tuberculosis Neg Hx     ALLERGIES:  is allergic to atorvastatin.  MEDICATIONS:  Current Outpatient Medications  Medication Sig Dispense Refill   acetaminophen (TYLENOL) 500 MG tablet Take 1,000 mg by mouth every 6 (six) hours as needed for moderate pain.      aspirin EC 81 MG tablet Take 81 mg by mouth daily. Swallow whole.     Calcium Carb-Cholecalciferol (CALCIUM 600 + D PO) Take 1 tablet by mouth 2 (two) times daily.     famotidine (PEPCID) 40 MG tablet Take 1 tablet by mouth at bedtime.     Liniments (DEEP BLUE RELIEF EX) Apply 1 application topically 2 (two) times daily as needed (muscle pain.).     lisinopril (PRINIVIL,ZESTRIL) 5 MG tablet TAKE 1 TABLET BY MOUTH DAILY (Patient taking differently: Take 5 mg by mouth daily.) 30 tablet 0   metoprolol tartrate (LOPRESSOR) 25 MG tablet TAKE 1/2 TABLET BY MOUTH TWICE A DAY (Patient  taking differently: Take 12.5 mg by mouth 2 (two) times daily.) 30 tablet 0   Multiple Vitamin (MULTIVITAMIN) tablet Take 1 tablet by mouth daily.     simvastatin (ZOCOR) 10 MG tablet Take 10 mg by mouth at bedtime.      No current facility-administered medications for this visit.      Marland Kitchen  PHYSICAL EXAMINATION: ECOG PERFORMANCE STATUS: 0 - Asymptomatic  Vitals:   03/11/21 1000  BP: (!) 146/67  Pulse: (!) 51  Resp: 16   Filed Weights   03/11/21 1000  Weight: 187 lb 9.6 oz (85.1 kg)    Physical Exam Constitutional:      Comments: Accompanied by his wife.  He is walking independently.  HENT:     Head: Normocephalic and atraumatic.     Mouth/Throat:     Pharynx: No oropharyngeal exudate.  Eyes:     Pupils: Pupils are equal, round, and reactive to light.  Cardiovascular:     Rate and Rhythm: Normal rate and regular rhythm.  Pulmonary:     Effort: Pulmonary effort is normal. No respiratory distress.  Breath sounds: Normal breath sounds. No wheezing.  Abdominal:     General: Bowel sounds are normal. There is no distension.     Palpations: Abdomen is soft. There is no mass.     Tenderness: There is no abdominal tenderness. There is no guarding or rebound.  Musculoskeletal:        General: No tenderness. Normal range of motion.     Cervical back: Normal range of motion and neck supple.  Skin:    General: Skin is warm.  Neurological:     Mental Status: He is alert and oriented to person, place, and time.  Psychiatric:        Mood and Affect: Affect normal.     LABORATORY DATA:  I have reviewed the data as listed Lab Results  Component Value Date   WBC 6.9 02/22/2021   HGB 16.5 02/22/2021   HCT 49.1 02/22/2021   MCV 92.3 02/22/2021   PLT 259 02/22/2021   Recent Labs    08/24/20 0949 02/15/21 0912 02/22/21 1019  NA 137  --  135  K 4.0  --  3.9  CL 101  --  102  CO2 28  --  25  GLUCOSE 115*  --  92  BUN 17  --  18  CREATININE 1.10 1.20 0.97  CALCIUM  8.9  --  8.9  GFRNONAA >60  --  >60  PROT 7.1  --  7.0  ALBUMIN 4.1  --  4.1  AST 28  --  24  ALT 21  --  20  ALKPHOS 61  --  56  BILITOT 0.7  --  0.4    RADIOGRAPHIC STUDIES: I have personally reviewed the radiological images as listed and agreed with the findings in the report. CT CHEST ABDOMEN PELVIS W CONTRAST  Result Date: 02/15/2021 CLINICAL DATA:  History of non-Hodgkin's lymphoma, follicular lymphoma, follow-up EXAM: CT CHEST, ABDOMEN, AND PELVIS WITH CONTRAST TECHNIQUE: Multidetector CT imaging of the chest, abdomen and pelvis was performed following the standard protocol during bolus administration of intravenous contrast. RADIATION DOSE REDUCTION: This exam was performed according to the departmental dose-optimization program which includes automated exposure control, adjustment of the mA and/or kV according to patient size and/or use of iterative reconstruction technique. CONTRAST:  129m OMNIPAQUE IOHEXOL 300 MG/ML  SOLN COMPARISON:  Multiple priors including most recent CT February 19, 2020 FINDINGS: CT CHEST FINDINGS Cardiovascular: Aortic atherosclerosis without aneurysmal dilation. No central pulmonary embolus on this nondedicated study. Normal size heart. No significant pericardial effusion/thickening. Coronary artery calcifications. Mediastinum/Nodes: No supraclavicular adenopathy. Stable hypodense 3.4 cm left thyroid nodule which was previously evaluated and sample by ultrasound. No pathologically enlarged mediastinal, hilar or axillary lymph nodes. The trachea and esophagus are grossly unremarkable. Lungs/Pleura: No suspicious pulmonary nodules or masses. No focal airspace consolidation. No pleural effusion. No pneumothorax. Musculoskeletal: Thoracic spondylosis. No aggressive lytic or blastic lesion of bone. CT ABDOMEN PELVIS FINDINGS Hepatobiliary: No suspicious hepatic lesion. Gallbladder is unremarkable. No biliary ductal dilation. Pancreas: No pancreatic ductal dilation or  evidence of acute inflammation. Spleen: Normal in size without focal abnormality. Adrenals/Urinary Tract: Bilateral adrenal glands appear normal. No hydronephrosis. Kidneys demonstrate symmetric enhancement and excretion of contrast material. No solid enhancing renal mass. Nondistended urinary bladder. Stomach/Bowel: Radiopaque enteric contrast material traverses the rectum. Stomach is unremarkable for degree of distension. No pathologic dilation of small or large bowel. The appendix and terminal ileum appear normal. Left-sided colonic diverticulosis without findings of acute diverticulitis. No evidence of acute  bowel inflammation. Vascular/Lymphatic: Aortic atherosclerosis without abdominal aortic aneurysm. Increase in size of the dominant lymph node in the right central mesentery now measuring 4.6 x 3.9 cm on image 81/2 previously 3.4 x 3.0 cm. Otherwise the enlarged/prominent mesenteric and retroperitoneal lymph nodes are similar to prior with a previously indexed mesenteric node along the SMA measuring 11 mm in short axis on image 73/2, unchanged from prior and a mesenteric lymph node on image 85/2 measuring 8 mm previously 9 mm. No new areas of adenopathy in the abdomen or pelvis. Reproductive: Prostate gland surgically absent.  Left hydrocele. Other: No significant abdominopelvic free fluid. Mild mesenteric stranding similar prior and likely related to lymphatic congestion. Musculoskeletal: Multilevel degenerative changes spine. No aggressive lytic or blastic lesion of bone. IMPRESSION: 1. Increase in size of the dominant 4.6 x 3.9 cm lymph node in the right central mesentery. Otherwise no significant interval change in the mesenteric and retroperitoneal lymph nodes. No new areas of adenopathy in the abdomen or pelvis. 2. No thoracic adenopathy, splenomegaly or evidence of skeletal involvement. 3. Left-sided colonic diverticulosis without findings of acute diverticulitis. 4. Stable 3.4 cm left thyroid nodule,  previously evaluated in sample by ultrasound. 5.  Aortic Atherosclerosis (ICD10-I70.0). Electronically Signed   By: Dahlia Bailiff M.D.   On: 02/15/2021 13:21   NM PET Image Restage (PS) Skull Base to Thigh (F-18 FDG)  Result Date: 03/09/2021 CLINICAL DATA:  Subsequent treatment strategy for follicular lymphoma. EXAM: NUCLEAR MEDICINE PET SKULL BASE TO THIGH TECHNIQUE: 8.95 mCi F-18 FDG was injected intravenously. Full-ring PET imaging was performed from the skull base to thigh after the radiotracer. CT data was obtained and used for attenuation correction and anatomic localization. Fasting blood glucose: 113 mg/dl COMPARISON:  PET-CT 07/03/2019 FINDINGS: Mediastinal blood pool activity: SUV max 1.70 Liver activity: SUV max 3.74 NECK: Stable multinodular left thyroid goiter with calcifications. The largest lesion measures 3 cm and has been previously evaluated with ultrasound and biopsy. No hypermetabolism. This has been evaluated on previous imaging. (ref: J Am Coll Radiol. 2015 Feb;12(2): 143-50). No hypermetabolic neck mass or lymphadenopathy. Incidental CT findings: Advanced bilateral carotid artery calcifications. CHEST: No hypermetabolic mediastinal or hilar nodes. No suspicious pulmonary nodules on the CT scan. Stable scattered small mediastinal hilar lymph nodes without hypermetabolism. Incidental CT findings: Stable vascular calcifications. No pulmonary lesions. ABDOMEN/PELVIS: Stable hazy interstitial changes in small bowel mesentery with numerous small/borderline lymph nodes. No change in size when compared to the prior CT scan. Index node on image 143/4 measures 10 mm and previously measured 11 mm. 9 mm node on image 137/4 previously measured 9 mm. The large nodal mass in the right upper quadrant just inferior to the duodenal sweep measures 4.3 x 3.4 cm on image 148/4. SUV max is 8.30. This previously measured 3.4 x 3.3 cm and the SUV max was 7.99. No enlarged or hypermetabolic pelvic or inguinal  lymph nodes. Incidental CT findings: Stable aortic and branch vessel calcifications. No aneurysm. Stable descending and sigmoid colon diverticulosis. SKELETON: No focal hypermetabolic activity to suggest skeletal metastasis. Incidental CT findings: none IMPRESSION: 1. Slight interval enlargement and slight increase in FDG uptake in the dominant right upper quadrant nodal lesion (Deauville 5). 2. Stable small scattered mediastinal/hilar, mesenteric and retroperitoneal lymph nodes without hypermetabolism. 3. Stable vascular calcifications but no aneurysm. 4. Stable left thyroid lesion previously evaluated with ultrasound and biopsy. Electronically Signed   By: Marijo Sanes M.D.   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  S/p mesenteric lymph node biopsy. JULY 2021-at Dx: 3.5cm ; CT FEB 2023- Increase in size of the dominant 4.6 x 3.9 cm lymph node in the right central mesentery. PET MARCH 2023-  Slight interval enlargement and slight increase in FDG uptake in the dominant right upper quadrant nodal lesion (Deauville 5).  Stable small scattered mediastinal/hilar, mesenteric and retroperitoneal lymph nodes without hypermetabolism.  #Patient s/p evaluation with radiation oncology in February.  However, concern again about the potential side effects; rather slow growth of the disease in the last 2 years or so-he is leaning towards surveillance at this time.  Discussed the rare but possible concern for bowel obstruction tumor does grow significantly-however repeat imaging in 6 months would be helpful.  Understands that if he has any symptoms he will reach out to [place] ASAP patient and wife in agreement.  # Prostate cancer-s/p RP [[id number]].  PSA normal-continue follow-up with urology [Dr.Borden; No fu]. PSA -WNL.   #Complementary therapies-patient has been getting vitamin C/nicotinamide infusion to  improve his overall general health.  Discussed that these treatments are unlikely to treat his malignancy. ; however I will defer to his provider for ongoing infusions.   #Incidental findings on Imaging  PET scan March , 2023:3. Stable vascular calcifications but no aneurysm;  Stable left thyroid lesion previously evaluated with ultrasound and biopsy. I reviewed/discussed/counseled the patient.    # DISPOSITION:  # follow up in 6 months- MD; labs- cbc/cmp;LDH; PSA- CT AP--Dr.B  # I reviewed the blood work- with the patient in detail; also reviewed the imaging independently [as summarized above]; and with the patient in detail.     All questions were answered. The patient knows to call the clinic with any problems, questions or concerns.    Cammie Sickle, MD 03/11/2021 5:54 PM

## 2021-03-11 NOTE — Assessment & Plan Note (Addendum)
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  S/p mesenteric lymph node biopsy. JULY 2021-at Dx: 3.5cm ; CT FEB 2023- Increase in size of the dominant 4.6 x 3.9 cm lymph node in the right central mesentery. PET MARCH 2023-  Slight interval enlargement and slight increase in FDG uptake in the dominant right upper quadrant nodal lesion (Deauville 5).  Stable small scattered mediastinal/hilar, mesenteric and retroperitoneal lymph nodes without hypermetabolism. ? ?#Patient s/p evaluation with radiation oncology in February.  However, concern again about the potential side effects; rather slow growth of the disease in the last 2 years or so-he is leaning towards surveillance at this time.  Discussed the rare but possible concern for bowel obstruction tumor does grow significantly-however repeat imaging in 6 months would be helpful.  Understands that if he has any symptoms he will reach out to [place] ASAP patient and wife in agreement. ? ?# Prostate cancer-s/p RP [[id number]].  PSA normal-continue follow-up with urology [Dr.Borden; No fu]. PSA -WNL.  ? ?#Complementary therapies-patient has been getting vitamin C/nicotinamide infusion to improve his overall general health.  Discussed that these treatments are unlikely to treat his malignancy. ; however I will defer to his provider for ongoing infusions.  ? ?#Incidental findings on Imaging  PET scan March , 2023:3. Stable vascular calcifications but no aneurysm;  Stable left thyroid lesion previously evaluated with ultrasound and biopsy. I reviewed/discussed/counseled the patient.  ? ? # DISPOSITION:  ?# follow up in 6 months- MD; labs- cbc/cmp;LDH; PSA- CT AP--Dr.B ? ?# I reviewed the blood work- with the patient in detail; also reviewed the imaging independently [as summarized above]; and with the patient in detail.  ? ? ? ?

## 2021-09-07 ENCOUNTER — Inpatient Hospital Stay: Payer: Medicare PPO | Attending: Internal Medicine

## 2021-09-07 ENCOUNTER — Ambulatory Visit
Admission: RE | Admit: 2021-09-07 | Discharge: 2021-09-07 | Disposition: A | Discharge status: Home / Self Care | Payer: Medicare PPO | Source: Ambulatory Visit | Attending: Internal Medicine | Admitting: Internal Medicine

## 2021-09-07 DIAGNOSIS — C8213 Follicular lymphoma grade II, intra-abdominal lymph nodes: Secondary | ICD-10-CM | POA: Diagnosis not present

## 2021-09-07 DIAGNOSIS — Z8572 Personal history of non-Hodgkin lymphomas: Secondary | ICD-10-CM | POA: Insufficient documentation

## 2021-09-07 DIAGNOSIS — Z9079 Acquired absence of other genital organ(s): Secondary | ICD-10-CM | POA: Insufficient documentation

## 2021-09-07 DIAGNOSIS — Z8546 Personal history of malignant neoplasm of prostate: Secondary | ICD-10-CM | POA: Insufficient documentation

## 2021-09-07 LAB — POCT I-STAT CREATININE: Creatinine, Ser: 1.3 mg/dL — ABNORMAL HIGH (ref 0.61–1.24)

## 2021-09-07 MED ORDER — IOHEXOL 300 MG/ML  SOLN
100.0000 mL | Freq: Once | INTRAMUSCULAR | Status: AC | PRN
  Administered 2021-09-07: 100 mL via INTRAVENOUS

## 2021-09-09 ENCOUNTER — Encounter: Payer: Self-pay | Admitting: Internal Medicine

## 2021-09-09 ENCOUNTER — Inpatient Hospital Stay: Payer: Medicare PPO | Admitting: Internal Medicine

## 2021-09-09 DIAGNOSIS — C8213 Follicular lymphoma grade II, intra-abdominal lymph nodes: Secondary | ICD-10-CM

## 2021-09-09 DIAGNOSIS — Z8572 Personal history of non-Hodgkin lymphomas: Secondary | ICD-10-CM | POA: Diagnosis present

## 2021-09-09 DIAGNOSIS — Z8546 Personal history of malignant neoplasm of prostate: Secondary | ICD-10-CM | POA: Diagnosis present

## 2021-09-09 DIAGNOSIS — Z9079 Acquired absence of other genital organ(s): Secondary | ICD-10-CM | POA: Diagnosis not present

## 2021-09-09 NOTE — Assessment & Plan Note (Addendum)
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  S/p mesenteric lymph node biopsy. JULY 2021-at Dx: 3.5cm ; CT FEB 2023- Increase in size of the dominant 4.6 x 3.9 cm lymph node in the right central mesentery. PET MARCH 2023-  Slight interval enlargement and slight increase in FDG uptake in the dominant right upper quadrant nodal lesion (Deauville 5).  SEP 2023- Stable size of ileocolic mesenteric nodal mass. Additional index lymph nodes are unchanged in the interval No new or progressive disease identified.  #Patient s/p evaluation with radiation oncology in February, 2023. Pt declined [concerns with side effects/ slow growth of the disease in the last 2 years].  Discussed the rare but possible concern for bowel obstruction tumor does grow significantly-however repeat imaging in 6 months would be helpful.  Understands that if he has any symptoms he will reach out to [place] ASAP patient and wife in agreement.  # Prostate cancer-s/p RP [[id number]].  PSA normal-continue follow-up with urology [Dr.Borden; No fu].FEB 2023- PSA -WNL.    # DISPOSITION:  # follow up in 6 months- MD; labs- cbc/cmp;LDH; PSA; CT AP prior-..-Dr.B  # I reviewed the blood work- with the patient in detail; also reviewed the imaging independently [as summarized above]; and with the patient in detail.

## 2021-09-09 NOTE — Progress Notes (Signed)
[facility] NOTE  Patient Care Team: Rolanda Jay, MD as PCP - General (Internal Medicine) Margaretha Sheffield, MD as Consulting Physician (Otolaryngology) Cammie Sickle, MD as Consulting Physician (Internal Medicine)  CHIEF COMPLAINTS/PURPOSE OF CONSULTATION: Lymphoma  #  Oncology History Overview Note  #Prostate cancer-s/p RP- Dr.Border; July 2020- non-detectable.  #June 2021-CT scan-3.5 cm mesenteric mass [incidental]; # July 30th 2021- CT-Biopsy- # DIAGNOSIS: A. 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  Accompanied by his wife.  Patrick Pena 71 y.o.  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   Patient denies any nausea vomiting.  No fevers or chills.  Denies any abdominal pain.  No new lumps or bumps.  Review of Systems  Constitutional:  Negative for chills, diaphoresis, fever, malaise/fatigue and weight loss.  HENT:  Negative for nosebleeds and sore throat.   Eyes:  Negative for double vision.  Respiratory:  Negative for cough, hemoptysis, sputum production, shortness of breath and wheezing.   Cardiovascular:  Negative for chest pain, palpitations, orthopnea and leg  swelling.  Gastrointestinal:  Negative for abdominal pain, blood in stool, constipation, diarrhea, heartburn, melena, nausea and vomiting.  Genitourinary:  Negative for dysuria, frequency and urgency.  Musculoskeletal:  Positive for back pain and joint pain.  Skin: Negative.  Negative for itching and rash.  Neurological:  Negative for dizziness, tingling, focal weakness, weakness and headaches.  Endo/Heme/Allergies:  Does not bruise/bleed easily.  Psychiatric/Behavioral:  Negative for depression. The patient is not nervous/anxious and does not have insomnia.      MEDICAL HISTORY:  Past Medical History:  Diagnosis Date  . Arthritis   . Blood pressure elevated 05/12/2014  . BP (high blood pressure) 03/13/2013  . 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 Fractured pelvis ([place]) 06/25/2019   from fall off of ladder  . GERD (gastroesophageal reflux disease)   . Hypercholesteremia   . Hypercholesterolemia 03/13/2013  . Hypertension   . Prostate cancer [facility])    prostate cancer     SURGICAL HISTORY: Past Surgical History:  Procedure Laterality Date  . LAPAROTOMY N/A 08/14/2019   Procedure: EXPLORATORY LAPAROTOMY EXCISION MESENTERIC MASS;  Surgeon: Robert Bellow, MD;  Location: [facility] ORS;  Service: General;  Laterality: N/A;  . LYMPHADENECTOMY Bilateral 02/04/2015   Procedure: BILATERAL PELVIC LYMPHADENECTOMY;  Surgeon: Raynelle Bring, MD;  Location: [facility];  Service: Urology;  Laterality: Bilateral;  . ROBOT ASSISTED LAPAROSCOPIC RADICAL PROSTATECTOMY N/A 02/04/2015   Procedure: XI ROBOTIC ASSISTED LAPAROSCOPIC RADICAL PROSTATECTOMY LEVEL 2;  Surgeon: Raynelle Bring, MD;  Location: [facility]S;  Service: Urology;  Laterality: N/A;    SOCIAL HISTORY: Social History   Socioeconomic History  . Marital status: Married    Spouse name: Not  on file  . Number of children: Not on file  . Years of education: Not on file  . Highest  education level: Not on file  Occupational History  . Not on file  Tobacco Use  . Smoking status: Never  . Smokeless tobacco: Never  Vaping Use  . Vaping Use: Never used  Substance and Sexual Activity  . Alcohol use: Yes    Comment: 2-4 ounces almost every day   . Drug use: No  . Sexual activity: Not on file  Other Topics Concern  . Not on file  Social History Narrative   Lives in [place]; Dealer; never smoked; whiskey every day.    Social Determinants of Health   Financial Resource Strain: Not on file  Food Insecurity: Not on file  Transportation Needs: Not on file  Physical Activity: Not on file  Stress: Not on file  Social Connections: Not on file  Intimate Partner Violence: Not on file    FAMILY HISTORY: Family History  Problem Relation Age of Onset  . Diabetes Mellitus II Other   . Hyperlipidemia Other   . Stroke Other   . Hypertension Other   . Kidney cancer Sister        survived  . Hypertension Father   . Prostate cancer Neg Hx   . Tuberculosis Neg Hx     ALLERGIES:  is allergic to atorvastatin.  MEDICATIONS:  Current Outpatient Medications  Medication Sig Dispense Refill  . aspirin EC 81 MG tablet Take 81 mg by mouth daily. Swallow whole.    . Calcium Carb-Cholecalciferol (CALCIUM 600 + D PO) Take 1 tablet by mouth 2 (two) times daily.    . famotidine (PEPCID) 40 MG tablet Take 1 tablet by mouth at bedtime.    . IVERMECTIN PO Take 12 mg by mouth in the morning and at bedtime.    . Liniments (DEEP BLUE RELIEF EX) Apply 1 application topically 2 (two) times daily as needed (muscle pain.).    Marland Kitchen lisinopril (PRINIVIL,ZESTRIL) 5 MG tablet TAKE 1 TABLET BY MOUTH DAILY (Patient taking differently: Take 5 mg by mouth daily.) 30 tablet 0  . Multiple Vitamin (MULTIVITAMIN) tablet Take 1 tablet by mouth daily.     No current facility-administered medications for this visit.      Marland Kitchen  PHYSICAL EXAMINATION: ECOG PERFORMANCE STATUS: 0 - Asymptomatic  Vitals:    09/09/21 0955  BP: 137/61  Pulse: (!) 56  Temp: 98.3 F (36.8 C)  SpO2: 97%   Filed Weights   09/09/21 0955  Weight: 175 lb 9.6 oz (79.7 kg)    Physical Exam Constitutional:      Comments: Accompanied by his wife.  He is walking independently.  HENT:     Head: Normocephalic and atraumatic.     Mouth/Throat:     Pharynx: No oropharyngeal exudate.  Eyes:     Pupils: Pupils are equal, round, and reactive to light.  Cardiovascular:     Rate and Rhythm: Normal rate and regular rhythm.  Pulmonary:     Effort: Pulmonary effort is normal. No respiratory distress.     Breath sounds: Normal breath sounds. No wheezing.  Abdominal:     General: Bowel sounds are normal. There is no distension.     Palpations: Abdomen is soft. There is no mass.     Tenderness: There is no abdominal tenderness. There is no guarding or rebound.  Musculoskeletal:        General: No tenderness. Normal range of motion.  Cervical back: Normal range of motion and neck supple.  Skin:    General: Skin is warm.  Neurological:     Mental Status: He is alert and oriented to person, place, and time.  Psychiatric:        Mood and Affect: Affect normal.     LABORATORY DATA:  I have reviewed the data as listed Lab Results  Component Value Date   WBC 6.9 02/22/2021   HGB 16.5 02/22/2021   HCT 49.1 02/22/2021   MCV 92.3 02/22/2021   PLT 259 02/22/2021   Recent Labs    02/15/21 0912 02/22/21 1019 09/07/21 1109  NA  --  135  --   K  --  3.9  --   CL  --  102  --   CO2  --  25  --   GLUCOSE  --  92  --   BUN  --  18  --   CREATININE 1.20 0.97 1.30*  CALCIUM  --  8.9  --   GFRNONAA  --  >60  --   PROT  --  7.0  --   ALBUMIN  --  4.1  --   AST  --  24  --   ALT  --  20  --   ALKPHOS  --  56  --   BILITOT  --  0.4  --     RADIOGRAPHIC STUDIES: I have personally reviewed the radiological images as listed and agreed with the findings in the report.  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  S/p mesenteric lymph node biopsy. JULY 2021-at Dx: 3.5cm ; CT FEB 2023- Increase in size of the dominant 4.6 x 3.9 cm lymph node in the right central mesentery. PET MARCH 2023-  Slight interval enlargement and slight increase in FDG uptake in the dominant right upper quadrant nodal lesion ([place] 5).  SEP 2023- Stable size of ileocolic mesenteric nodal mass. Additional index lymph nodes are unchanged in the interval No new or progressive disease identified.  #Patient s/p evaluation with radiation oncology in February, 2023. Pt declined [concerns with side effects/ slow growth of the disease in the last 2 years].  Discussed the rare but possible concern for bowel obstruction tumor does grow significantly-however repeat imaging in 6 months would be helpful.  Understands that if he has any symptoms he will reach out to [place] ASAP patient and wife in agreement.  # Prostate cancer-s/p RP [4287].  PSA normal-continue follow-up with urology [Dr.Borden; No fu].FEB 2023- PSA -WNL.    # DISPOSITION:  # follow up in 6 months- MD; labs- cbc/cmp;LDH; PSA; CT AP prior-..-Dr.B  # I reviewed the blood work- with the patient in detail; also reviewed the imaging independently [as summarized above]; and with the patient in detail.     All questions were answered. The patient knows to call the clinic with any problems, questions or concerns.    Cammie Sickle, MD 09/09/2021 12:27 PM

## 2021-09-09 NOTE — Progress Notes (Signed)
No concerns. 

## 2022-03-07 ENCOUNTER — Ambulatory Visit
Admission: RE | Admit: 2022-03-07 | Discharge: 2022-03-07 | Disposition: A | Discharge status: Home / Self Care | Payer: Medicare PPO | Source: Ambulatory Visit | Attending: Internal Medicine | Admitting: Internal Medicine

## 2022-03-07 DIAGNOSIS — C8213 Follicular lymphoma grade II, intra-abdominal lymph nodes: Secondary | ICD-10-CM

## 2022-03-07 MED ORDER — IOHEXOL 300 MG/ML  SOLN
100.0000 mL | Freq: Once | INTRAMUSCULAR | Status: AC | PRN
  Administered 2022-03-07: 100 mL via INTRAVENOUS

## 2022-03-14 ENCOUNTER — Inpatient Hospital Stay: Payer: Medicare PPO | Attending: Internal Medicine

## 2022-03-14 ENCOUNTER — Inpatient Hospital Stay (HOSPITAL_BASED_OUTPATIENT_CLINIC_OR_DEPARTMENT_OTHER): Payer: Medicare PPO | Admitting: Internal Medicine

## 2022-03-14 VITALS — BP 153/77 | HR 59 | Temp 97.7°F | Resp 16 | Wt 177.6 lb

## 2022-03-14 DIAGNOSIS — Z79899 Other long term (current) drug therapy: Secondary | ICD-10-CM | POA: Diagnosis not present

## 2022-03-14 DIAGNOSIS — Z7982 Long term (current) use of aspirin: Secondary | ICD-10-CM | POA: Diagnosis not present

## 2022-03-14 DIAGNOSIS — I10 Essential (primary) hypertension: Secondary | ICD-10-CM | POA: Insufficient documentation

## 2022-03-14 DIAGNOSIS — Z923 Personal history of irradiation: Secondary | ICD-10-CM | POA: Diagnosis not present

## 2022-03-14 DIAGNOSIS — C8203 Follicular lymphoma grade I, intra-abdominal lymph nodes: Secondary | ICD-10-CM | POA: Diagnosis not present

## 2022-03-14 DIAGNOSIS — Z8546 Personal history of malignant neoplasm of prostate: Secondary | ICD-10-CM | POA: Diagnosis present

## 2022-03-14 DIAGNOSIS — C8213 Follicular lymphoma grade II, intra-abdominal lymph nodes: Secondary | ICD-10-CM

## 2022-03-14 DIAGNOSIS — Z8572 Personal history of non-Hodgkin lymphomas: Secondary | ICD-10-CM | POA: Diagnosis present

## 2022-03-14 LAB — CBC WITH DIFFERENTIAL/PLATELET
Abs Immature Granulocytes: 0.01 10*3/uL (ref 0.00–0.07)
Basophils Absolute: 0.1 10*3/uL (ref 0.0–0.1)
Basophils Relative: 1 %
Eosinophils Absolute: 0.4 10*3/uL (ref 0.0–0.5)
Eosinophils Relative: 6 %
HCT: 51.9 % (ref 39.0–52.0)
Hemoglobin: 17.6 g/dL — ABNORMAL HIGH (ref 13.0–17.0)
Immature Granulocytes: 0 %
Lymphocytes Relative: 26 %
Lymphs Abs: 1.6 10*3/uL (ref 0.7–4.0)
MCH: 31.5 pg (ref 26.0–34.0)
MCHC: 33.9 g/dL (ref 30.0–36.0)
MCV: 93 fL (ref 80.0–100.0)
Monocytes Absolute: 0.7 10*3/uL (ref 0.1–1.0)
Monocytes Relative: 12 %
Neutro Abs: 3.6 10*3/uL (ref 1.7–7.7)
Neutrophils Relative %: 55 %
Platelets: 273 10*3/uL (ref 150–400)
RBC: 5.58 MIL/uL (ref 4.22–5.81)
RDW: 13 % (ref 11.5–15.5)
WBC: 6.4 10*3/uL (ref 4.0–10.5)
nRBC: 0 % (ref 0.0–0.2)

## 2022-03-14 LAB — COMPREHENSIVE METABOLIC PANEL
ALT: 20 U/L (ref 0–44)
AST: 25 U/L (ref 15–41)
Albumin: 4.1 g/dL (ref 3.5–5.0)
Alkaline Phosphatase: 65 U/L (ref 38–126)
Anion gap: 9 (ref 5–15)
BUN: 20 mg/dL (ref 8–23)
CO2: 27 mmol/L (ref 22–32)
Calcium: 9.3 mg/dL (ref 8.9–10.3)
Chloride: 99 mmol/L (ref 98–111)
Creatinine, Ser: 0.94 mg/dL (ref 0.61–1.24)
GFR, Estimated: >60 mL/min (ref >60)
Glucose, Bld: 98 mg/dL (ref 70–99)
Potassium: 4.3 mmol/L (ref 3.5–5.1)
Sodium: 135 mmol/L (ref 135–145)
Total Bilirubin: 0.9 mg/dL (ref 0.3–1.2)
Total Protein: 7.4 g/dL (ref 6.5–8.1)

## 2022-03-14 LAB — LACTATE DEHYDROGENASE: LDH: 136 U/L (ref 98–192)

## 2022-03-14 LAB — PSA: Prostatic Specific Antigen: <0.01 ng/mL (ref 0.00–4.00)

## 2022-03-14 NOTE — Progress Notes (Signed)
[facility] NOTE  Patient Care Team: Rolanda Jay, MD as PCP - General (Internal Medicine) Margaretha Sheffield, MD as Consulting Physician (Otolaryngology) Cammie Sickle, MD as Consulting Physician (Internal Medicine)  CHIEF COMPLAINTS/PURPOSE OF CONSULTATION: Lymphoma  #  Oncology History Overview Note  #Prostate cancer-s/p RP- Dr.Border; July 2020- non-detectable.  #June 2021-CT scan-3.5 cm mesenteric mass [incidental]; # July 30th 2021- CT-Biopsy- # DIAGNOSIS: A. 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  Accompanied by his wife.  Patrick Pena 72 y.o.  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   Patient denies any nausea vomiting.  No fevers or chills.  Denies any abdominal pain.  No new lumps or bumps.  Review of Systems  Constitutional:  Negative for chills, diaphoresis, fever, malaise/fatigue and weight loss.  HENT:  Negative for nosebleeds and sore throat.   Eyes:  Negative for double vision.  Respiratory:  Negative for cough, hemoptysis, sputum production, shortness of breath and wheezing.   Cardiovascular:  Negative for chest pain, palpitations, orthopnea and leg swelling.   Gastrointestinal:  Negative for abdominal pain, blood in stool, constipation, diarrhea, heartburn, melena, nausea and vomiting.  Genitourinary:  Negative for dysuria, frequency and urgency.  Musculoskeletal:  Positive for back pain and joint pain.  Skin: Negative.  Negative for itching and rash.  Neurological:  Negative for dizziness, tingling, focal weakness, weakness and headaches.  Endo/Heme/Allergies:  Does not bruise/bleed easily.  Psychiatric/Behavioral:  Negative for depression. The patient is not nervous/anxious and does not have insomnia.      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 03/13/2013   Fractured pelvis ([place]) 06/25/2019   from fall off of ladder   GERD (gastroesophageal reflux disease)    Hypercholesteremia    Hypercholesterolemia 03/13/2013   Hypertension    Prostate cancer [facility])    prostate cancer     SURGICAL HISTORY: Past Surgical History:  Procedure Laterality Date   LAPAROTOMY N/A 08/14/2019   Procedure: EXPLORATORY LAPAROTOMY EXCISION MESENTERIC MASS;  Surgeon: Robert Bellow, MD;  Location: [facility] ORS;  Service: General;  Laterality: N/A;   LYMPHADENECTOMY Bilateral 02/04/2015   Procedure: BILATERAL PELVIC LYMPHADENECTOMY;  Surgeon: Raynelle Bring, MD;  Location: [facility];  Service: Urology;  Laterality: Bilateral;   ROBOT ASSISTED LAPAROSCOPIC RADICAL PROSTATECTOMY N/A 02/04/2015   Procedure: XI ROBOTIC ASSISTED LAPAROSCOPIC RADICAL PROSTATECTOMY LEVEL 2;  Surgeon: Raynelle Bring, MD;  Location: [facility];  Service: Urology;  Laterality: N/A;    SOCIAL HISTORY: Social History   Socioeconomic History   Marital status: Married    Spouse name: Not on file  Number of children: Not on file   Years of education: Not on file   Highest education level: Not on file   Occupational History   Not on file  Tobacco Use   Smoking status: Never   Smokeless tobacco: Never  Vaping Use   Vaping Use: Never used  Substance and Sexual Activity   Alcohol use: Yes    Comment: 2-4 ounces almost every day    Drug use: No   Sexual activity: Not on file  Other Topics Concern   Not on file  Social History Narrative   Lives in [place]; Dealer; never smoked; whiskey every day.    Social Determinants of Health   Financial Resource Strain: Not on file  Food Insecurity: Not on file  Transportation Needs: Not on file  Physical Activity: Not on file  Stress: Not on file  Social Connections: Not on file  Intimate Partner Violence: Not on file    FAMILY HISTORY: Family History  Problem Relation Age of Onset   Diabetes Mellitus II Other    Hyperlipidemia Other    Stroke Other    Hypertension Other    Kidney cancer Sister        survived   Hypertension Father    Prostate cancer Neg Hx    Tuberculosis Neg Hx     ALLERGIES:  is allergic to atorvastatin.  MEDICATIONS:  Current Outpatient Medications  Medication Sig Dispense Refill   aspirin EC 81 MG tablet Take 81 mg by mouth daily. Swallow whole.     Calcium Carb-Cholecalciferol (CALCIUM 600 + D PO) Take 1 tablet by mouth 2 (two) times daily.     famotidine (PEPCID) 40 MG tablet Take 1 tablet by mouth at bedtime.     IVERMECTIN PO Take 12 mg by mouth in the morning and at bedtime.     Liniments (DEEP BLUE RELIEF EX) Apply 1 application topically 2 (two) times daily as needed (muscle pain.).     lisinopril (PRINIVIL,ZESTRIL) 5 MG tablet TAKE 1 TABLET BY MOUTH DAILY (Patient taking differently: Take 5 mg by mouth daily.) 30 tablet 0   Multiple Vitamin (MULTIVITAMIN) tablet Take 1 tablet by mouth daily.     No current facility-administered medications for this visit.      Marland Kitchen  PHYSICAL EXAMINATION: ECOG PERFORMANCE STATUS: 0 - Asymptomatic  Vitals:   03/14/22 1100  BP: (!) 153/77  Pulse: (!) 59   Resp: 16  Temp: 97.7 F (36.5 C)   Filed Weights   03/14/22 1100  Weight: 177 lb 9.6 oz (80.6 kg)   Physical Exam Constitutional:      Comments: Accompanied by his wife.  He is walking independently.  HENT:     Head: Normocephalic and atraumatic.     Mouth/Throat:     Pharynx: No oropharyngeal exudate.  Eyes:     Pupils: Pupils are equal, round, and reactive to light.  Cardiovascular:     Rate and Rhythm: Normal rate and regular rhythm.  Pulmonary:     Effort: Pulmonary effort is normal. No respiratory distress.     Breath sounds: Normal breath sounds. No wheezing.  Abdominal:     General: Bowel sounds are normal. There is no distension.     Palpations: Abdomen is soft. There is no mass.     Tenderness: There is no abdominal tenderness. There is no guarding or rebound.  Musculoskeletal:        General: No tenderness. Normal range of motion.  Cervical back: Normal range of motion and neck supple.  Skin:    General: Skin is warm.  Neurological:     Mental Status: He is alert and oriented to person, place, and time.  Psychiatric:        Mood and Affect: Affect normal.      LABORATORY DATA:  I have reviewed the data as listed Lab Results  Component Value Date   WBC 6.4 03/14/2022   HGB 17.6 (H) 03/14/2022   HCT 51.9 03/14/2022   MCV 93.0 03/14/2022   PLT 273 03/14/2022   Recent Labs    09/07/21 1109 03/14/22 1027  NA  --  135  K  --  4.3  CL  --  99  CO2  --  27  GLUCOSE  --  98  BUN  --  20  CREATININE 1.30* 0.94  CALCIUM  --  9.3  GFRNONAA  --  >60  PROT  --  7.4  ALBUMIN  --  4.1  AST  --  25  ALT  --  20  ALKPHOS  --  65  BILITOT  --  0.9    RADIOGRAPHIC STUDIES: I have personally reviewed the radiological images as listed and agreed with the findings in the report.  ASSESSMENT & PLAN:   Non-Hodgkin lymphoma of intra-abdominal lymph nodes ([place]) # FOLLICULAR LYMPHOMA-grade 1- 2.  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  S/p mesenteric lymph node biopsy. JULY  2021-at Dx: 3.5cm ; CT march 2024- 5.3 x 4.4 cm-Dominant mesenteric mass is slightly larger today compared to prior; additional mesenteric adenopathy noted [not enlarged by size criteria]; however, new additional there are 2 developing splenic lesions. The spleen itself is nonenlarged.   #Patient s/p evaluation with radiation oncology in February, 2023. Pt declined [concerns with side effects/ slow growth of the disease in the last 2 years].  Elevated with concerns of progression noted on the CT scan.  Recommend further evaluation with a PET scan/MRI abdomen.  Will review at tumor conference.  # Options for treatment include: Radiation versus systemic therapy like single agent Rituxan.   # Prostate cancer-s/p RP [id number].  PSA normal-continue follow-up with urology [Dr.Borden; No fu].FEB 2023- PSA -WNL.   #Incidental findings on Imaging  CT , CT scan 2024: Fatty liver diverticulosis I reviewed/discussed/ counseled the patient.    # DISPOSITION:  # follow up TBD--Dr.B  # I reviewed the blood work- with the patient in detail; also reviewed the imaging independently [as summarized above]; and with the patient in detail.    All questions were answered. The patient knows to call the clinic with any problems, questions or concerns.    Cammie Sickle, MD 03/14/2022 11:41 AM

## 2022-03-14 NOTE — Assessment & Plan Note (Addendum)
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  S/p mesenteric lymph node biopsy. JULY 2021-at Dx: 3.5cm ; CT march 2024- 5.3 x 4.4 cm-Dominant mesenteric mass is slightly larger today compared to prior; additional mesenteric adenopathy noted [not enlarged by size criteria]; however, new additional there are 2 developing splenic lesions. The spleen itself is nonenlarged.   #Patient s/p evaluation with radiation oncology in February, 2023. Pt declined [concerns with side effects/ slow growth of the disease in the last 2 years].  Elevated with concerns of progression noted on the CT scan.  Recommend further evaluation with a PET scan/MRI abdomen.  Will review at tumor conference.  # Options for treatment include: Radiation versus systemic therapy like single agent Rituxan.   # Prostate cancer-s/p RP [6837].  PSA normal-continue follow-up with urology [Dr.Borden; No fu].FEB 2023- PSA -WNL.   #Incidental findings on Imaging  CT , CT scan 2024: Fatty liver diverticulosis I reviewed/discussed/ counseled the patient.    # DISPOSITION:  # follow up TBD--Dr.B  # I reviewed the blood work- with the patient in detail; also reviewed the imaging independently [as summarized above]; and with the patient in detail.

## 2022-03-14 NOTE — Progress Notes (Signed)
Patient denies new problems/concerns today.   °

## 2022-03-16 ENCOUNTER — Telehealth: Payer: Self-pay | Admitting: Internal Medicine

## 2022-03-16 ENCOUNTER — Other Ambulatory Visit: Payer: Medicare PPO

## 2022-03-16 DIAGNOSIS — R161 Splenomegaly, not elsewhere classified: Secondary | ICD-10-CM

## 2022-03-16 NOTE — Telephone Encounter (Signed)
Discussed at tumor conference-recommend MRI of the abdomen for further evaluation of the splenic lesions; possibly hemangioma.  I tried to reach the patient.  Unable to reach the patient left a voicemail with above information.  MRI abdomen is ordered.  Please schedule follow-up in 2 to 3 days after the MRI is done. MD;  no labs. Thanks, GB

## 2022-03-30 ENCOUNTER — Ambulatory Visit
Admission: RE | Admit: 2022-03-30 | Discharge: 2022-03-30 | Disposition: A | Discharge status: Home / Self Care | Payer: Medicare PPO | Source: Ambulatory Visit | Attending: Internal Medicine | Admitting: Internal Medicine

## 2022-03-30 DIAGNOSIS — R161 Splenomegaly, not elsewhere classified: Secondary | ICD-10-CM | POA: Insufficient documentation

## 2022-03-30 MED ORDER — GADOBUTROL 1 MMOL/ML IV SOLN
7.5000 mL | Freq: Once | INTRAVENOUS | Status: AC | PRN
  Administered 2022-03-30: 7.5 mL via INTRAVENOUS

## 2022-04-05 ENCOUNTER — Encounter: Payer: Self-pay | Admitting: Internal Medicine

## 2022-04-05 ENCOUNTER — Inpatient Hospital Stay: Payer: Medicare PPO | Attending: Internal Medicine | Admitting: Internal Medicine

## 2022-04-05 VITALS — BP 152/71 | HR 61 | Temp 97.8°F | Resp 17 | Ht 67.0 in | Wt 178.0 lb

## 2022-04-05 DIAGNOSIS — Z7982 Long term (current) use of aspirin: Secondary | ICD-10-CM | POA: Diagnosis not present

## 2022-04-05 DIAGNOSIS — C8213 Follicular lymphoma grade II, intra-abdominal lymph nodes: Secondary | ICD-10-CM | POA: Diagnosis not present

## 2022-04-05 DIAGNOSIS — C8203 Follicular lymphoma grade I, intra-abdominal lymph nodes: Secondary | ICD-10-CM | POA: Insufficient documentation

## 2022-04-05 DIAGNOSIS — Z9221 Personal history of antineoplastic chemotherapy: Secondary | ICD-10-CM | POA: Insufficient documentation

## 2022-04-05 DIAGNOSIS — Z9079 Acquired absence of other genital organ(s): Secondary | ICD-10-CM | POA: Diagnosis not present

## 2022-04-05 DIAGNOSIS — I10 Essential (primary) hypertension: Secondary | ICD-10-CM | POA: Insufficient documentation

## 2022-04-05 DIAGNOSIS — Z79899 Other long term (current) drug therapy: Secondary | ICD-10-CM | POA: Insufficient documentation

## 2022-04-05 DIAGNOSIS — Z8546 Personal history of malignant neoplasm of prostate: Secondary | ICD-10-CM | POA: Diagnosis present

## 2022-04-05 NOTE — Assessment & Plan Note (Addendum)
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  S/p mesenteric lymph node biopsy. JULY 2021-at Dx: 3.5cm ; CT march 2024- 5.3 x 4.4 cm-Dominant mesenteric mass is slightly larger today compared to prior; additional mesenteric adenopathy noted [not enlarged by size criteria]; however, new additional there are 2 developing splenic lesions. The spleen itself is nonenlarged.  MRI-March 2024-similar dominant mesenteric mass; however spleen lesion still inconclusive.  Discussed the tumor conference.   # Given the increase in size of the tumor; and also splenic lesions on the indeterminate-recommend starting chemotherapy with Bendamustine rituximab q. monthly x 6 vs-rituximab weekly x 4 single agent.  Discussed the pros and cons of each option at length.  Discussed that in general response rates are in the range of 80% with combination chemotherapy; and the responses are deeper/and longer.  Side effects from the combination treatment are slightly higher compared to single agent rituximab.  Given the absence of any significant co-morbidities/and possible splenic lesions would consider combination chemotherapy for this patient.   #  I reviewed at length the individual components with chemotherapy; and the schedule in detail.  I also discussed the potential side effects including but not limited to-increasing fatigue, nausea vomiting, diarrhea,  sores in the mouth, increase risk of infection and also neuropathy.  Would recommend check for hepatitis workup prior to initiating treatment with rituximab based therapies.   # However at the end of discussion-patient states that he will want to wait for the next 6 months or so to start treatment.  Understand that he will likely need reimaging prior to next treatment.  # Prostate cancer-s/p RP   PSA normal-continue follow-up with urology [Dr.Borden; No fu].march 2024- PSA -WNL.   # Vaccinations prophylactic: Recommend shingles; and also pneumococcal vaccination in  anticipation of upcoming rituximab based therapy.    # DISPOSITION:  # follow up in 6 months- MD; labs- cbc/cmp; LDH-Dr.B  # I reviewed the blood work- with the patient in detail; also reviewed the imaging independently [as summarized above]; and with the patient in detail.

## 2022-04-05 NOTE — Progress Notes (Signed)
[place] NOTE  Patient Care Team: Rolanda Jay, MD as PCP - General (Internal Medicine) Margaretha Sheffield, MD as Consulting Physician (Otolaryngology) Cammie Sickle, MD as Consulting Physician (Internal Medicine)  CHIEF COMPLAINTS/PURPOSE OF CONSULTATION: Lymphoma  #  Oncology History Overview Note  #Prostate cancer-s/p RP- Dr.Borden; July 2020- non-detectable.  #June 2021-CT scan-3.5 cm mesenteric mass [incidental]; # July 30th 2021- CT-Biopsy- # DIAGNOSIS: A. 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  Accompanied by his wife.  Patrick Pena 72 y.o.  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  Patient denies any nausea vomiting.  No fevers or chills.  Denies any abdominal pain.  No new lumps or bumps.  Review of Systems  Constitutional:  Negative for chills, diaphoresis, fever, malaise/fatigue and weight loss.  HENT:  Negative for nosebleeds and sore throat.   Eyes:  Negative for double vision.  Respiratory:  Negative for cough, hemoptysis, sputum production, shortness of breath and wheezing.   Cardiovascular:  Negative for chest pain, palpitations, orthopnea and leg  swelling.  Gastrointestinal:  Negative for abdominal pain, blood in stool, constipation, diarrhea, heartburn, melena, nausea and vomiting.  Genitourinary:  Negative for dysuria, frequency and urgency.  Musculoskeletal:  Positive for back pain and joint pain.  Skin: Negative.  Negative for itching and rash.  Neurological:  Negative for dizziness, tingling, focal weakness, weakness and headaches.  Endo/Heme/Allergies:  Does not bruise/bleed easily.  Psychiatric/Behavioral:  Negative for depression. The patient is not nervous/anxious and does not have insomnia.      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 03/13/2013   Fractured pelvis 06/25/2019   from fall off of ladder   GERD (gastroesophageal reflux disease)    Hypercholesteremia    Hypercholesterolemia 03/13/2013   Hypertension    Prostate cancer    prostate cancer     SURGICAL HISTORY: Past Surgical History:  Procedure Laterality Date   LAPAROTOMY N/A 08/14/2019   Procedure: EXPLORATORY LAPAROTOMY EXCISION MESENTERIC MASS;  Surgeon: Robert Bellow, MD;  Location: [facility] ORS;  Service: General;  Laterality: N/A;   LYMPHADENECTOMY Bilateral 02/04/2015   Procedure: BILATERAL PELVIC LYMPHADENECTOMY;  Surgeon: Raynelle Bring, MD;  Location: [facility];  Service: Urology;  Laterality: Bilateral;   ROBOT ASSISTED LAPAROSCOPIC RADICAL PROSTATECTOMY N/A 02/04/2015   Procedure: XI ROBOTIC ASSISTED LAPAROSCOPIC RADICAL PROSTATECTOMY LEVEL 2;  Surgeon: Raynelle Bring, MD;  Location: [facility];  Service: Urology;  Laterality: N/A;    SOCIAL HISTORY: Social History   Socioeconomic History   Marital status: Married    Spouse name: Not on file  Number of children: Not on file   Years of education: Not on file   Highest education level: Not on file   Occupational History   Not on file  Tobacco Use   Smoking status: Never   Smokeless tobacco: Never  Vaping Use   Vaping Use: Never used  Substance and Sexual Activity   Alcohol use: Yes    Comment: 2-4 ounces almost every day    Drug use: No   Sexual activity: Not on file  Other Topics Concern   Not on file  Social History Narrative   Lives in [place]; Dealer; never smoked; whiskey every day.    Social Determinants of Health   Financial Resource Strain: Not on file  Food Insecurity: Not on file  Transportation Needs: Not on file  Physical Activity: Not on file  Stress: Not on file  Social Connections: Not on file  Intimate Partner Violence: Not on file    FAMILY HISTORY: Family History  Problem Relation Age of Onset   Diabetes Mellitus II Other    Hyperlipidemia Other    Stroke Other    Hypertension Other    Kidney cancer Sister        survived   Hypertension Father    Prostate cancer Neg Hx    Tuberculosis Neg Hx     ALLERGIES:  is allergic to atorvastatin.  MEDICATIONS:  Current Outpatient Medications  Medication Sig Dispense Refill   aspirin EC 81 MG tablet Take 81 mg by mouth daily. Swallow whole.     Calcium Carb-Cholecalciferol (CALCIUM 600 + D PO) Take 1 tablet by mouth 2 (two) times daily.     famotidine (PEPCID) 40 MG tablet Take 1 tablet by mouth at bedtime.     IVERMECTIN PO Take 12 mg by mouth in the morning and at bedtime.     Liniments (DEEP BLUE RELIEF EX) Apply 1 application topically 2 (two) times daily as needed (muscle pain.).     lisinopril (PRINIVIL,ZESTRIL) 5 MG tablet TAKE 1 TABLET BY MOUTH DAILY (Patient taking differently: Take 5 mg by mouth daily.) 30 tablet 0   Multiple Vitamin (MULTIVITAMIN) tablet Take 1 tablet by mouth daily.     No current facility-administered medications for this visit.      Marland Kitchen  PHYSICAL EXAMINATION: ECOG PERFORMANCE STATUS: 0 - Asymptomatic  Vitals:   04/05/22 1003  BP: (!) 152/71  Pulse: 61   Resp: 17  Temp: 97.8 F (36.6 C)  SpO2: 97%    Filed Weights   04/05/22 1003  Weight: 178 lb (80.7 kg)    Physical Exam Constitutional:      Comments: Accompanied by his wife.  He is walking independently.  HENT:     Head: Normocephalic and atraumatic.     Mouth/Throat:     Pharynx: No oropharyngeal exudate.  Eyes:     Pupils: Pupils are equal, round, and reactive to light.  Cardiovascular:     Rate and Rhythm: Normal rate and regular rhythm.  Pulmonary:     Effort: Pulmonary effort is normal. No respiratory distress.     Breath sounds: Normal breath sounds. No wheezing.  Abdominal:     General: Bowel sounds are normal. There is no distension.     Palpations: Abdomen is soft. There is no mass.     Tenderness: There is no abdominal tenderness. There is no guarding or rebound.  Musculoskeletal:        General: No tenderness. Normal range of motion.  Cervical back: Normal range of motion and neck supple.  Skin:    General: Skin is warm.  Neurological:     Mental Status: He is alert and oriented to person, place, and time.  Psychiatric:        Mood and Affect: Affect normal.      LABORATORY DATA:  I have reviewed the data as listed Lab Results  Component Value Date   WBC 6.4 03/14/2022   HGB 17.6 (H) 03/14/2022   HCT 51.9 03/14/2022   MCV 93.0 03/14/2022   PLT 273 03/14/2022   Recent Labs    09/07/21 1109 03/14/22 1027  NA  --  135  K  --  4.3  CL  --  99  CO2  --  27  GLUCOSE  --  98  BUN  --  20  CREATININE 1.30* 0.94  CALCIUM  --  9.3  GFRNONAA  --  >60  PROT  --  7.4  ALBUMIN  --  4.1  AST  --  25  ALT  --  20  ALKPHOS  --  65  BILITOT  --  0.9    RADIOGRAPHIC STUDIES: I have personally reviewed the radiological images as listed and agreed with the findings in the report.  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  S/p mesenteric lymph node  biopsy. JULY 2021-at Dx: 3.5cm ; CT march 2024- 5.3 x 4.4 cm-Dominant mesenteric mass is slightly larger today compared to prior; additional mesenteric adenopathy noted [not enlarged by size criteria]; however, new additional there are 2 developing splenic lesions. The spleen itself is nonenlarged.  MRI-March 2024-similar dominant mesenteric mass; however spleen lesion still inconclusive.  Discussed the tumor conference.   # Given the increase in size of the tumor; and also splenic lesions on the indeterminate-recommend starting chemotherapy with Bendamustine rituximab q. monthly x 6 vs-rituximab weekly x 4 single agent.  Discussed the pros and cons of each option at length.  Discussed that in general response rates are in the range of 80% with combination chemotherapy; and the responses are deeper/and longer.  Side effects from the combination treatment are slightly higher compared to single agent rituximab.  Given the absence of any significant co-morbidities/and possible splenic lesions would consider combination chemotherapy for this patient.   #  I reviewed at length the individual components with chemotherapy; and the schedule in detail.  I also discussed the potential side effects including but not limited to-increasing fatigue, nausea vomiting, diarrhea,  sores in the mouth, increase risk of infection and also neuropathy.  Would recommend check for hepatitis workup prior to initiating treatment with rituximab based therapies.   # However at the end of discussion-patient states that he will want to wait for the next 6 months or so to start treatment.  Understand that he will likely need reimaging prior to next treatment.  # Prostate cancer-s/p RP   PSA normal-continue follow-up with urology [Dr.Borden; No fu].march 2024- PSA -WNL.   # Vaccinations prophylactic: Recommend shingles; and also pneumococcal vaccination in anticipation of upcoming rituximab based therapy.    # DISPOSITION:  # follow up  in 6 months- MD; labs- cbc/cmp; LDH-Dr.B  # I reviewed the blood work- with the patient in detail; also reviewed the imaging independently [as summarized above]; and with the patient in detail.  All questions were answered. The patient knows to call the clinic with any problems,  questions or concerns.    Cammie Sickle, MD 04/05/2022 12:20 PM

## 2022-04-05 NOTE — Progress Notes (Signed)
START ON PATHWAY REGIMEN - Lymphoma and CLL     A cycle is every 28 days:     Rituximab-xxxx      Bendamustine   **Always confirm dose/schedule in your pharmacy ordering system**  Patient Characteristics: Follicular Lymphoma, Grades 1, 2, and 3A, First Line, Stage III / IV, Symptomatic or Bulky Disease Disease Type: Follicular Lymphoma, Grade 1, 2, or 3A Disease Type: Not Applicable Disease Type: Not Applicable Line of Therapy: First Line Disease Characteristics: Symptomatic or Bulky Disease Intent of Therapy: Non-Curative / Palliative Intent, Discussed with Patient 

## 2022-04-05 NOTE — Progress Notes (Signed)
Patient has no concerns today. 

## 2022-04-06 ENCOUNTER — Other Ambulatory Visit: Payer: Self-pay

## 2022-04-20 IMAGING — CT CT CHEST W/ CM
2 of 4 series · 13 of 36 positions shown, 16 images · IV contrast (omnipaque)
Comparison: PET-CT 07/03/2019

CLINICAL DATA: Non-Hodgkin's lymphoma. Assess response to
treatment. Follicular lymphoma.

EXAM:
CT CHEST, ABDOMEN, AND PELVIS WITH CONTRAST
TECHNIQUE: Multidetector CT imaging of the chest, abdomen and pelvis was
performed following the standard protocol during bolus
administration of intravenous contrast.
CONTRAST:  100mL OMNIPAQUE IOHEXOL 300 MG/ML  SOLN

[Series 2: axials cap 5.00 · axial · 0.81mm/px · z∈[-1512,-947]mm · 10 of 139 slices shown, 13 images]
[im 13/139  mediastinal]
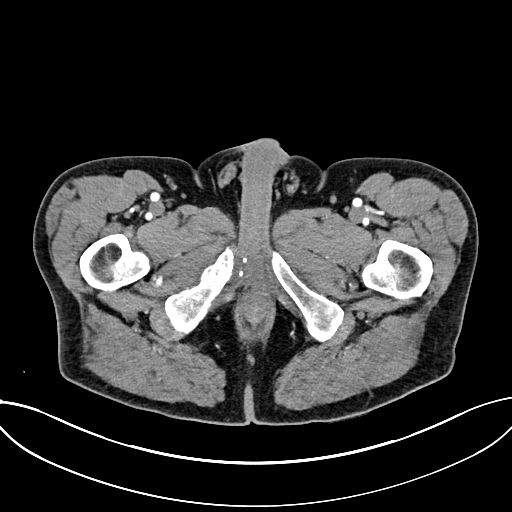
[im 13/139  lung]
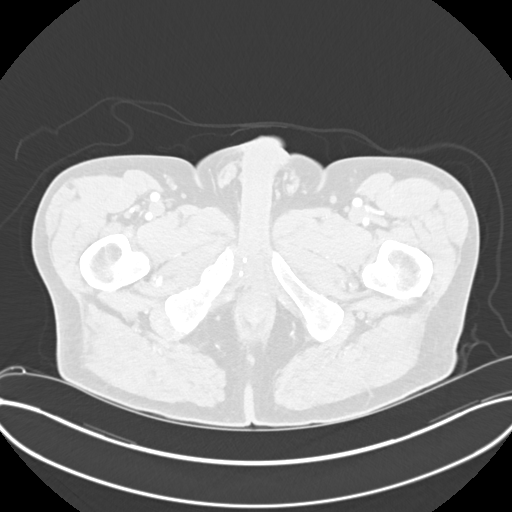
[im 26/139  lung]
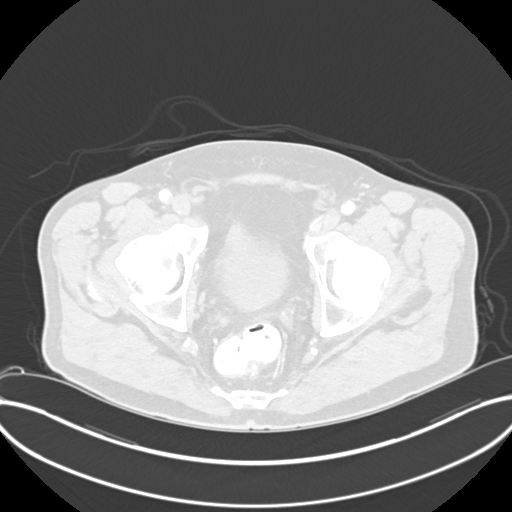
[im 38/139  lung]
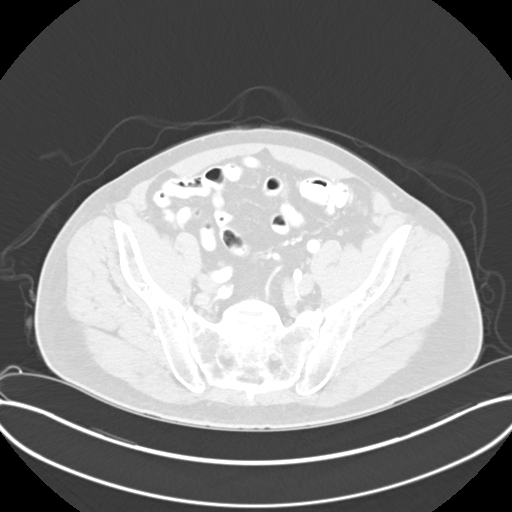
[im 51/139  lung]
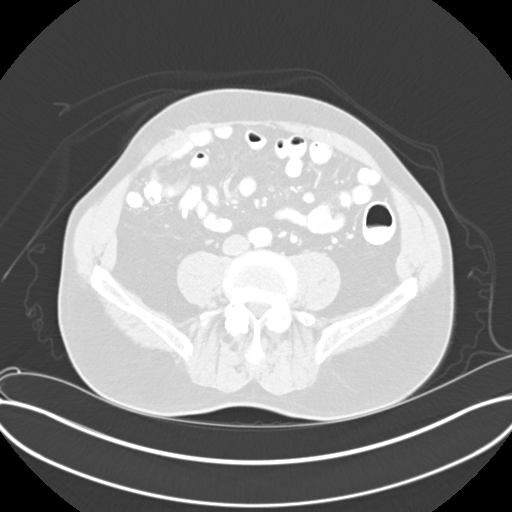
[im 63/139  mediastinal]
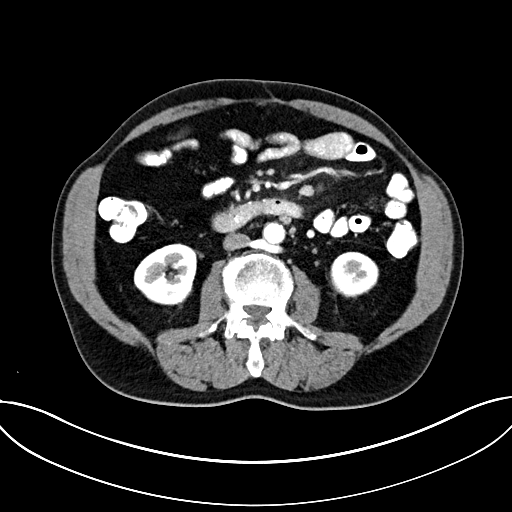
[im 63/139  lung]
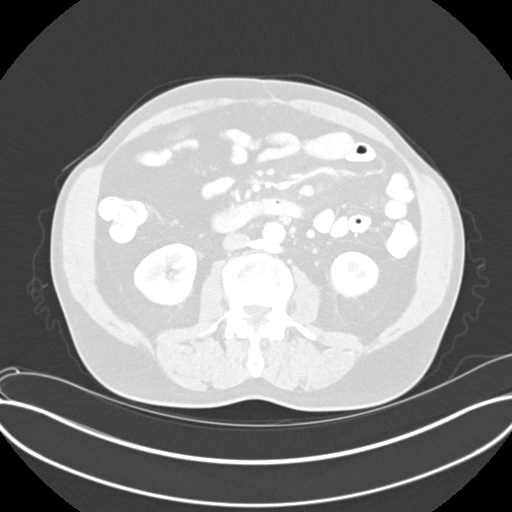
[im 76/139  lung]
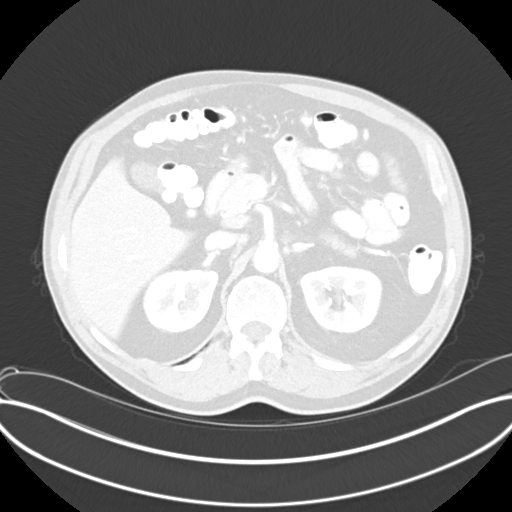
[im 88/139  lung]
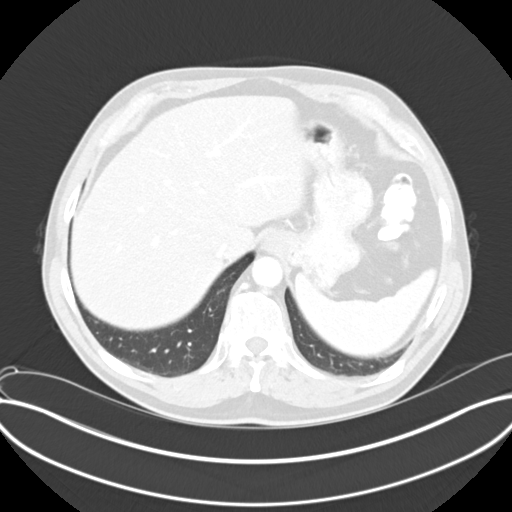
[im 101/139  lung]
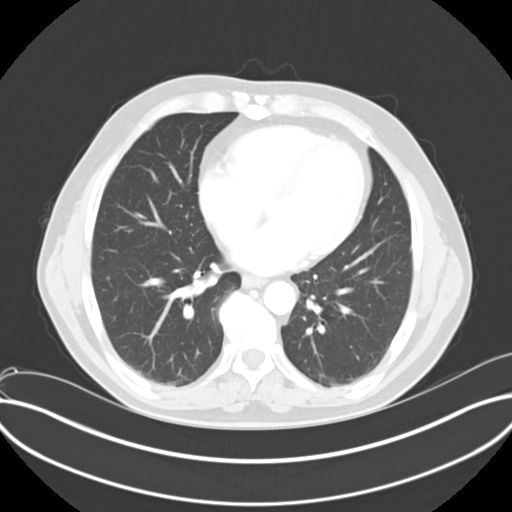
[im 113/139  mediastinal]
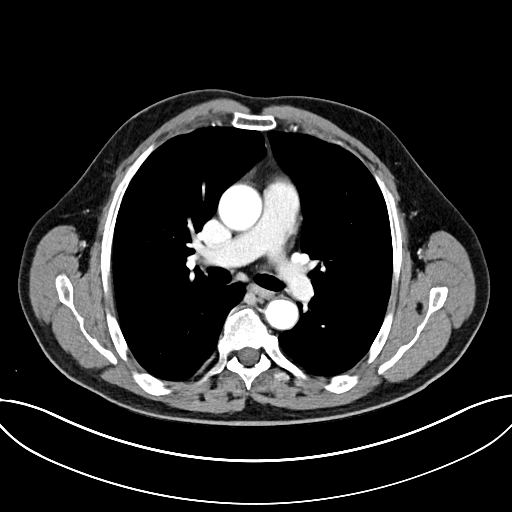
[im 113/139  lung]
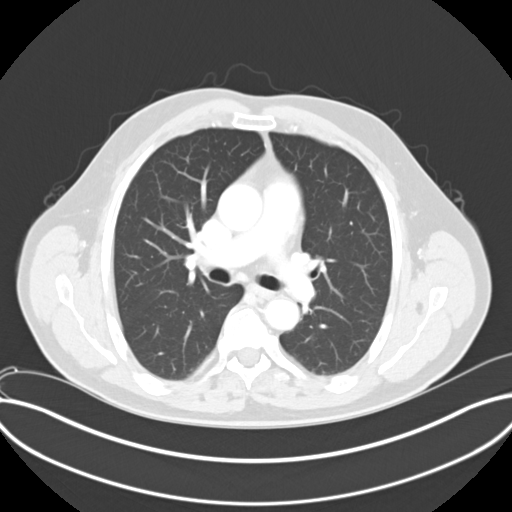
[im 126/139  lung]
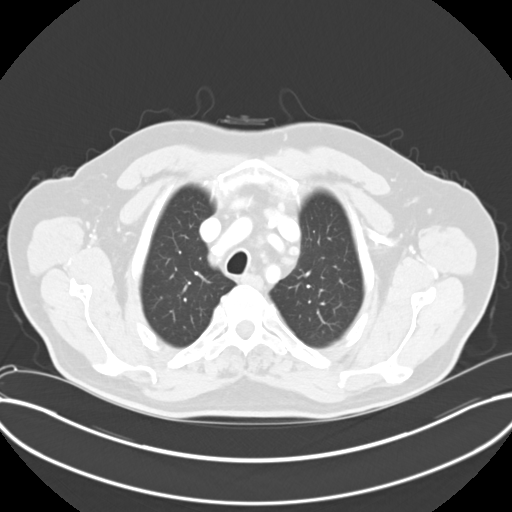

[Series 4: coronals cap 2.00 cor · coronal · 0.80mm/px · 3 of 146 slices shown]
[im 30/146  lung]
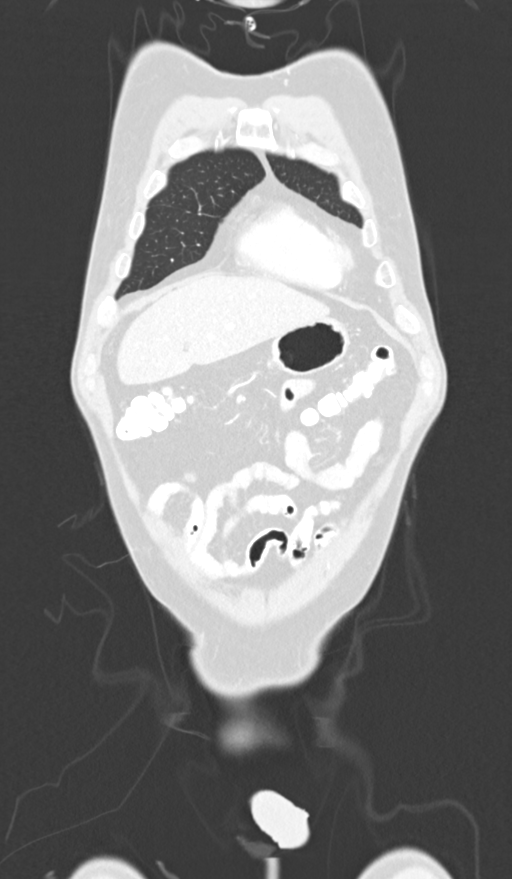
[im 59/146  lung]
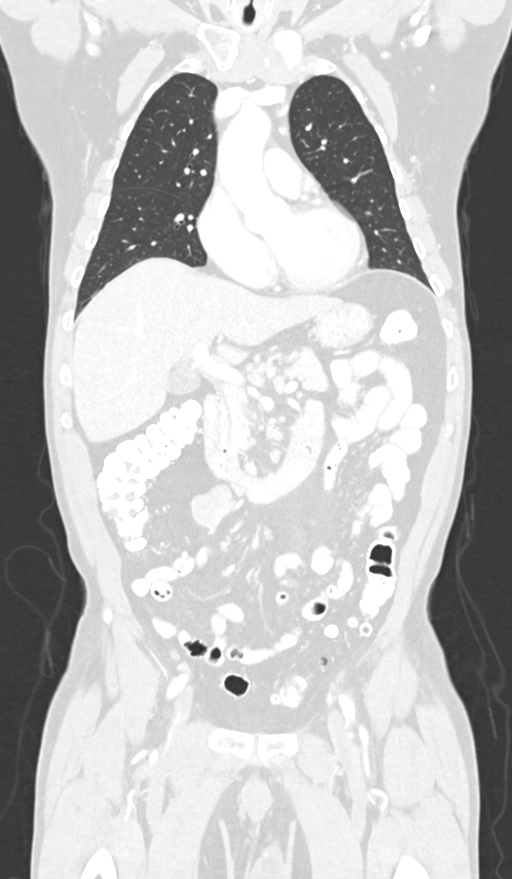
[im 88/146  lung]
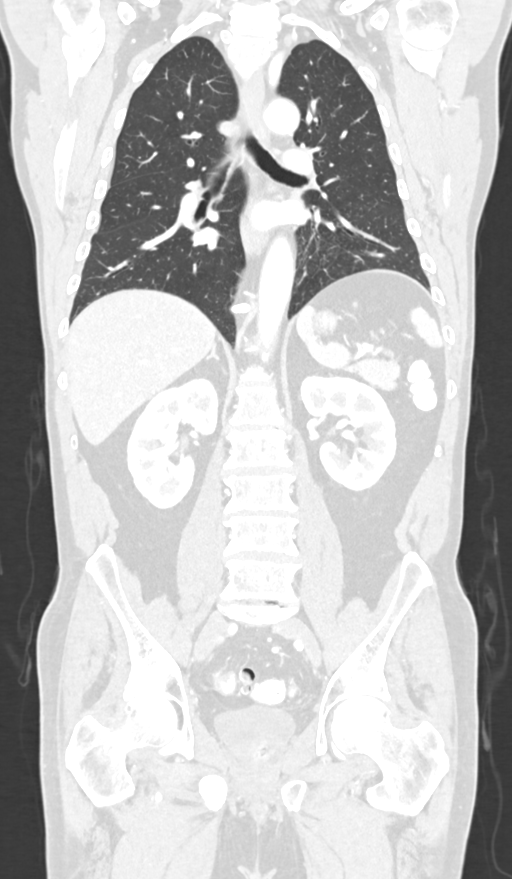

[13 of 36 positions shown; findings below may reference images not displayed]

FINDINGS: CT CHEST FINDINGS

Cardiovascular: No significant vascular findings. Normal heart size.
No pericardial effusion.

Mediastinum/Nodes: No axillary or supraclavicular adenopathy. Stable
enlargement of a LEFT thyroid nodule. No mediastinal
lymphadenopathy. Esophagus normal.

Lungs/Pleura: No new or suspicious pulmonary nodules. Airways normal

Musculoskeletal: No aggressive osseous lesion.

CT ABDOMEN AND PELVIS FINDINGS

Hepatobiliary: No focal hepatic lesion. No biliary ductal
dilatation. Gallbladder is normal. Common bile duct is normal.

Pancreas: Pancreas is normal. No ductal dilatation. No pancreatic
inflammation.

Spleen: Normal spleen

Adrenals/urinary tract: Adrenal glands and kidneys are normal. The
ureters and bladder normal.

Stomach/Bowel: Stomach, small bowel, appendix, and cecum are normal.
The colon and rectosigmoid colon are normal.

Vascular/Lymphatic: Abdominal aorta normal caliber. No
retroperitoneal lymphadenopathy.

Enlarged lymph nodes within the central mesentery again noted.
Dominant node in the RIGHT central abdomen measures 34 x 30 mm
(image 81/2) compared to 31 x 35 mm on PET-CT exam.

Smaller mesenteric nodes along the SMA also unchanged. For example
11 mm short axis node on image 73/2 compares to 7 mm. 9 mm node on
image 83/2 compares to 10 mm.

No new adenopathy in the mesentery

No inguinal or iliac adenopathy.

Reproductive: Prostate small.  Bilateral hydroceles.

Other: No free fluid

Musculoskeletal: Remote RIGHT inferior pubic ramus fracture.
Aggressive osseous lesion.
IMPRESSION: Chest Impression:

1. No lymphadenopathy in the thorax.
2. No pulmonary lymphoma.
3. Stable LEFT thyroid nodule.

Abdomen / Pelvis Impression:

1. Stable mesenteric lymphadenopathy. Dominant node in the RIGHT
abdomen unchanged. Smaller nodes also unchanged. No progression or
significant improvement.
2. No new adenopathy in the abdomen pelvis.
3. Normal volume spleen.
4. No skeletal metastasis identified

## 2022-06-09 ENCOUNTER — Encounter: Payer: Self-pay | Admitting: Internal Medicine

## 2022-06-09 NOTE — Telephone Encounter (Signed)
x

## 2022-06-28 ENCOUNTER — Other Ambulatory Visit: Payer: Self-pay

## 2022-08-04 DIAGNOSIS — C8290 Follicular lymphoma, unspecified, unspecified site: Secondary | ICD-10-CM | POA: Diagnosis not present

## 2022-10-05 ENCOUNTER — Inpatient Hospital Stay (HOSPITAL_BASED_OUTPATIENT_CLINIC_OR_DEPARTMENT_OTHER): Payer: Medicare PPO | Admitting: Internal Medicine

## 2022-10-05 ENCOUNTER — Encounter: Payer: Self-pay | Admitting: Internal Medicine

## 2022-10-05 ENCOUNTER — Other Ambulatory Visit: Payer: Self-pay

## 2022-10-05 ENCOUNTER — Inpatient Hospital Stay: Payer: Medicare PPO | Attending: Internal Medicine

## 2022-10-05 VITALS — BP 119/67 | HR 58 | Temp 98.6°F | Resp 17 | Ht 67.0 in | Wt 171.8 lb

## 2022-10-05 DIAGNOSIS — Z79899 Other long term (current) drug therapy: Secondary | ICD-10-CM | POA: Diagnosis not present

## 2022-10-05 DIAGNOSIS — C8593 Non-Hodgkin lymphoma, unspecified, intra-abdominal lymph nodes: Secondary | ICD-10-CM | POA: Diagnosis not present

## 2022-10-05 DIAGNOSIS — C61 Malignant neoplasm of prostate: Secondary | ICD-10-CM

## 2022-10-05 DIAGNOSIS — M1711 Unilateral primary osteoarthritis, right knee: Secondary | ICD-10-CM | POA: Diagnosis not present

## 2022-10-05 DIAGNOSIS — Z7982 Long term (current) use of aspirin: Secondary | ICD-10-CM | POA: Insufficient documentation

## 2022-10-05 DIAGNOSIS — Z9221 Personal history of antineoplastic chemotherapy: Secondary | ICD-10-CM | POA: Insufficient documentation

## 2022-10-05 DIAGNOSIS — Z8546 Personal history of malignant neoplasm of prostate: Secondary | ICD-10-CM | POA: Diagnosis not present

## 2022-10-05 DIAGNOSIS — C8213 Follicular lymphoma grade II, intra-abdominal lymph nodes: Secondary | ICD-10-CM

## 2022-10-05 LAB — CMP (CANCER CENTER ONLY)
ALT: 19 U/L (ref 0–44)
AST: 23 U/L (ref 15–41)
Albumin: 4 g/dL (ref 3.5–5.0)
Alkaline Phosphatase: 65 U/L (ref 38–126)
Anion gap: 7 (ref 5–15)
BUN: 18 mg/dL (ref 8–23)
CO2: 25 mmol/L (ref 22–32)
Calcium: 8.9 mg/dL (ref 8.9–10.3)
Chloride: 99 mmol/L (ref 98–111)
Creatinine: 0.95 mg/dL (ref 0.61–1.24)
GFR, Estimated: >60 mL/min (ref >60)
Glucose, Bld: 100 mg/dL — ABNORMAL HIGH (ref 70–99)
Potassium: 4.2 mmol/L (ref 3.5–5.1)
Sodium: 131 mmol/L — ABNORMAL LOW (ref 135–145)
Total Bilirubin: 1.1 mg/dL (ref 0.3–1.2)
Total Protein: 6.9 g/dL (ref 6.5–8.1)

## 2022-10-05 LAB — CBC WITH DIFFERENTIAL (CANCER CENTER ONLY)
Abs Immature Granulocytes: 0.01 10*3/uL (ref 0.00–0.07)
Basophils Absolute: 0.1 10*3/uL (ref 0.0–0.1)
Basophils Relative: 1 %
Eosinophils Absolute: 0.4 10*3/uL (ref 0.0–0.5)
Eosinophils Relative: 7 %
HCT: 52.4 % — ABNORMAL HIGH (ref 39.0–52.0)
Hemoglobin: 17.5 g/dL — ABNORMAL HIGH (ref 13.0–17.0)
Immature Granulocytes: 0 %
Lymphocytes Relative: 27 %
Lymphs Abs: 1.4 10*3/uL (ref 0.7–4.0)
MCH: 31.7 pg (ref 26.0–34.0)
MCHC: 33.4 g/dL (ref 30.0–36.0)
MCV: 94.9 fL (ref 80.0–100.0)
Monocytes Absolute: 0.7 10*3/uL (ref 0.1–1.0)
Monocytes Relative: 13 %
Neutro Abs: 2.7 10*3/uL (ref 1.7–7.7)
Neutrophils Relative %: 52 %
Platelet Count: 255 10*3/uL (ref 150–400)
RBC: 5.52 MIL/uL (ref 4.22–5.81)
RDW: 13 % (ref 11.5–15.5)
WBC Count: 5.2 10*3/uL (ref 4.0–10.5)
nRBC: 0 % (ref 0.0–0.2)

## 2022-10-05 LAB — HEPATITIS B CORE ANTIBODY, IGM: Hep B C IgM: NONREACTIVE

## 2022-10-05 LAB — LACTATE DEHYDROGENASE: LDH: 135 U/L (ref 98–192)

## 2022-10-05 LAB — PSA: Prostatic Specific Antigen: <0.01 ng/mL (ref 0.00–4.00)

## 2022-10-05 LAB — HEPATITIS B SURFACE ANTIGEN: Hepatitis B Surface Ag: NONREACTIVE

## 2022-10-05 LAB — HEPATITIS C ANTIBODY: HCV Ab: NONREACTIVE

## 2022-10-05 NOTE — Progress Notes (Signed)
[facility] CONSULT NOTE  Patient Care Team: Bary Leriche, MD as PCP - General (Internal Medicine) Vernie Murders, MD as Consulting Physician (Otolaryngology) Earna Coder, MD as Consulting Physician (Internal Medicine)  CHIEF COMPLAINTS/PURPOSE OF CONSULTATION: Lymphoma  #  Oncology History Overview Note  #Prostate cancer-s/p RP- Dr.Borden; July 2020- non-detectable.  #June 2021-CT scan-3.5 cm mesenteric mass [incidental]; # July 30th 2021- CT-Biopsy- # DIAGNOSIS: A. 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  Accompanied by his wife.  Salvadore Dom 72 y.o.  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  Pt has no complaints today. 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 Arthritis in R knee, chronic aching. Denies night sweats or fever. Good appetite. No swollen lymph node   Patient denies any nausea vomiting.  No fevers or chills.  Denies any abdominal pain.  No new lumps or bumps.  Review of Systems  Constitutional:  Negative for chills, diaphoresis, fever, malaise/fatigue and weight loss.  HENT:  Negative for nosebleeds and sore throat.   Eyes:  Negative for double  vision.  Respiratory:  Negative for cough, hemoptysis, sputum production, shortness of breath and wheezing.   Cardiovascular:  Negative for chest pain, palpitations, orthopnea and leg swelling.  Gastrointestinal:  Negative for abdominal pain, blood in stool, constipation, diarrhea, heartburn, melena, nausea and vomiting.  Genitourinary:  Negative for dysuria, frequency and urgency.  Musculoskeletal:  Positive for back pain and joint pain.  Skin: Negative.  Negative for itching and rash.  Neurological:  Negative for dizziness, tingling, focal weakness, weakness and headaches.  Endo/Heme/Allergies:  Does not bruise/bleed easily.  Psychiatric/Behavioral:  Negative for depression. The patient is not nervous/anxious and does not have insomnia.      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 03/13/2013   Fractured pelvis ([facility]) 06/25/2019   from fall off of ladder   GERD (gastroesophageal reflux disease)    Hypercholesteremia    Hypercholesterolemia 03/13/2013   Hypertension    Prostate cancer [facility])    prostate cancer     SURGICAL HISTORY: Past Surgical History:  Procedure Laterality Date   LAPAROTOMY N/A 08/14/2019   Procedure: EXPLORATORY LAPAROTOMY EXCISION MESENTERIC MASS;  Surgeon: Earline Mayotte, MD;  Location: [facility] ORS;  Service: General;  Laterality: N/A;   LYMPHADENECTOMY Bilateral 02/04/2015   Procedure: BILATERAL PELVIC LYMPHADENECTOMY;  Surgeon: Heloise Purpura, MD;  Location: [facility];  Service: Urology;  Laterality: Bilateral;   ROBOT ASSISTED LAPAROSCOPIC RADICAL PROSTATECTOMY N/A 02/04/2015   Procedure: XI ROBOTIC ASSISTED LAPAROSCOPIC RADICAL PROSTATECTOMY LEVEL 2;  Surgeon: Konrad Dolores  Laverle Patter, MD;  Location: [facility];  Service: Urology;  Laterality: N/A;    SOCIAL HISTORY: Social  History   Socioeconomic History   Marital status: Married    Spouse name: Not on file   Number of children: Not on file   Years of education: Not on file   Highest education level: Not on file  Occupational History   Not on file  Tobacco Use   Smoking status: Never   Smokeless tobacco: Never  Vaping Use   Vaping status: Never Used  Substance and Sexual Activity   Alcohol use: Yes    Comment: 2-4 ounces almost every day    Drug use: No   Sexual activity: Not on file  Other Topics Concern   Not on file  Social History Narrative   Lives in [place]; Curator; never smoked; whiskey every day.    Social Determinants of Health   Financial Resource Strain: Not on file  Food Insecurity: Not on file  Transportation Needs: Not on file  Physical Activity: Not on file  Stress: Not on file  Social Connections: Not on file  Intimate Partner Violence: Not on file    FAMILY HISTORY: Family History  Problem Relation Age of Onset   Diabetes Mellitus II Other    Hyperlipidemia Other    Stroke Other    Hypertension Other    Kidney cancer Sister        survived   Hypertension Father    Prostate cancer Neg Hx    Tuberculosis Neg Hx     ALLERGIES:  is allergic to atorvastatin.  MEDICATIONS:  Current Outpatient Medications  Medication Sig Dispense Refill   aspirin EC 81 MG tablet Take 81 mg by mouth daily. Swallow whole.     Calcium Carb-Cholecalciferol (CALCIUM 600 + D PO) Take 1 tablet by mouth 2 (two) times daily.     IVERMECTIN PO Take 12 mg by mouth in the morning and at bedtime.     Liniments (DEEP BLUE RELIEF EX) Apply 1 application topically 2 (two) times daily as needed (muscle pain.).     lisinopril (PRINIVIL,ZESTRIL) 5 MG tablet TAKE 1 TABLET BY MOUTH DAILY (Patient taking differently: Take 5 mg by mouth daily.) 30 tablet 0   Multiple Vitamin (MULTIVITAMIN) tablet Take 1 tablet by mouth daily.     Naltrexone HCl, Pain, 4.5 MG CAPS Take 1 Dose by mouth daily.      famotidine (PEPCID) 40 MG tablet Take 1 tablet by mouth at bedtime. (Patient not taking: Reported on 10/05/2022)     No current facility-administered medications for this visit.      Marland Kitchen  PHYSICAL EXAMINATION: ECOG PERFORMANCE STATUS: 0 - Asymptomatic  Vitals:   10/05/22 0945  BP: 119/67  Pulse: (!) 58  Resp: 17  Temp: 98.6 F (37 C)  SpO2: 98%    Filed Weights   10/05/22 0945  Weight: 171 lb 12.8 oz (77.9 kg)    Physical Exam Constitutional:      Comments: Accompanied by his wife.  He is walking independently.  HENT:     Head: Normocephalic and atraumatic.     Mouth/Throat:     Pharynx: No oropharyngeal exudate.  Eyes:     Pupils: Pupils are equal, round, and reactive to light.  Cardiovascular:     Rate and Rhythm: Normal rate and regular rhythm.  Pulmonary:     Effort: Pulmonary effort is normal. No respiratory distress.     Breath sounds: Normal breath sounds. No  wheezing.  Abdominal:     General: Bowel sounds are normal. There is no distension.     Palpations: Abdomen is soft. There is no mass.     Tenderness: There is no abdominal tenderness. There is no guarding or rebound.  Musculoskeletal:        General: No tenderness. Normal range of motion.     Cervical back: Normal range of motion and neck supple.  Skin:    General: Skin is warm.  Neurological:     Mental Status: He is alert and oriented to person, place, and time.  Psychiatric:        Mood and Affect: Affect normal.      LABORATORY DATA:  I have reviewed the data as listed Lab Results  Component Value Date   WBC 5.2 10/05/2022   HGB 17.5 (H) 10/05/2022   HCT 52.4 (H) 10/05/2022   MCV 94.9 10/05/2022   PLT 255 10/05/2022   Recent Labs    03/14/22 1027 10/05/22 0912  NA 135 131*  K 4.3 4.2  CL 99 99  CO2 27 25  GLUCOSE 98 100*  BUN 20 18  CREATININE 0.94 0.95  CALCIUM 9.3 8.9  GFRNONAA >60 >60  PROT 7.4 6.9  ALBUMIN 4.1 4.0  AST 25 23  ALT 20 19  ALKPHOS 65 65  BILITOT 0.9  1.1    RADIOGRAPHIC STUDIES: I have personally reviewed the radiological images as listed and agreed with the findings in the report.  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  S/p mesenteric lymph node biopsy. JULY 2021-at Dx: 3.5cm ; CT march 2024- 5.3 x 4.4 cm-Dominant mesenteric mass is slightly larger today compared to prior; additional mesenteric adenopathy noted [not enlarged by size criteria]; however, new additional there are 2 developing splenic lesions. The spleen itself is nonenlarged.  MRI-March 2024-similar dominant mesenteric mass; however spleen lesion still inconclusive. Will get PET scan for further evaluation.  # Given the increase in size of the tumor; and also splenic lesions on the indeterminate-recommend starting chemotherapy with Bendamustine rituximab q. monthly x 6 vs-rituximab weekly x 4 single agent.  Discussed the pros and cons of each option at length.  Discussed that in general response rates are in the range of 80% with combination chemotherapy; and the responses are deeper/and longer.  Side effects from the combination treatment are slightly higher compared to single agent rituximab.  Given the absence of any significant co-morbidities/and possible splenic lesions would consider combination chemotherapy for this patient. Patient lenaing towards single agent rituxaimab, however- I would prefer R-Benda. Await above PET scan.   # Would recommend check for hepatitis workup prior to initiating treatment with rituximab based therapies. # Vaccinations prophylactic: Recommend shingles; and also pneumococcal vaccination in anticipation of upcoming rituximab based therapy.   # Prostate cancer-s/p RP   PSA normal-continue follow-up with urology [Dr.Borden; No fu].march 2024- PSA -WNL.    # DISPOSITION:  # follow up in 6  weeks- MD; labs- cbc/cmp; L[facility]; PET scan--Dr.B.  All questions were  answered. The patient knows to call the clinic with any problems, questions or concerns.    Earna Coder, MD 10/05/2022 11:15 AM

## 2022-10-05 NOTE — Patient Instructions (Signed)
#   Recommend shingles; pneumococcal; Flu shot vaccination

## 2022-10-05 NOTE — Progress Notes (Signed)
Pt has no complaints today. 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 Arthritis in R knee, chronic aching. Denies night sweats or fever. Good appetite. No swollen lymph nodes.

## 2022-10-05 NOTE — Assessment & Plan Note (Addendum)
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  S/p mesenteric lymph node biopsy. JULY 2021-at Dx: 3.5cm ; CT march 2024- 5.3 x 4.4 cm-Dominant mesenteric mass is slightly larger today compared to prior; additional mesenteric adenopathy noted [not enlarged by size criteria]; however, new additional there are 2 developing splenic lesions. The spleen itself is nonenlarged.  MRI-March 2024-similar dominant mesenteric mass; however spleen lesion still inconclusive. Will get PET scan for further evaluation. Hepatitis panel-pending.   # Given the increase in size of the tumor; and also splenic lesions on the indeterminate-recommend starting chemotherapy with Bendamustine rituximab q. monthly x 6 vs-rituximab weekly x 4 single agent.  Discussed the pros and cons of each option at length.  Discussed that in general response rates are in the range of 80% with combination chemotherapy; and the responses are deeper/and longer.  Side effects from the combination treatment are slightly higher compared to single agent rituximab.  Given the absence of any significant co-morbidities/and possible splenic lesions would consider combination chemotherapy for this patient. Patient lenaing towards single agent rituxaimab, however- I would prefer R-Benda. Await above PET scan.   # Would recommend check for hepatitis workup prior to initiating treatment with rituximab based therapies. # Vaccinations prophylactic: Recommend shingles; and also pneumococcal vaccination in anticipation of upcoming rituximab based therapy.   # Prostate cancer-s/p RP   PSA normal-continue follow-up with urology [Dr.Borden; No fu].march 2024- PSA -WNL.    # DISPOSITION:  # follow up in 6  weeks- MD; labs- cbc/cmp; LDH; PET scan--Dr.B.

## 2022-10-06 ENCOUNTER — Other Ambulatory Visit: Payer: Self-pay

## 2022-11-15 ENCOUNTER — Ambulatory Visit
Admission: RE | Admit: 2022-11-15 | Discharge: 2022-11-15 | Disposition: A | Discharge status: Home / Self Care | Payer: Medicare PPO | Source: Ambulatory Visit | Attending: Internal Medicine | Admitting: Internal Medicine

## 2022-11-15 DIAGNOSIS — R9389 Abnormal findings on diagnostic imaging of other specified body structures: Secondary | ICD-10-CM | POA: Insufficient documentation

## 2022-11-15 DIAGNOSIS — Z8546 Personal history of malignant neoplasm of prostate: Secondary | ICD-10-CM | POA: Diagnosis not present

## 2022-11-15 DIAGNOSIS — Z9079 Acquired absence of other genital organ(s): Secondary | ICD-10-CM | POA: Insufficient documentation

## 2022-11-15 DIAGNOSIS — I2510 Atherosclerotic heart disease of native coronary artery without angina pectoris: Secondary | ICD-10-CM | POA: Insufficient documentation

## 2022-11-15 DIAGNOSIS — I700 Atherosclerosis of aorta: Secondary | ICD-10-CM | POA: Diagnosis not present

## 2022-11-15 DIAGNOSIS — C8213 Follicular lymphoma grade II, intra-abdominal lymph nodes: Secondary | ICD-10-CM | POA: Diagnosis not present

## 2022-11-15 DIAGNOSIS — N433 Hydrocele, unspecified: Secondary | ICD-10-CM | POA: Insufficient documentation

## 2022-11-15 LAB — GLUCOSE, CAPILLARY: Glucose-Capillary: 99 mg/dL (ref 70–99)

## 2022-11-15 MED ORDER — FLUDEOXYGLUCOSE F - 18 (FDG) INJECTION
9.1800 | Freq: Once | INTRAVENOUS | Status: AC | PRN
  Administered 2022-11-15: 9.18 via INTRAVENOUS

## 2022-11-23 ENCOUNTER — Inpatient Hospital Stay: Payer: Medicare PPO | Admitting: Internal Medicine

## 2022-11-23 ENCOUNTER — Encounter: Payer: Self-pay | Admitting: Internal Medicine

## 2022-11-23 ENCOUNTER — Inpatient Hospital Stay: Payer: Medicare PPO | Attending: Internal Medicine

## 2022-11-23 VITALS — BP 146/75 | HR 51 | Temp 98.1°F | Ht 67.0 in | Wt 172.0 lb

## 2022-11-23 DIAGNOSIS — C8593 Non-Hodgkin lymphoma, unspecified, intra-abdominal lymph nodes: Secondary | ICD-10-CM | POA: Insufficient documentation

## 2022-11-23 DIAGNOSIS — Z8546 Personal history of malignant neoplasm of prostate: Secondary | ICD-10-CM | POA: Insufficient documentation

## 2022-11-23 DIAGNOSIS — C8213 Follicular lymphoma grade II, intra-abdominal lymph nodes: Secondary | ICD-10-CM | POA: Diagnosis not present

## 2022-11-23 DIAGNOSIS — C61 Malignant neoplasm of prostate: Secondary | ICD-10-CM

## 2022-11-23 LAB — CMP (CANCER CENTER ONLY)
ALT: 21 U/L (ref 0–44)
AST: 25 U/L (ref 15–41)
Albumin: 3.9 g/dL (ref 3.5–5.0)
Alkaline Phosphatase: 66 U/L (ref 38–126)
Anion gap: 10 (ref 5–15)
BUN: 18 mg/dL (ref 8–23)
CO2: 25 mmol/L (ref 22–32)
Calcium: 8.9 mg/dL (ref 8.9–10.3)
Chloride: 101 mmol/L (ref 98–111)
Creatinine: 0.84 mg/dL (ref 0.61–1.24)
GFR, Estimated: >60 mL/min (ref >60)
Glucose, Bld: 102 mg/dL — ABNORMAL HIGH (ref 70–99)
Potassium: 4.3 mmol/L (ref 3.5–5.1)
Sodium: 136 mmol/L (ref 135–145)
Total Bilirubin: 0.8 mg/dL (ref <1.2)
Total Protein: 6.8 g/dL (ref 6.5–8.1)

## 2022-11-23 LAB — CBC WITH DIFFERENTIAL (CANCER CENTER ONLY)
Abs Immature Granulocytes: 0.01 10*3/uL (ref 0.00–0.07)
Basophils Absolute: 0 10*3/uL (ref 0.0–0.1)
Basophils Relative: 1 %
Eosinophils Absolute: 0.4 10*3/uL (ref 0.0–0.5)
Eosinophils Relative: 7 %
HCT: 52 % (ref 39.0–52.0)
Hemoglobin: 17.4 g/dL — ABNORMAL HIGH (ref 13.0–17.0)
Immature Granulocytes: 0 %
Lymphocytes Relative: 25 %
Lymphs Abs: 1.4 10*3/uL (ref 0.7–4.0)
MCH: 31.8 pg (ref 26.0–34.0)
MCHC: 33.5 g/dL (ref 30.0–36.0)
MCV: 94.9 fL (ref 80.0–100.0)
Monocytes Absolute: 0.6 10*3/uL (ref 0.1–1.0)
Monocytes Relative: 11 %
Neutro Abs: 3.1 10*3/uL (ref 1.7–7.7)
Neutrophils Relative %: 56 %
Platelet Count: 273 10*3/uL (ref 150–400)
RBC: 5.48 MIL/uL (ref 4.22–5.81)
RDW: 12.5 % (ref 11.5–15.5)
WBC Count: 5.6 10*3/uL (ref 4.0–10.5)
nRBC: 0 % (ref 0.0–0.2)

## 2022-11-23 LAB — LACTATE DEHYDROGENASE: LDH: 157 U/L (ref 98–192)

## 2022-11-23 NOTE — Progress Notes (Signed)
[facility] CONSULT NOTE  Patient Care Team: Bary Leriche, MD as PCP - General (Internal Medicine) Vernie Murders, MD as Consulting Physician (Otolaryngology) Earna Coder, MD as Consulting Physician (Internal Medicine)  CHIEF COMPLAINTS/PURPOSE OF CONSULTATION: Lymphoma  #  Oncology History Overview Note  #Prostate cancer-s/p RP- Dr.Borden; July 2020- non-detectable.  #June 2021-CT scan-3.5 cm mesenteric mass [incidental]; # July 30th 2021- CT-Biopsy- # DIAGNOSIS: A. 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  Accompanied by his wife.  Patrick Pena 72 y.o.  male follicle lymphoma grade 1-2-currently under surveillance is here for follow-up/review results of  PET scan.   Pt has no complaints today. 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    Patient denies night sweats or fever. Good appetite. No swollen lymph node. Patient denies any nausea vomiting.  No fevers or chills.  Denies any abdominal pain.  No new lumps or bumps.  Review of Systems  Constitutional:  Negative for chills, diaphoresis, fever, malaise/fatigue and weight loss.  HENT:  Negative for nosebleeds and sore throat.   Eyes:  Negative for double vision.  Respiratory:   Negative for cough, hemoptysis, sputum production, shortness of breath and wheezing.   Cardiovascular:  Negative for chest pain, palpitations, orthopnea and leg swelling.  Gastrointestinal:  Negative for abdominal pain, blood in stool, constipation, diarrhea, heartburn, melena, nausea and vomiting.  Genitourinary:  Negative for dysuria, frequency and urgency.  Musculoskeletal:  Positive for back pain and joint pain.  Skin: Negative.  Negative for itching and rash.  Neurological:  Negative for dizziness, tingling, focal weakness, weakness and headaches.  Endo/Heme/Allergies:  Does not bruise/bleed easily.  Psychiatric/Behavioral:  Negative for depression. The patient is not nervous/anxious and does not have insomnia.      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 03/13/2013   Fractured pelvis (HCC) 06/25/2019   from fall off of ladder   GERD (gastroesophageal reflux disease)    Hypercholesteremia    Hypercholesterolemia 03/13/2013   Hypertension    Prostate cancer [facility])    prostate cancer     SURGICAL HISTORY: Past Surgical History:  Procedure Laterality Date   LAPAROTOMY N/A 08/14/2019   Procedure: EXPLORATORY LAPAROTOMY EXCISION MESENTERIC MASS;  Surgeon: Earline Mayotte, MD;  Location: [facility] ORS;  Service: General;  Laterality: N/A;   LYMPHADENECTOMY Bilateral 02/04/2015   Procedure: BILATERAL PELVIC LYMPHADENECTOMY;  Surgeon: Heloise Purpura, MD;  Location: [facility] ORS;  Service: Urology;  Laterality: Bilateral;   ROBOT ASSISTED LAPAROSCOPIC RADICAL PROSTATECTOMY N/A 02/04/2015   Procedure: XI ROBOTIC ASSISTED LAPAROSCOPIC RADICAL PROSTATECTOMY LEVEL 2;  Surgeon: Heloise Purpura, MD;  Location: [facility] ORS;  Service: Urology;  Laterality: N/A;    SOCIAL HISTORY: Social History   Socioeconomic  History   Marital status: Married    Spouse name: Not on file   Number of children: Not on file   Years of education: Not on file   Highest education level: Not on file  Occupational History   Not on file  Tobacco Use   Smoking status: Never   Smokeless tobacco: Never  Vaping Use   Vaping status: Never Used  Substance and Sexual Activity   Alcohol use: Yes    Comment: 2-4 ounces almost every day    Drug use: No   Sexual activity: Not on file  Other Topics Concern   Not on file  Social History Narrative   Lives in [place]; Curator; never smoked; whiskey every day.    Social Determinants of Health   Financial Resource Strain: Not on file  Food Insecurity: Not on file  Transportation Needs: Not on file  Physical Activity: Not on file  Stress: Not on file  Social Connections: Not on file  Intimate Partner Violence: Not on file    FAMILY HISTORY: Family History  Problem Relation Age of Onset   Diabetes Mellitus II Other    Hyperlipidemia Other    Stroke Other    Hypertension Other    Kidney cancer Sister        survived   Hypertension Father    Prostate cancer Neg Hx    Tuberculosis Neg Hx     ALLERGIES:  is allergic to atorvastatin.  MEDICATIONS:  Current Outpatient Medications  Medication Sig Dispense Refill   aspirin EC 81 MG tablet Take 81 mg by mouth daily. Swallow whole.     Calcium Carb-Cholecalciferol (CALCIUM 600 + D PO) Take 1 tablet by mouth 2 (two) times daily.     IVERMECTIN PO Take 12 mg by mouth in the morning and at bedtime.     Liniments (DEEP BLUE RELIEF EX) Apply 1 application topically 2 (two) times daily as needed (muscle pain.).     lisinopril (PRINIVIL,ZESTRIL) 5 MG tablet TAKE 1 TABLET BY MOUTH DAILY 30 tablet 0   Multiple Vitamin (MULTIVITAMIN) tablet Take 1 tablet by mouth daily.     Naltrexone HCl, Pain, 4.5 MG CAPS Take 1 Dose by mouth daily.     famotidine (PEPCID) 40 MG tablet Take 1 tablet by mouth at bedtime. (Patient not taking:  Reported on 10/05/2022)     No current facility-administered medications for this visit.      Marland Kitchen  PHYSICAL EXAMINATION: ECOG PERFORMANCE STATUS: 0 - Asymptomatic  Vitals:   11/23/22 0922  BP: (!) 146/75  Pulse: (!) 51  Temp: 98.1 F (36.7 C)  SpO2: 98%    Filed Weights   11/23/22 0922  Weight: 172 lb (78 kg)    Physical Exam Constitutional:      Comments: Accompanied by his wife.  He is walking independently.  HENT:     Head: Normocephalic and atraumatic.     Mouth/Throat:     Pharynx: No oropharyngeal exudate.  Eyes:     Pupils: Pupils are equal, round, and reactive to light.  Cardiovascular:     Rate and Rhythm: Normal rate and regular rhythm.  Pulmonary:     Effort: Pulmonary effort is normal. No respiratory distress.     Breath sounds: Normal breath sounds. No wheezing.  Abdominal:     General: Bowel sounds are normal. There is no distension.  Palpations: Abdomen is soft. There is no mass.     Tenderness: There is no abdominal tenderness. There is no guarding or rebound.  Musculoskeletal:        General: No tenderness. Normal range of motion.     Cervical back: Normal range of motion and neck supple.  Skin:    General: Skin is warm.  Neurological:     Mental Status: He is alert and oriented to person, place, and time.  Psychiatric:        Mood and Affect: Affect normal.      LABORATORY DATA:  I have reviewed the data as listed Lab Results  Component Value Date   WBC 5.6 11/23/2022   HGB 17.4 (H) 11/23/2022   HCT 52.0 11/23/2022   MCV 94.9 11/23/2022   PLT 273 11/23/2022   Recent Labs    03/14/22 1027 10/05/22 0912 11/23/22 0927  NA 135 131* 136  K 4.3 4.2 4.3  CL 99 99 101  CO2 27 25 25   GLUCOSE 98 100* 102*  BUN 20 18 18   CREATININE 0.94 0.95 0.84  CALCIUM 9.3 8.9 8.9  GFRNONAA >60 >60 >60  PROT 7.4 6.9 6.8  ALBUMIN 4.1 4.0 3.9  AST 25 23 25   ALT 20 19 21   ALKPHOS 65 65 66  BILITOT 0.9 1.1 0.8    RADIOGRAPHIC STUDIES: I  have personally reviewed the radiological images as listed and agreed with the findings in the report.  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 [II opinion at [facility]]  S/p mesenteric lymph node biopsy- currently on surveillance/ patient preference.   11/13/2022-  Progressive hypermetabolic mesenteric lymphoma. 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  # Given the increase in size of the tumor; and also splenic lesions on the indeterminate-recommend starting chemotherapy with Bendamustine rituximab q. monthly x 6 vs-rituximab weekly x 4 single agent. Hepatitis B/C- Negative  I again discussed the pros and cons of each option at length.  Discussed that in general response rates are in the range of 80% with combination chemotherapy; and the responses are deeper/and longer.    Side effects from the combination treatment are slightly higher compared to single agent rituximab.  Given the absence of any significant co-morbidities/and possible splenic lesions would consider combination chemotherapy for this patient.    # Vaccinations prophylactic: Recommend shingles; and also pneumococcal vaccination in anticipation of upcoming rituximab based therapy.   # Prostate cancer-s/p RP   PSA normal-continue follow-up with urology [Dr.Borden; No fu].march 2024- PSA -WNL- stable.  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   Patient/wife want time to think over his options, they will call us with their decision.  BR- orders in-  # DISPOSITION:  # print a copy of PET scan-  # follow up TBD- Dr.B.    # I reviewed the blood work- with the patient in detail; also reviewed the imaging independently [as summarized above]; and with the patient in detail. All questions were answered. The patient knows to call the clinic with any problems, questions or concerns.    Earna Coder, MD 11/23/2022 1:04 PM

## 2022-11-23 NOTE — Assessment & Plan Note (Addendum)
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 [II opinion at [facility]]  S/p mesenteric lymph node biopsy- currently on surveillance/ patient preference.   11/13/2022-  Progressive hypermetabolic mesenteric lymphoma. 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  # Given the increase in size of the tumor; and also splenic lesions on the indeterminate-recommend starting chemotherapy with Bendamustine rituximab q. monthly x 6 vs-rituximab weekly x 4 single agent. Hepatitis B/C- Negative  I again discussed the pros and cons of each option at length.  Discussed that in general response rates are in the range of 80% with combination chemotherapy; and the responses are deeper/and longer.    Side effects from the combination treatment are slightly higher compared to single agent rituximab.  Given the absence of any significant co-morbidities/and possible splenic lesions would consider combination chemotherapy for this patient.    # Vaccinations prophylactic: Recommend shingles; and also pneumococcal vaccination in anticipation of upcoming rituximab based therapy.   # Prostate cancer-s/p RP   PSA normal-continue follow-up with urology [Dr.Borden; No fu].march 2024- PSA -WNL- stable.  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   Patient/wife want time to think over his options, they will call us with their decision.  BR- orders in-  # DISPOSITION:  # print a copy of PET scan-  # follow up TBD- Dr.B.   # I reviewed the blood work- with the patient in detail; also reviewed the imaging independently [as summarized above]; and with the patient in detail.

## 2022-11-23 NOTE — Progress Notes (Signed)
PET 11/15/22.  Has one more dose of keflex from where a piece of stainless steele wire stuck him.

## 2022-11-25 ENCOUNTER — Other Ambulatory Visit: Payer: Self-pay

## 2022-12-04 ENCOUNTER — Telehealth: Payer: Self-pay | Admitting: *Deleted

## 2022-12-04 ENCOUNTER — Encounter: Payer: Self-pay | Admitting: Internal Medicine

## 2022-12-04 ENCOUNTER — Other Ambulatory Visit: Payer: Self-pay | Admitting: Internal Medicine

## 2022-12-04 DIAGNOSIS — C8213 Follicular lymphoma grade II, intra-abdominal lymph nodes: Secondary | ICD-10-CM

## 2022-12-04 NOTE — Progress Notes (Signed)
DISCONTINUE ON PATHWAY REGIMEN - Lymphoma and CLL     A cycle is every 28 days:     Rituximab-xxxx      Bendamustine   **Always confirm dose/schedule in your pharmacy ordering system**  REASON: Other Reason PRIOR TREATMENT: ONGE952: Bendamustine + Rituximab IV (90/375) q28 Days x 6 Cycles TREATMENT RESPONSE: Unable to Evaluate  START ON PATHWAY REGIMEN - Lymphoma and CLL     A cycle is every 7 days:     Rituximab-xxxx   **Always confirm dose/schedule in your pharmacy ordering system**  Patient Characteristics: Follicular Lymphoma, Grades 1, 2, and 3A, First Line, Stage III / IV, Asymptomatic or Low Bulk Disease Disease Type: Follicular Lymphoma, Grade 1, 2, or 3A Disease Type: Not Applicable Disease Type: Not Applicable Line of Therapy: First Line Disease Characteristics: Asymptomatic or Low Bulk Disease Intent of Therapy: Non-Curative / Palliative Intent, Discussed with Patient

## 2022-12-04 NOTE — Telephone Encounter (Signed)
Wife called reporting that patient has decided on which treatment he wants and itis Antibody therapy he has chosen.

## 2022-12-05 ENCOUNTER — Encounter: Payer: Self-pay | Admitting: Internal Medicine

## 2022-12-05 ENCOUNTER — Other Ambulatory Visit: Payer: Self-pay

## 2022-12-06 ENCOUNTER — Other Ambulatory Visit: Payer: Self-pay | Admitting: Internal Medicine

## 2022-12-06 DIAGNOSIS — C8213 Follicular lymphoma grade II, intra-abdominal lymph nodes: Secondary | ICD-10-CM

## 2022-12-06 NOTE — Telephone Encounter (Signed)
Pt would like to start therapy ASAP per wife. Britta Mccreedy please call wife to confirm date and time. Thanks.

## 2022-12-06 NOTE — Progress Notes (Signed)
As per patient request will start patient on rituximab.     BC-let me know if the dates work out,  otherwise will have to adjust. On 12/17- order- cbc/cmp  Thanks GB

## 2022-12-07 ENCOUNTER — Other Ambulatory Visit: Payer: Self-pay

## 2022-12-07 ENCOUNTER — Other Ambulatory Visit: Payer: Self-pay | Admitting: Internal Medicine

## 2022-12-07 DIAGNOSIS — C61 Malignant neoplasm of prostate: Secondary | ICD-10-CM

## 2022-12-07 NOTE — Progress Notes (Signed)
Lab orders entered

## 2022-12-08 ENCOUNTER — Other Ambulatory Visit: Payer: Self-pay

## 2022-12-09 ENCOUNTER — Other Ambulatory Visit: Payer: Self-pay

## 2022-12-14 ENCOUNTER — Encounter: Payer: Self-pay | Admitting: Internal Medicine

## 2022-12-14 NOTE — Progress Notes (Signed)
Pharmacist Chemotherapy Monitoring - Initial Assessment    Anticipated start date: 12/21/22   The following has been reviewed per standard work regarding the patient's treatment regimen: The patient's diagnosis, treatment plan and drug doses, and organ/hematologic function Lab orders and baseline tests specific to treatment regimen  The treatment plan start date, drug sequencing, and pre-medications Prior authorization status  Patient's documented medication list, including drug-drug interaction screen and prescriptions for anti-emetics and supportive care specific to the treatment regimen The drug concentrations, fluid compatibility, administration routes, and timing of the medications to be used The patient's access for treatment and lifetime cumulative dose history, if applicable  The patient's medication allergies and previous infusion related reactions, if applicable   Changes made to treatment plan:  N/A  Follow up needed:  N/A   Sharen Hones, PharmD, BCPS Clinical Pharmacist   12/14/2022  12:25 PM

## 2022-12-19 ENCOUNTER — Ambulatory Visit: Payer: Medicare PPO

## 2022-12-19 ENCOUNTER — Ambulatory Visit: Payer: Medicare PPO | Admitting: Internal Medicine

## 2022-12-21 ENCOUNTER — Inpatient Hospital Stay: Payer: Medicare PPO

## 2022-12-21 ENCOUNTER — Encounter: Payer: Self-pay | Admitting: Internal Medicine

## 2022-12-21 ENCOUNTER — Inpatient Hospital Stay: Payer: Medicare PPO | Admitting: Internal Medicine

## 2022-12-21 ENCOUNTER — Inpatient Hospital Stay: Payer: Medicare PPO | Attending: Internal Medicine

## 2022-12-21 VITALS — BP 139/62 | HR 75 | Temp 98.1°F | Resp 16

## 2022-12-21 VITALS — BP 139/60 | HR 61 | Temp 98.6°F | Ht 67.0 in | Wt 173.2 lb

## 2022-12-21 DIAGNOSIS — C8213 Follicular lymphoma grade II, intra-abdominal lymph nodes: Secondary | ICD-10-CM

## 2022-12-21 DIAGNOSIS — Z79899 Other long term (current) drug therapy: Secondary | ICD-10-CM | POA: Insufficient documentation

## 2022-12-21 DIAGNOSIS — C61 Malignant neoplasm of prostate: Secondary | ICD-10-CM

## 2022-12-21 DIAGNOSIS — Z8546 Personal history of malignant neoplasm of prostate: Secondary | ICD-10-CM | POA: Diagnosis not present

## 2022-12-21 DIAGNOSIS — C8593 Non-Hodgkin lymphoma, unspecified, intra-abdominal lymph nodes: Secondary | ICD-10-CM | POA: Insufficient documentation

## 2022-12-21 DIAGNOSIS — Z5112 Encounter for antineoplastic immunotherapy: Secondary | ICD-10-CM | POA: Insufficient documentation

## 2022-12-21 DIAGNOSIS — Z7982 Long term (current) use of aspirin: Secondary | ICD-10-CM | POA: Diagnosis not present

## 2022-12-21 LAB — CBC WITH DIFFERENTIAL (CANCER CENTER ONLY)
Abs Immature Granulocytes: 0.01 10*3/uL (ref 0.00–0.07)
Basophils Absolute: 0.1 10*3/uL (ref 0.0–0.1)
Basophils Relative: 1 %
Eosinophils Absolute: 0.5 10*3/uL (ref 0.0–0.5)
Eosinophils Relative: 9 %
HCT: 51.3 % (ref 39.0–52.0)
Hemoglobin: 17.3 g/dL — ABNORMAL HIGH (ref 13.0–17.0)
Immature Granulocytes: 0 %
Lymphocytes Relative: 26 %
Lymphs Abs: 1.4 10*3/uL (ref 0.7–4.0)
MCH: 32.2 pg (ref 26.0–34.0)
MCHC: 33.7 g/dL (ref 30.0–36.0)
MCV: 95.5 fL (ref 80.0–100.0)
Monocytes Absolute: 0.6 10*3/uL (ref 0.1–1.0)
Monocytes Relative: 10 %
Neutro Abs: 2.9 10*3/uL (ref 1.7–7.7)
Neutrophils Relative %: 54 %
Platelet Count: 280 10*3/uL (ref 150–400)
RBC: 5.37 MIL/uL (ref 4.22–5.81)
RDW: 12.9 % (ref 11.5–15.5)
WBC Count: 5.5 10*3/uL (ref 4.0–10.5)
nRBC: 0 % (ref 0.0–0.2)

## 2022-12-21 LAB — CMP (CANCER CENTER ONLY)
ALT: 17 U/L (ref 0–44)
AST: 24 U/L (ref 15–41)
Albumin: 3.8 g/dL (ref 3.5–5.0)
Alkaline Phosphatase: 65 U/L (ref 38–126)
Anion gap: 12 (ref 5–15)
BUN: 17 mg/dL (ref 8–23)
CO2: 26 mmol/L (ref 22–32)
Calcium: 9.1 mg/dL (ref 8.9–10.3)
Chloride: 98 mmol/L (ref 98–111)
Creatinine: 1.02 mg/dL (ref 0.61–1.24)
GFR, Estimated: >60 mL/min (ref >60)
Glucose, Bld: 155 mg/dL — ABNORMAL HIGH (ref 70–99)
Potassium: 4.3 mmol/L (ref 3.5–5.1)
Sodium: 136 mmol/L (ref 135–145)
Total Bilirubin: 0.6 mg/dL (ref <1.2)
Total Protein: 6.7 g/dL (ref 6.5–8.1)

## 2022-12-21 MED ORDER — SODIUM CHLORIDE 0.9 % IV SOLN
375.0000 mg/m2 | Freq: Once | INTRAVENOUS | Status: AC
  Administered 2022-12-21: 700 mg via INTRAVENOUS
  Filled 2022-12-21: qty 50

## 2022-12-21 MED ORDER — SODIUM CHLORIDE 0.9 % IV SOLN
INTRAVENOUS | Status: DC
  Filled 2022-12-21: qty 250

## 2022-12-21 MED ORDER — DEXAMETHASONE SODIUM PHOSPHATE 10 MG/ML IJ SOLN
10.0000 mg | Freq: Once | INTRAMUSCULAR | Status: AC
  Administered 2022-12-21: 10 mg via INTRAVENOUS
  Filled 2022-12-21: qty 1

## 2022-12-21 MED ORDER — DIPHENHYDRAMINE HCL 25 MG PO CAPS
50.0000 mg | ORAL_CAPSULE | Freq: Once | ORAL | Status: AC
Start: 2022-12-21 — End: 2022-12-21
  Administered 2022-12-21: 50 mg via ORAL
  Filled 2022-12-21: qty 2

## 2022-12-21 MED ORDER — SODIUM CHLORIDE 0.9 % IV SOLN: 10.0000 mg | Freq: Once | INTRAVENOUS | Status: DC

## 2022-12-21 MED ORDER — MONTELUKAST SODIUM 10 MG PO TABS
10.0000 mg | ORAL_TABLET | Freq: Every day | ORAL | Status: DC
  Administered 2022-12-21: 10 mg via ORAL
  Filled 2022-12-21: qty 1

## 2022-12-21 MED ORDER — ACETAMINOPHEN 325 MG PO TABS
650.0000 mg | ORAL_TABLET | Freq: Once | ORAL | Status: AC
  Administered 2022-12-21: 650 mg via ORAL
  Filled 2022-12-21: qty 2

## 2022-12-21 NOTE — Assessment & Plan Note (Addendum)
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 Patient declined- Bendamustine rituximab; proceed with rituximab weekly x 4. Hepatitis B/C- Negative.  # proceed with rituximab weekly-reviewed the labs.  Discussed that we will plan imaging about 3 months post treatment-sometime in April 24.  Discussed regarding infusion reactions from rituximab.  Added singular and dexamethasone in addition to Benadryl/Tylenol.  Again discussed regarding immune dysregulation.   # Vaccinations prophylactic: s/p  shingles; s/p pneumococcal vaccination.  # Prostate cancer-s/p RP   PSA normal-continue follow-up with urology [Dr.Borden; No fu].march 2024- PSA -WNL- stable.   # DISPOSITION:  # infusion today # as per IS- in 1 week-no labs; infusion  # 2 week-no labs; infusion  # 3 week- MD; no labs; infusion- Dr.B.   # I reviewed the blood work- with the patient in detail; also reviewed the imaging independently [as summarized above]; and with the patient in detail.

## 2022-12-21 NOTE — Patient Instructions (Signed)
[facility] - A DEPT OF [facility]  Discharge Instructions: Thank you for choosing [facility] to provide your oncology and hematology care.  If you have a lab appointment with the Cancer Center, please go directly to the Cancer Center and check in at the registration area.  Wear comfortable clothing and clothing appropriate for easy access to any Portacath or PICC line.   We strive to give you quality time with your provider. You may need to reschedule your appointment if you arrive late (15 or more minutes).  Arriving late affects you and other patients whose appointments are after yours.  Also, if you miss three or more appointments without notifying the office, you may be dismissed from the clinic at the provider's discretion.      For prescription refill requests, have your pharmacy contact our office and allow 72 hours for refills to be completed.    Today you received the following chemotherapy and/or immunotherapy agents rituximab    To help prevent nausea and vomiting after your treatment, we encourage you to take your nausea medication as directed.  BELOW ARE SYMPTOMS THAT SHOULD BE REPORTED IMMEDIATELY: *FEVER GREATER THAN 100.4 F (38 C) OR HIGHER *CHILLS OR SWEATING *NAUSEA AND VOMITING THAT IS NOT CONTROLLED WITH YOUR NAUSEA MEDICATION *UNUSUAL SHORTNESS OF BREATH *UNUSUAL BRUISING OR BLEEDING *URINARY PROBLEMS (pain or burning when urinating, or frequent urination) *BOWEL PROBLEMS (unusual diarrhea, constipation, pain near the anus) TENDERNESS IN MOUTH AND THROAT WITH OR WITHOUT PRESENCE OF ULCERS (sore throat, sores in mouth, or a toothache) UNUSUAL RASH, SWELLING OR PAIN  UNUSUAL VAGINAL DISCHARGE OR ITCHING   Items with * indicate a potential emergency and should be followed up as soon as possible or go to the Emergency Department if any problems should occur.  Please show the CHEMOTHERAPY ALERT CARD or IMMUNOTHERAPY  ALERT CARD at check-in to the Emergency Department and triage nurse.  Should you have questions after your visit or need to cancel or reschedule your appointment, please contact [facility] - A DEPT OF [facility]  [phone] and follow the prompts.  Office hours are 8:00 a.m. to 4:30 p.m. Monday - Friday. Please note that voicemails left after 4:00 p.m. may not be returned until the following business day.  We are closed weekends and major holidays. You have access to a nurse at all times for urgent questions. Please call the main number to the clinic [phone] and follow the prompts.  For any non-urgent questions, you may also contact your provider using MyChart. We now offer e-Visits for anyone 28 and older to request care online for non-urgent symptoms. For details visit [email or web address].   Also download the MyChart app! Go to the app store, search "MyChart", open the app, select [place], and log in with your MyChart username and password.  Rituximab Injection What is this medication? RITUXIMAB (ri TUX i mab) treats leukemia and lymphoma. It works by blocking a protein that causes cancer cells to grow and multiply. This helps to slow or stop the spread of cancer cells. It may also be used to treat autoimmune conditions, such as arthritis. It works by slowing down an overactive immune system. It is a monoclonal antibody. This medicine may be used for other purposes; ask your health care provider or pharmacist if you have questions. COMMON BRAND NAME(S): RIABNI, Rituxan, RUXIENCE, truxima What should I tell my care team before  I take this medication? They need to know if you have any of these conditions: Chest pain Heart disease Immune system problems Infection, such as chickenpox, cold sores, hepatitis B, herpes Irregular heartbeat or rhythm Kidney disease Low blood counts, such as low white cells, platelets, red cells Lung disease Recent or  upcoming vaccine An unusual or allergic reaction to rituximab, other medications, foods, dyes, or preservatives Pregnant or trying to get pregnant Breast-feeding How should I use this medication? This medication is injected into a vein. It is given by a care team in a hospital or clinic setting. A special MedGuide will be given to you before each treatment. Be sure to read this information carefully each time. Talk to your care team about the use of this medication in children. While this medication may be prescribed for children as young as 6 months for selected conditions, precautions do apply. Overdosage: If you think you have taken too much of this medicine contact a poison control center or emergency room at once. NOTE: This medicine is only for you. Do not share this medicine with others. What if I miss a dose? Keep appointments for follow-up doses. It is important not to miss your dose. Call your care team if you are unable to keep an appointment. What may interact with this medication? Do not take this medication with any of the following: Live vaccines This medication may also interact with the following: Cisplatin This list may not describe all possible interactions. Give your health care provider a list of all the medicines, herbs, non-prescription drugs, or dietary supplements you use. Also tell them if you smoke, drink alcohol, or use illegal drugs. Some items may interact with your medicine. What should I watch for while using this medication? Your condition will be monitored carefully while you are receiving this medication. You may need blood work while taking this medication. This medication can cause serious infusion reactions. To reduce the risk your care team may give you other medications to take before receiving this one. Be sure to follow the directions from your care team. This medication may increase your risk of getting an infection. Call your care team for advice if  you get a fever, chills, sore throat, or other symptoms of a cold or flu. Do not treat yourself. Try to avoid being around people who are sick. Call your care team if you are around anyone with measles, chickenpox, or if you develop sores or blisters that do not heal properly. Avoid taking medications that contain aspirin, acetaminophen, ibuprofen, naproxen, or ketoprofen unless instructed by your care team. These medications may hide a fever. This medication may cause serious skin reactions. They can happen weeks to months after starting the medication. Contact your care team right away if you notice fevers or flu-like symptoms with a rash. The rash may be red or purple and then turn into blisters or peeling of the skin. You may also notice a red rash with swelling of the face, lips, or lymph nodes in your neck or under your arms. In some patients, this medication may cause a serious brain infection that may cause death. If you have any problems seeing, thinking, speaking, walking, or standing, tell your care team right away. If you cannot reach your care team, urgently seek another source of medical care. Talk to your care team if you may be pregnant. Serious birth defects can occur if you take this medication during pregnancy and for 12 months after the last dose.  You will need a negative pregnancy test before starting this medication. Contraception is recommended while taking this medication and for 12 months after the last dose. Your care team can help you find the option that works for you. Do not breastfeed while taking this medication and for at least 6 months after the last dose. What side effects may I notice from receiving this medication? Side effects that you should report to your care team as soon as possible: Allergic reactions or angioedema--skin rash, itching or hives, swelling of the face, eyes, lips, tongue, arms, or legs, trouble swallowing or breathing Bowel blockage--stomach cramping,  unable to have a bowel movement or pass gas, loss of appetite, vomiting Dizziness, loss of balance or coordination, confusion or trouble speaking Heart attack--pain or tightness in the chest, shoulders, arms, or jaw, nausea, shortness of breath, cold or clammy skin, feeling faint or lightheaded Heart rhythm changes--fast or irregular heartbeat, dizziness, feeling faint or lightheaded, chest pain, trouble breathing Infection--fever, chills, cough, sore throat, wounds that don't heal, pain or trouble when passing urine, general feeling of discomfort or being unwell Infusion reactions--chest pain, shortness of breath or trouble breathing, feeling faint or lightheaded Kidney injury--decrease in the amount of urine, swelling of the ankles, hands, or feet Liver injury--right upper belly pain, loss of appetite, nausea, light-colored stool, dark yellow or brown urine, yellowing skin or eyes, unusual weakness or fatigue Redness, blistering, peeling, or loosening of the skin, including inside the mouth Stomach pain that is severe, does not go away, or gets worse Tumor lysis syndrome (TLS)--nausea, vomiting, diarrhea, decrease in the amount of urine, dark urine, unusual weakness or fatigue, confusion, muscle pain or cramps, fast or irregular heartbeat, joint pain Side effects that usually do not require medical attention (report to your care team if they continue or are bothersome): Headache Joint pain Nausea Runny or stuffy nose Unusual weakness or fatigue This list may not describe all possible side effects. Call your doctor for medical advice about side effects. You may report side effects to FDA at 1-800-FDA-1088. Where should I keep my medication? This medication is given in a hospital or clinic. It will not be stored at home. NOTE: This sheet is a summary. It may not cover all possible information. If you have questions about this medicine, talk to your doctor, pharmacist, or health care provider.   2024 Elsevier/Gold Standard (2021-05-12 00:00:00)

## 2022-12-21 NOTE — Progress Notes (Signed)
Had a flu and pneumonia shot 2 weeks ago.  Having some trouble sleeping due to worrying about starting infusions today. Not taking any sleep aids.

## 2022-12-21 NOTE — Progress Notes (Signed)
Cancer Center CONSULT NOTE  Patient Care Team: Bary Leriche, MD as PCP - General (Internal Medicine) Vernie Murders, MD as Consulting Physician (Otolaryngology) Earna Coder, MD as Consulting Physician (Internal Medicine)  CHIEF COMPLAINTS/PURPOSE OF CONSULTATION: Lymphoma  #  Oncology History Overview Note  #Prostate cancer-s/p RP- Dr.Borden; July 2020- non-detectable.  #June 2021-CT scan-3.5 cm mesenteric mass [incidental]; # July 30th 2021- CT-Biopsy- # DIAGNOSIS: A. 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  Accompanied by his wife.  Patrick Pena 72 y.o.  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   Had a flu and pneumonia shot 2 weeks ago.   Having some trouble sleeping due to worrying about starting infusions today. Not taking any sleep aids..  Pt has no complaints today. 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    Patient denies night sweats or fever. Good appetite. No swollen lymph node. Patient denies any nausea vomiting.  No fevers or chills.  Denies any abdominal pain.   No new lumps or bumps.  Review of Systems  Constitutional:  Negative for chills, diaphoresis, fever, malaise/fatigue and weight loss.  HENT:  Negative for nosebleeds and sore throat.   Eyes:  Negative for double vision.  Respiratory:  Negative for cough, hemoptysis, sputum production, shortness of breath and wheezing.   Cardiovascular:  Negative for chest pain, palpitations, orthopnea and leg swelling.  Gastrointestinal:  Negative for abdominal pain, blood in stool, constipation, diarrhea, heartburn, melena, nausea and vomiting.  Genitourinary:  Negative for dysuria, frequency and urgency.  Musculoskeletal:  Positive for back pain and joint pain.  Skin: Negative.  Negative for itching and rash.  Neurological:  Negative for dizziness, tingling, focal weakness, weakness and headaches.  Endo/Heme/Allergies:  Does not bruise/bleed easily.  Psychiatric/Behavioral:  Negative for depression. The patient is not nervous/anxious and does not have insomnia.      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 03/13/2013   Fractured pelvis (HCC) 06/25/2019   from fall off of ladder   GERD (gastroesophageal reflux disease)    Hypercholesteremia    Hypercholesterolemia 03/13/2013   Hypertension    Prostate cancer [facility])    prostate cancer     SURGICAL HISTORY: Past Surgical History:  Procedure Laterality Date   LAPAROTOMY N/A 08/14/2019   Procedure: EXPLORATORY LAPAROTOMY EXCISION MESENTERIC MASS;  Surgeon: Earline Mayotte, MD;  Location: [facility] ORS;  Service: General;  Laterality: N/A;   LYMPHADENECTOMY Bilateral 02/04/2015  Procedure: BILATERAL PELVIC LYMPHADENECTOMY;  Surgeon: Heloise Purpura, MD;  Location: [facility] ORS;  Service: Urology;  Laterality: Bilateral;   ROBOT ASSISTED LAPAROSCOPIC RADICAL  PROSTATECTOMY N/A 02/04/2015   Procedure: XI ROBOTIC ASSISTED LAPAROSCOPIC RADICAL PROSTATECTOMY LEVEL 2;  Surgeon: Heloise Purpura, MD;  Location: [facility] ORS;  Service: Urology;  Laterality: N/A;    SOCIAL HISTORY: Social History   Socioeconomic History   Marital status: Married    Spouse name: Not on file   Number of children: Not on file   Years of education: Not on file   Highest education level: Not on file  Occupational History   Not on file  Tobacco Use   Smoking status: Never   Smokeless tobacco: Never  Vaping Use   Vaping status: Never Used  Substance and Sexual Activity   Alcohol use: Yes    Comment: 2-4 ounces almost every day    Drug use: No   Sexual activity: Not on file  Other Topics Concern   Not on file  Social History Narrative   Lives in [place]; Curator; never smoked; whiskey every day.    Social Drivers of Corporate investment banker Strain: Not on file  Food Insecurity: Not on file  Transportation Needs: Not on file  Physical Activity: Not on file  Stress: Not on file  Social Connections: Not on file  Intimate Partner Violence: Not on file    FAMILY HISTORY: Family History  Problem Relation Age of Onset   Diabetes Mellitus II Other    Hyperlipidemia Other    Stroke Other    Hypertension Other    Kidney cancer Sister        survived   Hypertension Father    Prostate cancer Neg Hx    Tuberculosis Neg Hx     ALLERGIES:  is allergic to atorvastatin.  MEDICATIONS:  Current Outpatient Medications  Medication Sig Dispense Refill   aspirin EC 81 MG tablet Take 81 mg by mouth daily. Swallow whole.     Calcium Carb-Cholecalciferol (CALCIUM 600 + D PO) Take 1 tablet by mouth 2 (two) times daily.     IVERMECTIN PO Take 12 mg by mouth in the morning and at bedtime.     Liniments (DEEP BLUE RELIEF EX) Apply 1 application topically 2 (two) times daily as needed (muscle pain.).     lisinopril (PRINIVIL,ZESTRIL) 5 MG tablet TAKE 1 TABLET BY MOUTH DAILY 30  tablet 0   Multiple Vitamin (MULTIVITAMIN) tablet Take 1 tablet by mouth daily.     Naltrexone HCl, Pain, 4.5 MG CAPS Take 1 Dose by mouth daily.     No current facility-administered medications for this visit.   Facility-Administered Medications Ordered in Other Visits  Medication Dose Route Frequency Provider Last Rate Last Admin   0.9 %  sodium chloride infusion   Intravenous Continuous Earna Coder, MD   Stopped at 12/21/22 1348   montelukast (SINGULAIR) tablet 10 mg  10 mg Oral QHS Earna Coder, MD   10 mg at 12/21/22 0932      .  PHYSICAL EXAMINATION: ECOG PERFORMANCE STATUS: 0 - Asymptomatic  Vitals:   12/21/22 0828  BP: 139/60  Pulse: 61  Temp: 98.6 F (37 C)  SpO2: 98%    Filed Weights   12/21/22 0828  Weight: 173 lb 3.2 oz (78.6 kg)    Physical Exam Constitutional:      Comments: Accompanied by his wife.  He is walking independently.  HENT:  Head: Normocephalic and atraumatic.     Mouth/Throat:     Pharynx: No oropharyngeal exudate.  Eyes:     Pupils: Pupils are equal, round, and reactive to light.  Cardiovascular:     Rate and Rhythm: Normal rate and regular rhythm.  Pulmonary:     Effort: Pulmonary effort is normal. No respiratory distress.     Breath sounds: Normal breath sounds. No wheezing.  Abdominal:     General: Bowel sounds are normal. There is no distension.     Palpations: Abdomen is soft. There is no mass.     Tenderness: There is no abdominal tenderness. There is no guarding or rebound.  Musculoskeletal:        General: No tenderness. Normal range of motion.     Cervical back: Normal range of motion and neck supple.  Skin:    General: Skin is warm.  Neurological:     Mental Status: He is alert and oriented to person, place, and time.  Psychiatric:        Mood and Affect: Affect normal.      LABORATORY DATA:  I have reviewed the data as listed Lab Results  Component Value Date   WBC 5.5 12/21/2022   HGB  17.3 (H) 12/21/2022   HCT 51.3 12/21/2022   MCV 95.5 12/21/2022   PLT 280 12/21/2022   Recent Labs    10/05/22 0912 11/23/22 0927 12/21/22 0820  NA 131* 136 136  K 4.2 4.3 4.3  CL 99 101 98  CO2 25 25 26   GLUCOSE 100* 102* 155*  BUN 18 18 17   CREATININE 0.95 0.84 1.02  CALCIUM 8.9 8.9 9.1  GFRNONAA >60 >60 >60  PROT 6.9 6.8 6.7  ALBUMIN 4.0 3.9 3.8  AST 23 25 24   ALT 19 21 17   ALKPHOS 65 66 65  BILITOT 1.1 0.8 0.6    RADIOGRAPHIC STUDIES: I have personally reviewed the radiological images as listed and agreed with the findings in the report.  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 Patient declined- Bendamustine rituximab; proceed with rituximab weekly x 4. Hepatitis B/C- Negative.  # proceed with rituximab weekly-reviewed the labs.  Discussed that we will plan imaging about 3 months post treatment-sometime in April 24.  Discussed regarding infusion reactions from rituximab.  Added singular and dexamethasone in addition to Benadryl/Tylenol.  Again discussed regarding immune dysregulation.   # Vaccinations prophylactic: s/p  shingles; s/p pneumococcal vaccination.  # Prostate cancer-s/p RP   PSA normal-continue follow-up with urology [Dr.Borden; No fu].march 2024- PSA -WNL- stable.   # DISPOSITION:  # infusion today # as per IS- in 1 week-no labs; infusion  # 2 week-no labs; infusion  # 3 week- MD; no labs; infusion- Dr.B.   # I reviewed the blood work- with the patient in detail; also reviewed the imaging independently [as summarized above]; and with  the patient in detail. All questions were answered. The patient knows to call the clinic with any problems, questions or concerns.    Earna Coder, MD 12/21/2022 2:12 PM

## 2022-12-26 ENCOUNTER — Ambulatory Visit: Payer: Medicare PPO

## 2022-12-28 ENCOUNTER — Inpatient Hospital Stay: Payer: Medicare PPO

## 2022-12-28 VITALS — BP 131/60 | HR 55 | Temp 98.0°F | Resp 18 | Wt 175.4 lb

## 2022-12-28 DIAGNOSIS — C8593 Non-Hodgkin lymphoma, unspecified, intra-abdominal lymph nodes: Secondary | ICD-10-CM | POA: Diagnosis not present

## 2022-12-28 DIAGNOSIS — Z7982 Long term (current) use of aspirin: Secondary | ICD-10-CM | POA: Diagnosis not present

## 2022-12-28 DIAGNOSIS — C8213 Follicular lymphoma grade II, intra-abdominal lymph nodes: Secondary | ICD-10-CM

## 2022-12-28 DIAGNOSIS — Z79899 Other long term (current) drug therapy: Secondary | ICD-10-CM | POA: Diagnosis not present

## 2022-12-28 DIAGNOSIS — Z8546 Personal history of malignant neoplasm of prostate: Secondary | ICD-10-CM | POA: Diagnosis not present

## 2022-12-28 DIAGNOSIS — Z5112 Encounter for antineoplastic immunotherapy: Secondary | ICD-10-CM | POA: Diagnosis not present

## 2022-12-28 MED ORDER — DEXAMETHASONE SODIUM PHOSPHATE 10 MG/ML IJ SOLN
10.0000 mg | Freq: Once | INTRAMUSCULAR | Status: AC
  Administered 2022-12-28: 10 mg via INTRAVENOUS
  Filled 2022-12-28: qty 1

## 2022-12-28 MED ORDER — MONTELUKAST SODIUM 10 MG PO TABS
10.0000 mg | ORAL_TABLET | Freq: Every day | ORAL | Status: DC
  Administered 2022-12-28: 10 mg via ORAL
  Filled 2022-12-28: qty 1

## 2022-12-28 MED ORDER — DIPHENHYDRAMINE HCL 25 MG PO CAPS
50.0000 mg | ORAL_CAPSULE | Freq: Once | ORAL | Status: AC
  Administered 2022-12-28: 50 mg via ORAL
  Filled 2022-12-28: qty 2

## 2022-12-28 MED ORDER — ACETAMINOPHEN 325 MG PO TABS
650.0000 mg | ORAL_TABLET | Freq: Once | ORAL | Status: AC
  Administered 2022-12-28: 650 mg via ORAL
  Filled 2022-12-28: qty 2

## 2022-12-28 MED ORDER — RITUXIMAB-PVVR CHEMO 500 MG/50ML IV SOLN
375.0000 mg/m2 | Freq: Once | INTRAVENOUS | Status: AC
  Administered 2022-12-28: 700 mg via INTRAVENOUS
  Filled 2022-12-28: qty 50

## 2022-12-28 MED ORDER — SODIUM CHLORIDE 0.9 % IV SOLN
INTRAVENOUS | Status: DC
Start: 2022-12-28 — End: 2022-12-28
  Filled 2022-12-28: qty 250

## 2023-01-01 ENCOUNTER — Encounter: Payer: Self-pay | Admitting: Internal Medicine

## 2023-01-02 ENCOUNTER — Ambulatory Visit: Payer: Medicare PPO

## 2023-01-04 ENCOUNTER — Encounter: Payer: Self-pay | Admitting: Internal Medicine

## 2023-01-04 ENCOUNTER — Inpatient Hospital Stay: Payer: Medicare PPO | Attending: Internal Medicine

## 2023-01-04 VITALS — BP 140/65 | HR 51 | Temp 99.0°F | Resp 18 | Wt 173.8 lb

## 2023-01-04 DIAGNOSIS — Z7964 Long term (current) use of myelosuppressive agent: Secondary | ICD-10-CM | POA: Insufficient documentation

## 2023-01-04 DIAGNOSIS — Z5112 Encounter for antineoplastic immunotherapy: Secondary | ICD-10-CM | POA: Diagnosis not present

## 2023-01-04 DIAGNOSIS — Z8546 Personal history of malignant neoplasm of prostate: Secondary | ICD-10-CM | POA: Insufficient documentation

## 2023-01-04 DIAGNOSIS — C8593 Non-Hodgkin lymphoma, unspecified, intra-abdominal lymph nodes: Secondary | ICD-10-CM | POA: Insufficient documentation

## 2023-01-04 DIAGNOSIS — Z79899 Other long term (current) drug therapy: Secondary | ICD-10-CM | POA: Diagnosis not present

## 2023-01-04 DIAGNOSIS — Z7982 Long term (current) use of aspirin: Secondary | ICD-10-CM | POA: Diagnosis not present

## 2023-01-04 DIAGNOSIS — Z9079 Acquired absence of other genital organ(s): Secondary | ICD-10-CM | POA: Diagnosis not present

## 2023-01-04 DIAGNOSIS — Z79620 Long term (current) use of immunosuppressive biologic: Secondary | ICD-10-CM | POA: Insufficient documentation

## 2023-01-04 DIAGNOSIS — C8213 Follicular lymphoma grade II, intra-abdominal lymph nodes: Secondary | ICD-10-CM

## 2023-01-04 MED ORDER — SODIUM CHLORIDE 0.9 % IV SOLN
INTRAVENOUS | Status: DC
  Filled 2023-01-04: qty 250

## 2023-01-04 MED ORDER — DEXAMETHASONE SODIUM PHOSPHATE 10 MG/ML IJ SOLN
10.0000 mg | Freq: Once | INTRAMUSCULAR | Status: AC
  Administered 2023-01-04: 10 mg via INTRAVENOUS
  Filled 2023-01-04: qty 1

## 2023-01-04 MED ORDER — ACETAMINOPHEN 325 MG PO TABS
650.0000 mg | ORAL_TABLET | Freq: Once | ORAL | Status: AC
Start: 2023-01-04 — End: 2023-01-04
  Administered 2023-01-04: 650 mg via ORAL
  Filled 2023-01-04: qty 2

## 2023-01-04 MED ORDER — DIPHENHYDRAMINE HCL 25 MG PO CAPS
50.0000 mg | ORAL_CAPSULE | Freq: Once | ORAL | Status: AC
  Administered 2023-01-04: 50 mg via ORAL
  Filled 2023-01-04: qty 2

## 2023-01-04 MED ORDER — SODIUM CHLORIDE 0.9 % IV SOLN
375.0000 mg/m2 | Freq: Once | INTRAVENOUS | Status: AC
  Administered 2023-01-04: 700 mg via INTRAVENOUS
  Filled 2023-01-04: qty 50

## 2023-01-04 MED ORDER — MONTELUKAST SODIUM 10 MG PO TABS
10.0000 mg | ORAL_TABLET | Freq: Every day | ORAL | Status: DC
Start: 2023-01-04 — End: 2023-01-04
  Administered 2023-01-04: 10 mg via ORAL
  Filled 2023-01-04: qty 1

## 2023-01-04 NOTE — Progress Notes (Signed)
 Rapid Infusion Rituximab  Pharmacist Evaluation  THADDAEUS GRANJA is a 73 y.o. male being treated with rituximab  for Non-Hodgkins Lymphoma. This patient may be considered for RIR.   A pharmacist has verified the patient tolerated rituximab  infusions per the [facility] standard infusion protocol without grade 3-4 infusion reactions. The treatment plan will be updated to reflect RIR if the patient qualifies per the checklist below:   Age > 38 years old Yes   Clinically significant cardiovascular disease No   Circulating lymphocyte count < 5000/uL prior to cycle two Yes  Lab Results  Component Value Date   LYMPHSABS 1.4 12/21/2022    Prior documented grade 3-4 infusion reaction to rituximab  No   Prior documented grade 1-2 infusion reaction to rituximab  (If YES, Pharmacist will confirm with Physician if patient is still a candidate for RIR) No   Previous rituximab  infusion within the past 6 months Yes   Treatment Plan updated orders to reflect RIR Yes    JAYMARI CROMIE does meet the criteria for Rapid Infusion Rituximab . This patient is going to be switched to rapid infusion rituximab .   Katoria Yetman E Janeece Blok 01/04/23 9:08 AM

## 2023-01-09 ENCOUNTER — Other Ambulatory Visit: Payer: Medicare PPO

## 2023-01-09 ENCOUNTER — Ambulatory Visit: Payer: Medicare PPO | Admitting: Internal Medicine

## 2023-01-09 ENCOUNTER — Ambulatory Visit: Payer: Medicare PPO

## 2023-01-10 ENCOUNTER — Other Ambulatory Visit: Payer: Self-pay

## 2023-01-11 ENCOUNTER — Other Ambulatory Visit: Payer: Medicare PPO

## 2023-01-11 ENCOUNTER — Inpatient Hospital Stay: Payer: Medicare PPO

## 2023-01-11 ENCOUNTER — Ambulatory Visit: Payer: Medicare PPO

## 2023-01-11 ENCOUNTER — Encounter: Payer: Self-pay | Admitting: Internal Medicine

## 2023-01-11 ENCOUNTER — Inpatient Hospital Stay (HOSPITAL_BASED_OUTPATIENT_CLINIC_OR_DEPARTMENT_OTHER): Payer: Medicare PPO | Admitting: Internal Medicine

## 2023-01-11 VITALS — BP 132/67 | HR 60 | Temp 98.6°F | Resp 19 | Wt 172.6 lb

## 2023-01-11 VITALS — BP 121/61 | HR 61 | Temp 97.6°F | Resp 17

## 2023-01-11 DIAGNOSIS — Z7964 Long term (current) use of myelosuppressive agent: Secondary | ICD-10-CM | POA: Diagnosis not present

## 2023-01-11 DIAGNOSIS — C8213 Follicular lymphoma grade II, intra-abdominal lymph nodes: Secondary | ICD-10-CM

## 2023-01-11 DIAGNOSIS — Z79620 Long term (current) use of immunosuppressive biologic: Secondary | ICD-10-CM | POA: Diagnosis not present

## 2023-01-11 DIAGNOSIS — C8593 Non-Hodgkin lymphoma, unspecified, intra-abdominal lymph nodes: Secondary | ICD-10-CM | POA: Diagnosis not present

## 2023-01-11 DIAGNOSIS — Z5112 Encounter for antineoplastic immunotherapy: Secondary | ICD-10-CM | POA: Diagnosis not present

## 2023-01-11 DIAGNOSIS — Z79899 Other long term (current) drug therapy: Secondary | ICD-10-CM | POA: Diagnosis not present

## 2023-01-11 DIAGNOSIS — Z7982 Long term (current) use of aspirin: Secondary | ICD-10-CM | POA: Diagnosis not present

## 2023-01-11 DIAGNOSIS — Z8546 Personal history of malignant neoplasm of prostate: Secondary | ICD-10-CM | POA: Diagnosis not present

## 2023-01-11 DIAGNOSIS — Z9079 Acquired absence of other genital organ(s): Secondary | ICD-10-CM | POA: Diagnosis not present

## 2023-01-11 MED ORDER — DEXAMETHASONE SODIUM PHOSPHATE 10 MG/ML IJ SOLN
10.0000 mg | Freq: Once | INTRAMUSCULAR | Status: AC
Start: 2023-01-11 — End: 2023-01-11
  Administered 2023-01-11: 10 mg via INTRAVENOUS
  Filled 2023-01-11: qty 1

## 2023-01-11 MED ORDER — DIPHENHYDRAMINE HCL 25 MG PO CAPS
50.0000 mg | ORAL_CAPSULE | Freq: Once | ORAL | Status: AC
  Administered 2023-01-11: 50 mg via ORAL
  Filled 2023-01-11: qty 2

## 2023-01-11 MED ORDER — SODIUM CHLORIDE 0.9 % IV SOLN
INTRAVENOUS | Status: DC
  Filled 2023-01-11: qty 250

## 2023-01-11 MED ORDER — ACETAMINOPHEN 325 MG PO TABS
650.0000 mg | ORAL_TABLET | Freq: Once | ORAL | Status: AC
  Administered 2023-01-11: 650 mg via ORAL
  Filled 2023-01-11: qty 2

## 2023-01-11 MED ORDER — MONTELUKAST SODIUM 10 MG PO TABS
10.0000 mg | ORAL_TABLET | Freq: Once | ORAL | Status: AC
  Administered 2023-01-11: 10 mg via ORAL
  Filled 2023-01-11: qty 1

## 2023-01-11 MED ORDER — RITUXIMAB-PVVR CHEMO 500 MG/50ML IV SOLN
375.0000 mg/m2 | Freq: Once | INTRAVENOUS | Status: AC
  Administered 2023-01-11: 700 mg via INTRAVENOUS
  Filled 2023-01-11: qty 50

## 2023-01-11 NOTE — Progress Notes (Signed)
 Patient has no concerns

## 2023-01-11 NOTE — Assessment & Plan Note (Addendum)
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 Patient declined- Bendamustine rituximab ; currently on rituximab  weekly x 4. Hepatitis B/C- Negative.  # proceed with rituximab  weekly # 4 of planned 4 today-reviewed the labs.  Patient tolerated treatment without any major side effects.  Discussed that we will plan imaging about 3 months post treatment-PET scan ordered today.     # Vaccinations prophylactic: s/p  shingles; s/p pneumococcal vaccination.  # Prostate cancer-s/p RP   PSA normal-continue follow-up with urology [Dr.Borden; No fu].march 2024- PSA -WNL- stable.   # DISPOSITION:  # infusion today # 3 months- MD; labs- cbc/cmp; LDH; PET prior- Dr.B.

## 2023-01-11 NOTE — Progress Notes (Signed)
 [facility] CONSULT NOTE  Patient Care Team: Trudy Dorn Lunger, MD as PCP - General (Internal Medicine) Edda Mt, MD as Consulting Physician (Otolaryngology) Rennie Patrick SAUNDERS, MD as Consulting Physician (Internal Medicine)  CHIEF COMPLAINTS/PURPOSE OF CONSULTATION: Lymphoma  #  Oncology History Overview Note  #Prostate cancer-s/p RP- Dr.Borden; July 2020- non-detectable.  #June 2021-CT scan-3.5 cm mesenteric mass [incidental]; # July 30th 2021- CT-Biopsy- # DIAGNOSIS: A. 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  Ki-67-10 to 20% [[facility] -opinion]; FLIPI- 2 [age/LDH-?213]  #PET negative thyroid nodule left side s/p Bx- benign nodule [Dr.Jeungle]  # NOV 2024 PET scan progressive disease.  DEC 2024- 8th-weekly rituximab  x 4.      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  Accompanied by his wife.  Patrick Pena 73 y.o.  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   Patient status post 3 cycles of rituximab  on a weekly basis.  No infusion reactions.  Pt has no complaints today.  Patient denies night sweats or fever. Good appetite. No swollen lymph node. Patient denies any nausea vomiting.  No fevers or chills.  Denies any abdominal pain.  No new lumps or bumps.  Review of  Systems  Constitutional:  Negative for chills, diaphoresis, fever, malaise/fatigue and weight loss.  HENT:  Negative for nosebleeds and sore throat.   Eyes:  Negative for double vision.  Respiratory:  Negative for cough, hemoptysis, sputum production, shortness of breath and wheezing.   Cardiovascular:  Negative for chest pain, palpitations, orthopnea and leg swelling.  Gastrointestinal:  Negative for abdominal pain, blood in stool, constipation, diarrhea, heartburn, melena, nausea and vomiting.  Genitourinary:  Negative for dysuria, frequency and urgency.  Musculoskeletal:  Positive for back pain and joint pain.  Skin: Negative.  Negative for itching and rash.  Neurological:  Negative for dizziness, tingling, focal weakness, weakness and headaches.  Endo/Heme/Allergies:  Does not bruise/bleed easily.  Psychiatric/Behavioral:  Negative for depression. The patient is not nervous/anxious and does not have insomnia.      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 03/13/2013   Fractured pelvis (HCC) 06/25/2019   from fall off of ladder   GERD (gastroesophageal reflux disease)    Hypercholesteremia    Hypercholesterolemia 03/13/2013   Hypertension    Prostate cancer [facility])    prostate cancer     SURGICAL HISTORY: Past Surgical History:  Procedure Laterality Date   LAPAROTOMY N/A 08/14/2019   Procedure: EXPLORATORY LAPAROTOMY EXCISION MESENTERIC MASS;  Surgeon: Dessa Reyes ORN, MD;  Location: [facility] ORS;  Service: General;  Laterality: N/A;   LYMPHADENECTOMY Bilateral 02/04/2015   Procedure: BILATERAL PELVIC LYMPHADENECTOMY;  Surgeon: Gretel Ferrara, MD;  Location:  [facility] ORS;  Service: Urology;  Laterality: Bilateral;   ROBOT ASSISTED LAPAROSCOPIC RADICAL PROSTATECTOMY N/A 02/04/2015   Procedure: XI  ROBOTIC ASSISTED LAPAROSCOPIC RADICAL PROSTATECTOMY LEVEL 2;  Surgeon: Gretel Ferrara, MD;  Location: [facility] ORS;  Service: Urology;  Laterality: N/A;    SOCIAL HISTORY: Social History   Socioeconomic History   Marital status: Married    Spouse name: Not on file   Number of children: Not on file   Years of education: Not on file   Highest education level: Not on file  Occupational History   Not on file  Tobacco Use   Smoking status: Never   Smokeless tobacco: Never  Vaping Use   Vaping status: Never Used  Substance and Sexual Activity   Alcohol use: Yes    Comment: 2-4 ounces almost every day    Drug use: No   Sexual activity: Not on file  Other Topics Concern   Not on file  Social History Narrative   Lives in [place]; curator; never smoked; whiskey every day.    Social Drivers of Corporate Investment Banker Strain: Not on file  Food Insecurity: Not on file  Transportation Needs: Not on file  Physical Activity: Not on file  Stress: Not on file  Social Connections: Not on file  Intimate Partner Violence: Not on file    FAMILY HISTORY: Family History  Problem Relation Age of Onset   Diabetes Mellitus II Other    Hyperlipidemia Other    Stroke Other    Hypertension Other    Kidney cancer Sister        survived   Hypertension Father    Prostate cancer Neg Hx    Tuberculosis Neg Hx     ALLERGIES:  is allergic to atorvastatin.  MEDICATIONS:  Current Outpatient Medications  Medication Sig Dispense Refill   aspirin  EC 81 MG tablet Take 81 mg by mouth daily. Swallow whole.     Calcium Carb-Cholecalciferol (CALCIUM 600 + D PO) Take 1 tablet by mouth 2 (two) times daily.     IVERMECTIN PO Take 12 mg by mouth in the morning and at bedtime.     Liniments (DEEP BLUE RELIEF EX) Apply 1 application topically 2 (two) times daily as needed (muscle pain.).     lisinopril  (PRINIVIL ,ZESTRIL ) 5 MG tablet TAKE 1 TABLET BY MOUTH DAILY 30 tablet 0   Multiple Vitamin (MULTIVITAMIN)  tablet Take 1 tablet by mouth daily.     Naltrexone HCl, Pain, 4.5 MG CAPS Take 1 Dose by mouth daily.     No current facility-administered medications for this visit.   Facility-Administered Medications Ordered in Other Visits  Medication Dose Route Frequency Provider Last Rate Last Admin   0.9 %  sodium chloride  infusion   Intravenous Continuous Sebron Mcmahill R, MD       acetaminophen  (TYLENOL ) tablet 650 mg  650 mg Oral Once Jalesha Plotz R, MD       dexamethasone  (DECADRON ) injection 10 mg  10 mg Intravenous Once Robin Petrakis R, MD       diphenhydrAMINE  (BENADRYL ) capsule 50 mg  50 mg Oral Once Malyn Aytes R, MD       montelukast  (SINGULAIR ) tablet 10 mg  10 mg Oral Once Darria Corvera R, MD       riTUXimab -pvvr (RUXIENCE ) 700 mg in sodium chloride  0.9 % 180 mL infusion  375 mg/m2 (Treatment Plan Recorded) Intravenous Once Brendan Gadson R, MD          .  PHYSICAL EXAMINATION: ECOG PERFORMANCE STATUS: 0 - Asymptomatic  Vitals:   01/11/23 1024  BP: 132/67  Pulse: 60  Resp: 19  Temp: 98.6 F (37 C)  SpO2: 99%    Filed Weights   01/11/23 1024  Weight: 172 lb 9.6 oz (78.3 kg)    Physical Exam Constitutional:      Comments: Accompanied by his wife.  He is walking independently.  HENT:     Head: Normocephalic and atraumatic.     Mouth/Throat:     Pharynx: No oropharyngeal exudate.  Eyes:     Pupils: Pupils are equal, round, and reactive to light.  Cardiovascular:     Rate and Rhythm: Normal rate and regular rhythm.  Pulmonary:     Effort: Pulmonary effort is normal. No respiratory distress.     Breath sounds: Normal breath sounds. No wheezing.  Abdominal:     General: Bowel sounds are normal. There is no distension.     Palpations: Abdomen is soft. There is no mass.     Tenderness: There is no abdominal tenderness. There is no guarding or rebound.  Musculoskeletal:        General: No tenderness. Normal range of motion.      Cervical back: Normal range of motion and neck supple.  Skin:    General: Skin is warm.  Neurological:     Mental Status: He is alert and oriented to person, place, and time.  Psychiatric:        Mood and Affect: Affect normal.      LABORATORY DATA:  I have reviewed the data as listed Lab Results  Component Value Date   WBC 5.5 12/21/2022   HGB 17.3 (H) 12/21/2022   HCT 51.3 12/21/2022   MCV 95.5 12/21/2022   PLT 280 12/21/2022   Recent Labs    10/05/22 0912 11/23/22 0927 12/21/22 0820  NA 131* 136 136  K 4.2 4.3 4.3  CL 99 101 98  CO2 25 25 26   GLUCOSE 100* 102* 155*  BUN 18 18 17   CREATININE 0.95 0.84 1.02  CALCIUM 8.9 8.9 9.1  GFRNONAA >60 >60 >60  PROT 6.9 6.8 6.7  ALBUMIN 4.0 3.9 3.8  AST 23 25 24   ALT 19 21 17   ALKPHOS 65 66 65  BILITOT 1.1 0.8 0.6    RADIOGRAPHIC STUDIES: I have personally reviewed the radiological images as listed and agreed with the findings in the report.  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 Patient declined- Bendamustine rituximab ; currently on rituximab  weekly x 4. Hepatitis B/C- Negative.  # proceed with rituximab  weekly # 4 of planned 4 today-reviewed the labs.  Patient tolerated treatment without any major side effects.  Discussed that we will plan imaging about 3 months post treatment-PET scan ordered today.     # Vaccinations prophylactic: s/p  shingles; s/p pneumococcal vaccination.  # Prostate cancer-s/p RP   PSA normal-continue follow-up with urology [Dr.Borden; No fu].march 2024- PSA -WNL- stable.   #  DISPOSITION:  # infusion today # 3 months- MD; labs- cbc/cmp; LDH; PET prior- Dr.B.    All questions were answered. The patient knows to call  the clinic with any problems, questions or concerns.    Patrick JONELLE Joe, MD 01/11/2023 11:02 AM

## 2023-01-12 ENCOUNTER — Other Ambulatory Visit: Payer: Self-pay

## 2023-01-16 ENCOUNTER — Emergency Department: Payer: Medicare PPO

## 2023-01-16 ENCOUNTER — Other Ambulatory Visit: Payer: Self-pay

## 2023-01-16 ENCOUNTER — Observation Stay
Admission: EM | Admit: 2023-01-16 | Discharge: 2023-01-17 | Disposition: A | Discharge status: Home / Self Care | Payer: Medicare PPO | Attending: Internal Medicine | Admitting: Internal Medicine

## 2023-01-16 DIAGNOSIS — Z8546 Personal history of malignant neoplasm of prostate: Secondary | ICD-10-CM | POA: Diagnosis not present

## 2023-01-16 DIAGNOSIS — Z8572 Personal history of non-Hodgkin lymphomas: Secondary | ICD-10-CM | POA: Diagnosis not present

## 2023-01-16 DIAGNOSIS — R9089 Other abnormal findings on diagnostic imaging of central nervous system: Secondary | ICD-10-CM | POA: Diagnosis not present

## 2023-01-16 DIAGNOSIS — Z79899 Other long term (current) drug therapy: Secondary | ICD-10-CM | POA: Diagnosis not present

## 2023-01-16 DIAGNOSIS — I10 Essential (primary) hypertension: Secondary | ICD-10-CM | POA: Insufficient documentation

## 2023-01-16 DIAGNOSIS — I6503 Occlusion and stenosis of bilateral vertebral arteries: Secondary | ICD-10-CM | POA: Diagnosis not present

## 2023-01-16 DIAGNOSIS — R200 Anesthesia of skin: Secondary | ICD-10-CM | POA: Diagnosis not present

## 2023-01-16 DIAGNOSIS — G4489 Other headache syndrome: Secondary | ICD-10-CM

## 2023-01-16 DIAGNOSIS — R202 Paresthesia of skin: Secondary | ICD-10-CM | POA: Diagnosis not present

## 2023-01-16 DIAGNOSIS — Z7982 Long term (current) use of aspirin: Secondary | ICD-10-CM | POA: Insufficient documentation

## 2023-01-16 DIAGNOSIS — I6782 Cerebral ischemia: Secondary | ICD-10-CM | POA: Diagnosis not present

## 2023-01-16 DIAGNOSIS — G459 Transient cerebral ischemic attack, unspecified: Secondary | ICD-10-CM | POA: Insufficient documentation

## 2023-01-16 DIAGNOSIS — R29818 Other symptoms and signs involving the nervous system: Secondary | ICD-10-CM | POA: Diagnosis not present

## 2023-01-16 DIAGNOSIS — G450 Vertebro-basilar artery syndrome: Secondary | ICD-10-CM

## 2023-01-16 DIAGNOSIS — I6521 Occlusion and stenosis of right carotid artery: Secondary | ICD-10-CM | POA: Diagnosis not present

## 2023-01-16 DIAGNOSIS — E041 Nontoxic single thyroid nodule: Secondary | ICD-10-CM | POA: Diagnosis not present

## 2023-01-16 DIAGNOSIS — I651 Occlusion and stenosis of basilar artery: Secondary | ICD-10-CM | POA: Diagnosis not present

## 2023-01-16 DIAGNOSIS — R519 Headache, unspecified: Secondary | ICD-10-CM | POA: Diagnosis not present

## 2023-01-16 DIAGNOSIS — R2990 Unspecified symptoms and signs involving the nervous system: Secondary | ICD-10-CM | POA: Diagnosis present

## 2023-01-16 LAB — DIFFERENTIAL
Abs Immature Granulocytes: 0.04 10*3/uL (ref 0.00–0.07)
Basophils Absolute: 0.1 10*3/uL (ref 0.0–0.1)
Basophils Relative: 1 %
Eosinophils Absolute: 0.3 10*3/uL (ref 0.0–0.5)
Eosinophils Relative: 5 %
Immature Granulocytes: 1 %
Lymphocytes Relative: 25 %
Lymphs Abs: 1.6 10*3/uL (ref 0.7–4.0)
Monocytes Absolute: 0.9 10*3/uL (ref 0.1–1.0)
Monocytes Relative: 15 %
Neutro Abs: 3.5 10*3/uL (ref 1.7–7.7)
Neutrophils Relative %: 53 %

## 2023-01-16 LAB — CBC
HCT: 50 % (ref 39.0–52.0)
Hemoglobin: 17.1 g/dL — ABNORMAL HIGH (ref 13.0–17.0)
MCH: 32.1 pg (ref 26.0–34.0)
MCHC: 34.2 g/dL (ref 30.0–36.0)
MCV: 93.8 fL (ref 80.0–100.0)
Platelets: 284 10*3/uL (ref 150–400)
RBC: 5.33 MIL/uL (ref 4.22–5.81)
RDW: 12.3 % (ref 11.5–15.5)
WBC: 6.4 10*3/uL (ref 4.0–10.5)
nRBC: 0 % (ref 0.0–0.2)

## 2023-01-16 LAB — PROTIME-INR
INR: 0.9 (ref 0.8–1.2)
Prothrombin Time: 12.7 s (ref 11.4–15.2)

## 2023-01-16 LAB — COMPREHENSIVE METABOLIC PANEL
ALT: 18 U/L (ref 0–44)
AST: 22 U/L (ref 15–41)
Albumin: 3.9 g/dL (ref 3.5–5.0)
Alkaline Phosphatase: 59 U/L (ref 38–126)
Anion gap: 12 (ref 5–15)
BUN: 19 mg/dL (ref 8–23)
CO2: 24 mmol/L (ref 22–32)
Calcium: 8.8 mg/dL — ABNORMAL LOW (ref 8.9–10.3)
Chloride: 99 mmol/L (ref 98–111)
Creatinine, Ser: 1 mg/dL (ref 0.61–1.24)
GFR, Estimated: >60 mL/min (ref >60)
Glucose, Bld: 116 mg/dL — ABNORMAL HIGH (ref 70–99)
Potassium: 3.6 mmol/L (ref 3.5–5.1)
Sodium: 135 mmol/L (ref 135–145)
Total Bilirubin: 0.9 mg/dL (ref 0.0–1.2)
Total Protein: 6.5 g/dL (ref 6.5–8.1)

## 2023-01-16 LAB — APTT: aPTT: 29 s (ref 24–36)

## 2023-01-16 LAB — CBG MONITORING, ED: Glucose-Capillary: 120 mg/dL — ABNORMAL HIGH (ref 70–99)

## 2023-01-16 LAB — ETHANOL: Alcohol, Ethyl (B): <10 mg/dL (ref <10)

## 2023-01-16 MED ORDER — IOHEXOL 350 MG/ML SOLN
75.0000 mL | Freq: Once | INTRAVENOUS | Status: AC | PRN
  Administered 2023-01-16: 75 mL via INTRAVENOUS

## 2023-01-16 MED ORDER — GADOBUTROL 1 MMOL/ML IV SOLN
7.0000 mL | Freq: Once | INTRAVENOUS | Status: AC | PRN
  Administered 2023-01-16: 7 mL via INTRAVENOUS

## 2023-01-16 MED ORDER — SODIUM CHLORIDE 0.9% FLUSH
3.0000 mL | Freq: Once | INTRAVENOUS | Status: AC
  Administered 2023-01-16: 3 mL via INTRAVENOUS

## 2023-01-16 NOTE — H&P (Signed)
 History and Physical    Patient: Patrick Pena FMW:[id number] DOB: [birth year] DOA: 01/16/2023 DOS: the patient was seen and examined on 01/16/2023 PCP: Trudy Dorn Lunger, MD  Patient coming from: Home  Chief Complaint:  Chief Complaint  Patient presents with   Numbness   HPI: Patrick Pena is a 73 y.o. male with medical history significant of hypertension, follicular lymphoma, prostate cancer presented to the emergency department for evaluation of sudden onset of headache, tingling numbness of left arm.  Symptoms started around 10:45 AM per family while he was driving.  Denies prior episodes of similar symptoms.  No focal weakness, facial droop, slurred speech.  No fever, chills, rigors.  Denies chest pain, shortness of breath or palpitations.  No abdominal pain, nausea or vomiting.  Patient symptoms lasted for few hours but mild while in ED and now during my exam completely resolved.  Upon arrival to the emergency department patient was hemodynamically stable.  CT head no acute abnormality. 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  Multifocal moderate stenosis and basilar artery.  No LVO. MRI brain showed no evidence of acute intracranial abnormality, chronic lacunar infarct in left periatrial white matter, chronic small vessel disease noted.  EDP discussed case with neurologist who recommended hospitalist admission for TIA evaluation.  Review of Systems: As mentioned in the history of present illness. All other systems reviewed and are negative.  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   Decrease in the ability to hear 03/13/2013   Fractured pelvis (HCC) 06/25/2019   from fall off of  ladder   GERD (gastroesophageal reflux disease)    Hypercholesteremia    Hypercholesterolemia 03/13/2013   Hypertension    Prostate cancer [facility])    prostate cancer    Past Surgical History:  Procedure Laterality Date   LAPAROTOMY N/A 08/14/2019   Procedure: EXPLORATORY LAPAROTOMY EXCISION MESENTERIC MASS;  Surgeon: Dessa Reyes ORN, MD;  Location: [facility] ORS;  Service: General;  Laterality: N/A;   LYMPHADENECTOMY Bilateral 02/04/2015   Procedure: BILATERAL PELVIC LYMPHADENECTOMY;  Surgeon: Gretel Ferrara, MD;  Location: [facility] ORS;  Service: Urology;  Laterality: Bilateral;   ROBOT ASSISTED LAPAROSCOPIC RADICAL PROSTATECTOMY N/A 02/04/2015   Procedure: XI ROBOTIC ASSISTED LAPAROSCOPIC RADICAL PROSTATECTOMY LEVEL 2;  Surgeon: Gretel Ferrara, MD;  Location: [facility] ORS;  Service: Urology;  Laterality: N/A;   Social History:  reports that he has never smoked. He has never used smokeless tobacco. He reports current alcohol use. He reports that he does not use drugs.  Allergies  Allergen Reactions   Atorvastatin     Headache, Muscle Pain     Family History  Problem Relation Age of Onset   Diabetes Mellitus II Other    Hyperlipidemia Other    Stroke Other    Hypertension Other    Kidney cancer Sister        survived   Hypertension Father    Prostate cancer Neg Hx    Tuberculosis Neg Hx   Father had stroke around age of 60. Mother history of breast cancer.  Prior to Admission medications   Medication Sig Start Date End Date Taking? Authorizing Provider  aspirin  EC 81 MG tablet Take 81 mg by mouth  daily. Swallow whole.   Yes [provider]  Calcium Carb-Cholecalciferol (CALCIUM 600 + D PO) Take 1 tablet by mouth 2 (two) times daily.   Yes [provider]  IVERMECTIN PO Take 12 mg by mouth in the morning and at bedtime.   Yes [provider]  Liniments (DEEP BLUE RELIEF EX) Apply 1 application topically 2 (two) times daily as needed (muscle pain.).   Yes [provider]  lisinopril  (PRINIVIL ,ZESTRIL ) 5 MG tablet TAKE 1 TABLET BY MOUTH DAILY 11/04/14  Yes Jones, Deanna C, MD  Multiple Vitamin (MULTIVITAMIN) tablet Take 1 tablet by mouth daily.   Yes [provider]  Naltrexone HCl, Pain, 4.5 MG CAPS Take 1 Dose by mouth daily.   Yes [provider]    Physical Exam: Vitals:   01/16/23 1207 01/16/23 1208 01/16/23 1235 01/16/23 1330  BP:  (!) 159/83 (!) 152/54 136/62  Pulse:  (!) 58 64 61  Resp: 18  12 15   Temp:  97.6 F (36.4 C)    SpO2:  100%    Weight: 78 kg     Height: 5' 8 (1.727 m)      General - Elderly Caucasian male, no apparent distress HEENT - PERRLA, EOMI, atraumatic head, non tender sinuses. Lung - Clear, rales, rhonchi, wheezes. Heart - S1, S2 heard, no murmurs, rubs, trace pedal edema. Abdomen-soft, nontender, nondistended.  Bowel sounds good. Neuro - Alert, awake and oriented x 3, non focal exam. Skin - Warm and dry. Data Reviewed:     Latest Ref Rng & Units 01/16/2023   12:06 PM 12/21/2022    8:20 AM 11/23/2022    9:27 AM  CBC  WBC 4.0 - 10.5 K/uL 6.4  5.5  5.6   Hemoglobin 13.0 - 17.0 g/dL 82.8  82.6  82.5   Hematocrit 39.0 - 52.0 % 50.0  51.3  52.0   Platelets 150 - 400 K/uL 284  280  273        Latest Ref Rng & Units 01/16/2023   12:06 PM 12/21/2022    8:20 AM 11/23/2022    9:27 AM  BMP  Glucose 70 - 99 mg/dL 883  844  897   BUN 8 - 23 mg/dL 19  17  18    Creatinine 0.61 - 1.24 mg/dL 8.99  8.97  9.15   Sodium 135 - 145 mmol/L 135  136  136   Potassium 3.5 - 5.1 mmol/L 3.6  4.3  4.3   Chloride 98 - 111 mmol/L 99  98  101   CO2 22 - 32 mmol/L 24  26  25    Calcium 8.9 - 10.3 mg/dL 8.8  9.1  8.9    MR BRAIN W WO CONTRAST Result Date: 01/16/2023 CLINICAL DATA:  Provided history: Transient ischemic attack (TIA). Stroke, follow-up. Neuro deficit, acute, stroke suspected. Right-sided numbness. History of malignancy. EXAM: MRI HEAD WITHOUT AND WITH CONTRAST TECHNIQUE: Multiplanar, multiecho  pulse sequences of the brain and surrounding structures were obtained without and with intravenous contrast. CONTRAST:  7mL GADAVIST  GADOBUTROL  1 MMOL/ML IV SOLN COMPARISON:  Non-contrast head CT and CT angiogram head/neck performed earlier today 01/16/2023. FINDINGS: Brain: Mild generalized parenchymal volume loss. Commensurate prominence of the ventricles and sulci. Chronic lacunar infarct within the left periatrial white matter. Mild-to-moderate multifocal T2 FLAIR hyperintense signal abnormality elsewhere within the cerebral white matter, nonspecific but compatible chronic small vessel ischemic disease. There is no acute infarct. No evidence of an intracranial mass. No extra-axial fluid  collection. No midline shift. No pathologic intracranial enhancement identified. Vascular: Please refer to the CTA head/neck performed earlier today. Skull and upper cervical spine: No focal worrisome marrow lesion. Sinuses/Orbits: No mass or acute finding within the imaged orbits. Small mucous retention cyst within the left sphenoid sinus. IMPRESSION: 1.  No evidence of an acute intracranial abnormality. 2. No evidence of intracranial metastatic disease. 3. Chronic lacunar infarct within the left periatrial white matter. 4. Background mild-to-moderate cerebral white matter chronic small vessel ischemic disease. 5. Mild generalized parenchymal atrophy. Electronically Signed   By: Rockey Childs D.O.   On: 01/16/2023 13:37   CT ANGIO HEAD NECK W WO CM (CODE STROKE) Result Date: 01/16/2023 CLINICAL DATA:  Right-sided numbness, thunderclap headache, stroke suspected EXAM: CT ANGIOGRAPHY HEAD AND NECK WITH AND WITHOUT CONTRAST TECHNIQUE: Multidetector CT imaging of the head and neck was performed using the standard protocol during bolus administration of intravenous contrast. Multiplanar CT image reconstructions and MIPs were obtained to evaluate the vascular anatomy. Carotid stenosis measurements (when applicable) are obtained  utilizing NASCET criteria, using the distal internal carotid diameter as the denominator. RADIATION DOSE REDUCTION: This exam was performed according to the departmental dose-optimization program which includes automated exposure control, adjustment of the mA and/or kV according to patient size and/or use of iterative reconstruction technique. CONTRAST:  75mL OMNIPAQUE  IOHEXOL  350 MG/ML SOLN COMPARISON:  01/16/2023 CT head, no prior CTA available FINDINGS: CT HEAD FINDINGS For noncontrast findings, please see same day CT head. CTA NECK FINDINGS Aortic arch: Standard branching. Imaged portion shows no evidence of aneurysm or dissection. No significant stenosis of the major arch vessel origins. Right carotid system: 50% stenosis in the proximal right ICA (series 6, image 216). No evidence of dissection or occlusion. Left carotid system: No evidence of dissection, occlusion, or hemodynamically significant stenosis (greater than 50%). Atherosclerotic disease at the bifurcation and in the proximal ICA is not hemodynamically significant. Vertebral arteries: Severe stenosis at the origin of the left vertebral artery, which is very poorly opacified throughout its course. Severe stenosis at the origin of the right vertebral artery, which is poorly opacified throughout the V1 and V2 segments, likely reflect severe long segment stenosis given the appearance of the distal V3 segment. Improved opacification in the proximal V3 segment (series 6, image 191), although there is multifocal severe stenosis. More robust opacification of the distal right V3 (series 6, image 172), with additional superimposed moderate stenosis (series 6, image 173). Skeleton: No acute osseous abnormality. Degenerative changes in the cervical spine. Other neck: 2.7 cm partially calcified left thyroid nodule, which was most recently evaluated with ultrasound on 09/04/2019. Upper chest: No focal pulmonary opacity or pleural effusion. Review of the MIP  images confirms the above findings CTA HEAD FINDINGS Anterior circulation: Both internal carotid arteries are patent to the termini, with moderate stenosis terminal right ICA (series 6, image 104). A1 segments patent. Normal anterior communicating artery. Anterior cerebral arteries are patent to their distal aspects without significant stenosis. No M1 stenosis or occlusion. Mild stenosis in the proximal more anterior left M2 (series 6, image 101). MCA branches otherwise perfused to their distal aspects without significant stenosis. Posterior circulation: Nonopacification of proximal left V4 (series 6, image 171), with some reconstitution (series 6, image 166). There is likely retrograde opacification of the mid to distal left V4 (series 6, image 158), with opacification of the left PICA origin. The right vertebral artery demonstrates mild multifocal stenosis (series 6, image 159), but is patent to the  vertebrobasilar junction. Posterior inferior cerebellar arteries patent proximally. Basilar patent to its distal aspect with multifocal moderate stenosis (series 6, images [id number]). Superior cerebellar arteries patent proximally. Severe stenosis in the proximal left P1 (series 6, image 105). Patent left P1. Patent bilateral posterior communicating arteries. Severe stenosis in the proximal right P2 (series 6, image 102). Venous sinuses: Not well opacified due to phase of timing. Anatomic variants: None significant. No evidence of aneurysm or vascular malformation. Review of the MIP images confirms the above findings IMPRESSION: 1. 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 There is nonopacification of the proximal left V4, with some reconstitution, and likely retrograde opacification of the mid to distal left V4, including the left PICA origin. 2. Severe stenosis at the origin of the right vertebral artery, which is poorly opacified throughout the V1  and V2 segments, likely reflecting severe long segment stenosis given the appearance of the distal V3 segment. Improved opacification in the proximal V3 segment, although there is multifocal severe stenosis. More robust opacification of the distal right V3, with additional superimposed moderate stenosis. 3. 50% stenosis in the proximal right ICA. 4. Moderate stenosis in the terminal right ICA. Mild stenosis in the left M2 branch. 5. Severe stenosis in the proximal left P1 and proximal right P2. 6. Multifocal moderate stenosis in the basilar artery. These results were called by telephone at the time of interpretation on 01/16/2023 at 12:44 pm to provider [facility] , who verbally acknowledged these results. Electronically Signed   By: Donald Campion M.D.   On: 01/16/2023 12:44   CT HEAD CODE STROKE WO CONTRAST Result Date: 01/16/2023 CLINICAL DATA:  Code stroke.  Neuro deficit, acute, stroke suspected EXAM: CT HEAD WITHOUT CONTRAST TECHNIQUE: Contiguous axial images were obtained from the base of the skull through the vertex without intravenous contrast. RADIATION DOSE REDUCTION: This exam was performed according to the departmental dose-optimization program which includes automated exposure control, adjustment of the mA and/or kV according to patient size and/or use of iterative reconstruction technique. COMPARISON:  CT head March 26, 2012. FINDINGS: Brain: No evidence of acute large vascular territory infarction, hemorrhage, hydrocephalus, extra-axial collection or mass lesion/mass effect. Vascular: No hyperdense vessel. Skull: No acute fracture. Sinuses/Orbits: Clear sinuses.  No acute orbital findings. Other: No mastoid effusions. ASPECTS [facility] Stroke Program Early CT Score) Total score (0-10 with 10 being normal): 10 IMPRESSION: 1. No evidence of acute intracranial abnormality. 2. ASPECTS is 10. Code stroke imaging results were communicated on 01/16/2023 at 12:19 pm to provider Dr. Matthews via secure text  paging. Electronically Signed   By: Gilmore GORMAN Molt M.D.   On: 01/16/2023 12:19     Assessment and Plan: Patrick Pena is a 73 y.o. male with medical history significant of hypertension, follicular lymphoma, prostate cancer presented to the emergency department for evaluation of sudden onset of headache, tingling numbness of left arm.  He is currently being admitted for stroke workup as per neurology recommendations.  Plan: TIA CTA head and neck revealed significant disease of vertebrobasilar system. MRI brain showed no acute intracranial abnormality. Neurologist advised evaluation of TIA. Admit the patient for TIA, stroke workup. Continue to monitor neurochecks per floor protocol. Check lipid profile, hemoglobin A1c. Echocardiogram with bubble study ordered. Will follow neurology recommendations. PT OT evaluation. Patient is able to tolerate diet. Patient will be continued on aspirin  therapy. Continue permissive hypertension.  Hypertension: Blood pressure stable. Will resume home medication  lisinopril  from tomorrow.  History of follicular lymphoma- States she finished 3 cycles of rituximab . Follow oncology as outpatient.    Advance Care Planning:   Code Status: Full Code Discussed with patient.  Consults: Neurology  Family Communication: Discussed with patient and his son at bedside.  They understand agree.  Severity of Illness: The appropriate patient status for this patient is OBSERVATION. Observation status is judged to be reasonable and necessary in order to provide the required intensity of service to ensure the patient's safety. The patient's presenting symptoms, physical exam findings, and initial radiographic and laboratory data in the context of their medical condition is felt to place them at decreased risk for further clinical deterioration. Furthermore, it is anticipated that the patient will be medically stable for discharge from the hospital within 2 midnights of  admission.   Author: Concepcion Riser, MD 01/16/2023 4:36 PM  For on call review [email or web address].

## 2023-01-16 NOTE — Progress Notes (Signed)
 CODE STROKE- PHARMACY COMMUNICATION   Time CODE STROKE called/page received:1/14 @ 1209  Time response to CODE STROKE was made (in person or via phone): in-person   Time Stroke Kit retrieved from [employer] (only if needed): TNK not indicated   Name of Provider/Nurse contacted: Dr. 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 Authorizing Provider  aspirin  EC 81 MG tablet Take 81 mg by mouth daily. Swallow whole.    [provider]  Calcium Carb-Cholecalciferol (CALCIUM 600 + D PO) Take 1 tablet by mouth 2 (two) times daily.    [provider]  IVERMECTIN PO Take 12 mg by mouth in the morning and at bedtime.    [provider]  Liniments (DEEP BLUE RELIEF EX) Apply 1 application topically 2 (two) times daily as needed (muscle pain.).    [provider]  lisinopril  (PRINIVIL ,ZESTRIL ) 5 MG tablet TAKE 1 TABLET BY MOUTH DAILY 11/04/14   Jones, Deanna C, MD  Multiple Vitamin (MULTIVITAMIN) tablet Take 1 tablet by mouth daily.    [provider]  Naltrexone HCl, Pain, 4.5 MG CAPS Take 1 Dose by mouth daily.    [provider]   Alfonso MARLA Buys, PharmD Pharmacy Resident  01/16/2023 12:22 PM

## 2023-01-16 NOTE — ED Triage Notes (Signed)
Cbg 120 ?

## 2023-01-16 NOTE — ED Notes (Signed)
 Called [facility] at 12:10 pm spoke to rep. Greggory Stallion gave him the pt's LKW and Symptoms. rep stated he was paging out the code stroke.

## 2023-01-16 NOTE — ED Provider Notes (Signed)
 [facility] Provider Note    Event Date/Time   First MD Initiated Contact with Patient 01/16/23 1211     (approximate)   History   Numbness   HPI  Patrick Pena is a 73 year old male with history of HTN presenting to the emergency department for evaluation of headache and left-sided numbness.  Last known well initially reported as 1130, on my reevaluation family thinks it may have been closer to 1045.  Patient reports onset of sudden headache, described as a lightning sensation.  He additionally reports decreased sensation over the left side of his body most notably in his left arm.  Patient was activated as a code stroke prior to presentation.    Physical Exam   Triage Vital Signs: ED Triage Vitals  Encounter Vitals Group     BP 01/16/23 1208 (!) 159/83     Systolic BP Percentile --      Diastolic BP Percentile --      Pulse Rate 01/16/23 1208 (!) 58     Resp 01/16/23 1207 18     Temp 01/16/23 1208 97.6 F (36.4 C)     Temp src --      SpO2 01/16/23 1208 100 %     Weight 01/16/23 1207 171 lb 15.3 oz (78 kg)     Height 01/16/23 1207 5' 8 (1.727 m)     Head Circumference --      Peak Flow --      Pain Score 01/16/23 1207 9     Pain Loc --      Pain Education --      Exclude from Growth Chart --     Most recent vital signs: Vitals:   01/16/23 1235 01/16/23 1330  BP: (!) 152/54 136/62  Pulse: 64 61  Resp: 12 15  Temp:    SpO2:       General: Awake, interactive  CV:  Regular rate, good peripheral perfusion.  Resp:  Unlabored respirations, lungs clear to auscultation Abd:  Nondistended, soft, nontender to palpation Neuro:  On exam following completion of imaging, keenly aware, correctly answers month and age, able to blink eyes and squeeze hands, normal horizontal extraocular movements, no visual field loss, normal facial symmetry, no arm or leg motor drift, no limb ataxia, normal sensation, no aphasia, no dysarthria, no  inattention    ED Results / Procedures / Treatments   Labs (all labs ordered are listed, but only abnormal results are displayed) Labs Reviewed  CBC - Abnormal; Notable for the following components:      Result Value   Hemoglobin 17.1 (*)    All other components within normal limits  COMPREHENSIVE METABOLIC PANEL - Abnormal; Notable for the following components:   Glucose, Bld 116 (*)    Calcium 8.8 (*)    All other components within normal limits  CBG MONITORING, ED - Abnormal; Notable for the following components:   Glucose-Capillary 120 (*)    All other components within normal limits  DIFFERENTIAL  ETHANOL  PROTIME-INR  APTT     EKG EKG independently reviewed interpreted by myself (ER attending) demonstrates:  EKG demonstrate sinus rhythm rate of 60, PR 187, QRS 125, QTc 448, no acute ST changes  RADIOLOGY Imaging independently reviewed and interpreted by myself demonstrates:  CT head without acute bleed CTA without LVO, but does demonstrate stenosis through large portion of posterior circulation MRI brain without acute infarct  PROCEDURES:  Critical Care performed: No  Procedures  MEDICATIONS ORDERED IN ED: Medications  sodium chloride  flush (NS) 0.9 % injection 3 mL (3 mLs Intravenous Given 01/16/23 1215)  iohexol  (OMNIPAQUE ) 350 MG/ML injection 75 mL (75 mLs Intravenous Contrast Given 01/16/23 1220)  gadobutrol  (GADAVIST ) 1 MMOL/ML injection 7 mL (7 mLs Intravenous Contrast Given 01/16/23 1317)     IMPRESSION / MDM / ASSESSMENT AND PLAN / ED COURSE  I reviewed the triage vital signs and the nursing notes.  Differential diagnosis includes, but is not limited to, CVA, TIA, complex migraine, intracranial bleed, electrolyte abnormality  Patient's presentation is most consistent with acute presentation with potential threat to life or bodily function.  72 year old male presenting with headache and left-sided numbness as a code stroke.  Labs reassuring.   Imaging notes extensive disease on CTA, but no LVO.  MRI brain without acute findings.  Case was reviewed with Dr. Matthews with neurology.  Given significant disease of his vertebrobasilar system, she did recommend monitoring in the ER for 4-1/2 hours from last known well.  If patient were to develop worsening symptoms, did recommend activation of code stroke.  After this, did recommend admission to hospitalist team for further evaluation for TIA.  Patient was serially reevaluated.  Initially reported near complete resolution of his symptoms, by reevaluation around 4 PM, he reported 100% improvement in his symptoms.  As he is now outside the TNK window, do think he is stable for admission to hospitalist team for further management of his suspected TIA.  Will reach out to hospitalist team.      FINAL CLINICAL IMPRESSION(S) / ED DIAGNOSES   Final diagnoses:  Acute nonintractable headache, unspecified headache type  Left sided numbness     Rx / DC Orders   ED Discharge Orders     None        Note:  This document was prepared using Dragon voice recognition software and may include unintentional dictation errors.   Levander Slate, MD 01/16/23 [phone]

## 2023-01-16 NOTE — Consult Note (Signed)
 NEUROLOGY CONSULT NOTE   Date of service: January 16, 2023 Patient Name: Patrick Pena MRN:  [id number] DOB:  [birth year] Chief Complaint:  Requesting Provider: Darci Pore, MD  History of Present Illness   This is a 73 year old gentleman with past medical history significant for hypertension, follicular lymphoma, prostate cancer who presented to the ED for evaluation of sudden onset headache and tingling of his left arm.  Last known well was 10:45 AM today.  His headache had subsided significantly during the stroke code.  His NIH stroke scale was 1 for paresthesias.  CT head showed no acute abnormality on personal review.  TNK was not administered due to mild and improving symptoms.  His symptoms did fully resolve within a few hours.  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  There is no emergent LVO.  MRI brain showed no evidence of acute ischemia but did show chronic lacunar infarct in the left periatrial white matter as well as chronic small vessel disease.  All CNS imaging was personally reviewed.  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   Decrease in the ability to hear 03/13/2013   Fractured pelvis (HCC) 06/25/2019   from fall off of ladder   GERD (gastroesophageal reflux disease)    Hypercholesteremia     Hypercholesterolemia 03/13/2013   Hypertension    Prostate cancer [facility])    prostate cancer     Past Surgical History:  Procedure Laterality Date   LAPAROTOMY N/A 08/14/2019   Procedure: EXPLORATORY LAPAROTOMY EXCISION MESENTERIC MASS;  Surgeon: Dessa Reyes ORN, MD;  Location: [facility] ORS;  Service: General;  Laterality: N/A;   LYMPHADENECTOMY Bilateral 02/04/2015   Procedure: BILATERAL PELVIC LYMPHADENECTOMY;  Surgeon: Gretel Ferrara, MD;  Location: [facility] ORS;  Service: Urology;  Laterality: Bilateral;   ROBOT ASSISTED LAPAROSCOPIC RADICAL PROSTATECTOMY N/A 02/04/2015   Procedure: XI ROBOTIC ASSISTED LAPAROSCOPIC RADICAL PROSTATECTOMY LEVEL 2;  Surgeon: Gretel Ferrara, MD;  Location: [facility] ORS;  Service: Urology;  Laterality: N/A;    Family History: Family History  Problem Relation Age of Onset   Diabetes Mellitus II Other    Hyperlipidemia Other    Stroke Other    Hypertension Other    Kidney cancer Sister        survived   Hypertension Father    Prostate cancer Neg Hx    Tuberculosis Neg Hx     Social History  reports that he has never smoked. He has never used smokeless tobacco. He reports current alcohol use. He reports that he does not use drugs.  Allergies  Allergen Reactions   Atorvastatin     Headache, Muscle Pain     Medications  No current facility-administered medications for this encounter.  Current Outpatient Medications:    aspirin  EC 81 MG tablet, Take 81 mg by mouth daily.  Swallow whole., Disp: , Rfl:    Calcium Carb-Cholecalciferol (CALCIUM 600 + D PO), Take 1 tablet by mouth 2 (two) times daily., Disp: , Rfl:    IVERMECTIN PO, Take 12 mg by mouth in the morning and at bedtime., Disp: , Rfl:    Liniments (DEEP BLUE RELIEF EX), Apply 1 application topically 2 (two) times daily as needed (muscle pain.)., Disp: , Rfl:    lisinopril  (PRINIVIL ,ZESTRIL ) 5 MG tablet, TAKE 1 TABLET BY MOUTH DAILY, Disp: 30 tablet, Rfl: 0   Multiple Vitamin (MULTIVITAMIN) tablet, Take 1  tablet by mouth daily., Disp: , Rfl:    Naltrexone HCl, Pain, 4.5 MG CAPS, Take 1 Dose by mouth daily., Disp: , Rfl:   Vitals   Vitals:   2023-01-27 1600 January 27, 2023 1700 Jan 27, 2023 1730 01-27-23 1801  BP: (!) 147/58 (!) 147/59 (!) 120/59 134/66  Pulse: 67 65 73 64  Resp: 17 19 17 12   Temp:      SpO2:      Weight:      Height:        Body mass index is 26.15 kg/m.  Physical Exam   Constitutional: Appears well-developed and well-nourished.  Psych: Affect appropriate to situation.  Eyes: No scleral injection.  HENT: No OP obstruction.  Head: Normocephalic.  Cardiovascular: Normal rate and regular rhythm.  Respiratory: Effort normal, non-labored breathing.  GI: Soft.  No distension. There is no tenderness.  Skin: WDI.   Neurologic Examination   *MS: A&O x4. Follows multi-step commands.  *Speech: no dysarthria or aphasia, able to name and repeat. *CN:    I: Deferred   II,III: PERRLA, VFF by confrontation, optic discs not visualized 2/2 pupillary constriction   III,IV,VI: EOMI w/o nystagmus, no ptosis   V: Sensation intact from V1 to V3 to LT   VII: Eyelid closure was full.  Smile symmetric.   VIII: Hearing intact to voice   IX,X: Voice normal, palate elevates symmetrically    XI: SCM/trap 5/5 bilat   XII: Tongue protrudes midline, no atrophy or fasciculations  *Motor:   Normal bulk.  No tremor, rigidity or bradykinesia. No pronator drift.   Strength: Dlt Bic Tri WE WrF FgS Gr HF KnF KnE PlF DoF    Left 5 5 5 5 5 5 5 5 5 5 5 5     Right 5 5 5 5 5 5 5 5 5 5 5 5    *Sensory: LUE paresthesias. No double-simultaneous extinction.  *Coordination:  Finger-to-nose, heel-to-shin, rapid alternating motions were intact. *Reflexes:  2+ and symmetric throughout without clonus; toes down-going bilat *Gait: deferred   Labs/Imaging/Neurodiagnostic studies   CBC:  Recent Labs  Lab 27-Jan-2023 1206  WBC 6.4  NEUTROABS 3.5  HGB 17.1*  HCT 50.0  MCV 93.8  PLT 284   Basic Metabolic  Panel:  Lab Results  Component Value Date   NA 135 01-27-23   K 3.6 01-27-23   CO2 24 01/27/2023   GLUCOSE 116 (H) 01/27/23   BUN 19 01-27-2023   CREATININE 1.00 2023/01/27   CALCIUM 8.8 (L) 2023/01/27   GFRNONAA >60 27-Jan-2023   GFRAA >60 06/25/2019   Lipid Panel:  Lab Results  Component Value Date   LDLCALC 78 01/27/2014   HgbA1c:  Lab Results  Component Value Date   HGBA1C 5.9 03/26/2012   Urine Drug Screen: No results found for: LABOPIA, COCAINSCRNUR, LABBENZ, AMPHETMU, THCU, LABBARB  Alcohol Level     Component Value Date/Time   ETH <10 27-Jan-2023 1206  INR  Lab Results  Component Value Date   INR 0.9 01/16/2023   APTT  Lab Results  Component Value Date   APTT 29 01/16/2023   AED levels: No results found for: PHENYTOIN, ZONISAMIDE, LAMOTRIGINE, LEVETIRACETA  CT Head without contrast(Personally reviewed): 1. No evidence of acute intracranial abnormality. 2. ASPECTS is 10.  CT angio Head and Neck with contrast(Personally reviewed): 1. 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 There is nonopacification of the proximal left V4, with some reconstitution, and likely retrograde opacification of the mid to distal left V4, including the left PICA origin. 2. Severe stenosis at the origin of the right vertebral artery, which is poorly opacified throughout the V1 and V2 segments, likely reflecting severe long segment stenosis given the appearance of the distal V3 segment. Improved opacification in the proximal V3 segment, although there is multifocal severe stenosis. More robust opacification of the distal right V3, with additional superimposed moderate stenosis. 3. 50% stenosis in the proximal right ICA. 4. Moderate stenosis in the terminal right ICA. Mild stenosis in the left M2 branch. 5. Severe stenosis in the proximal left P1 and proximal right P2. 6.  Multifocal moderate stenosis in the basilar artery.  MRI Brain wwo (Personally reviewed): 1.  No evidence of an acute intracranial abnormality. 2. No evidence of intracranial metastatic disease. 3. Chronic lacunar infarct within the left periatrial white matter. 4. Background mild-to-moderate cerebral white matter chronic small vessel ischemic disease. 5. Mild generalized parenchymal atrophy.   ASSESSMENT   This is a 73 year old gentleman with past medical history significant for hypertension, follicular lymphoma, prostate cancer who presented to the ED for evaluation of sudden onset headache and tingling of his left arm, both now resolved. 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 No acute infarct on MRI.  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

## 2023-01-16 NOTE — ED Notes (Signed)
 Pt to CT

## 2023-01-16 NOTE — Progress Notes (Signed)
 Code stroke activated at 1211. Pt in CT upon activation. Dr. Selina Cooley arrived in CT at 1214 for assessment. NIHSS 1 per Dr. Selina Cooley, no TNK at this time. TSRN dismissed from telestroke cart by Dr. Selina Cooley.

## 2023-01-16 NOTE — Code Documentation (Signed)
 Stroke Response Nurse Documentation Code Documentation  Patrick Pena is a 73 y.o. male arriving to [facility] via [employer] on 01/16/2023 with past medical hx of HTN, hypercholesteremia, GERD, prostate cancer, hard of hearing. On aspirin  81 mg daily. Code stroke was activated by ED.   Patient from home where he was LKW at 1130 and now complaining of left sided decreased sensation. Pt reports sudden onset headache and left sided decreased sensation at 1130. Brought to ED by wife.   Stroke team at the bedside on patient arrival. Labs drawn and patient cleared for CT by Dr. Levander. Patient to CT with team. NIHSS 1, see documentation for details and code stroke times. Patient with left decreased sensation on exam. The following imaging was completed:  CT Head and CTA. Patient is not a candidate for IV Thrombolytic due to too mild to treat, per MD. Patient is not a candidate for IR due to no LVO on imaging, per MD.   Care Plan: q30 min NIHSS and VS until outside window at 1600 followed by q2h NIHSS and vital signs. Swallow screen per protocol  Bedside handoff with ED RN Laneta JONELLE and Olam JUDITHANN Burnard KANDICE Hershel  Stroke Response RN

## 2023-01-16 NOTE — ED Triage Notes (Signed)
 Pt to ED for headache and left arm numbness started 45 mins ago.

## 2023-01-17 DIAGNOSIS — G450 Vertebro-basilar artery syndrome: Secondary | ICD-10-CM

## 2023-01-17 DIAGNOSIS — R519 Headache, unspecified: Secondary | ICD-10-CM

## 2023-01-17 DIAGNOSIS — R200 Anesthesia of skin: Secondary | ICD-10-CM | POA: Diagnosis not present

## 2023-01-17 DIAGNOSIS — R2990 Unspecified symptoms and signs involving the nervous system: Secondary | ICD-10-CM | POA: Diagnosis not present

## 2023-01-17 LAB — LIPID PANEL
Cholesterol: 298 mg/dL — ABNORMAL HIGH (ref 0–200)
HDL: 50 mg/dL (ref >40)
LDL Cholesterol: 195 mg/dL — ABNORMAL HIGH (ref 0–99)
Total CHOL/HDL Ratio: 6 {ratio}
Triglycerides: 264 mg/dL — ABNORMAL HIGH (ref <150)
VLDL: 53 mg/dL — ABNORMAL HIGH (ref 0–40)

## 2023-01-17 LAB — HEMOGLOBIN A1C
Hgb A1c MFr Bld: 5.5 % (ref 4.8–5.6)
Mean Plasma Glucose: 111.15 mg/dL

## 2023-01-17 LAB — SEDIMENTATION RATE: Sed Rate: 3 mm/h (ref 0–20)

## 2023-01-17 MED ORDER — CLOPIDOGREL BISULFATE 75 MG PO TABS: 75.0000 mg | ORAL_TABLET | Freq: Every day | ORAL | 2 refills | Status: AC

## 2023-01-17 MED ORDER — ACETAMINOPHEN 160 MG/5ML PO SOLN: 650.0000 mg | ORAL | Status: DC | PRN

## 2023-01-17 MED ORDER — ASPIRIN 81 MG PO TBEC
81.0000 mg | DELAYED_RELEASE_TABLET | Freq: Every day | ORAL | Status: DC
  Filled 2023-01-17: qty 1

## 2023-01-17 MED ORDER — ASPIRIN EC 81 MG PO TBEC: 81.0000 mg | DELAYED_RELEASE_TABLET | Freq: Every day | ORAL | 0 refills | Status: AC

## 2023-01-17 MED ORDER — ENOXAPARIN SODIUM 40 MG/0.4ML IJ SOSY
40.0000 mg | PREFILLED_SYRINGE | INTRAMUSCULAR | Status: DC
  Filled 2023-01-17: qty 0.4

## 2023-01-17 MED ORDER — SENNOSIDES-DOCUSATE SODIUM 8.6-50 MG PO TABS: 1.0000 | ORAL_TABLET | Freq: Every evening | ORAL | Status: DC | PRN

## 2023-01-17 MED ORDER — ACETAMINOPHEN 325 MG PO TABS: 650.0000 mg | ORAL_TABLET | ORAL | Status: DC | PRN

## 2023-01-17 MED ORDER — STROKE: EARLY STAGES OF RECOVERY BOOK: Freq: Once | Status: DC

## 2023-01-17 MED ORDER — ACETAMINOPHEN 325 MG RE SUPP: 650.0000 mg | RECTAL | Status: DC | PRN

## 2023-01-17 MED ORDER — LISINOPRIL 5 MG PO TABS
5.0000 mg | ORAL_TABLET | Freq: Every day | ORAL | Status: DC
  Filled 2023-01-17: qty 1

## 2023-01-17 MED ORDER — ADULT MULTIVITAMIN W/MINERALS CH
1.0000 | ORAL_TABLET | Freq: Every day | ORAL | Status: DC
  Filled 2023-01-17: qty 1

## 2023-01-17 NOTE — Progress Notes (Signed)
 SLP Cancellation Note  Patient Details Name: [name] MRN: [id number] DOB: 04-29-1950   Cancelled treatment:       Reason Eval/Treat Not Completed: SLP screened, no needs identified, will sign off (chart reviewed; consulted NSG and met w/ pt/Wife in room.)  Pt denied any difficulty swallowing and is currently on a regular diet; tolerates swallowing pills w/ water  per NSG. Pt and Wife eating [employer] in room upon entering.  Pt conversed in full conversation w/out expressive/receptive deficits noted; pt denied any speech-language deficits. Speech clear. HOH at Baseline. No further skilled ST services indicated as pt appears at his baseline. Pt agreed. NSG to reconsult if any change in status while admitted.     Darla Edward, MS, CCC-SLP Speech Language Pathologist Rehab Services; [facility] [phone] (ascom) Davian Hanshaw 01/17/2023, 11:54 AM

## 2023-01-17 NOTE — Evaluation (Signed)
 Physical Therapy Evaluation Patient Details Name: Patrick Pena MRN: [id number] DOB: 1950-06-04 Today's Date: 01/17/2023  History of Present Illness  Patrick Pena is a 72yoM who comes to [facility] for acute onset LUE paresthesias and HA. Pt here under code stroke- MRI negative. Pt seen by neurology already.  Clinical Impression  Pt awake, agreeable to session, SIL at bedside. Pt reports full resolution of all symptoms. Pt denies any involvement of gross mobility, BLE, balance, strength, etc. Pt able to demonstrate independent mobiilty this date without symptoms, LOB. No skilled PT needs at this time. PT signing off. Pt also reports ad lib use of LUE in functional activity today, meal set up, packs/straws opening, denies any abnormal acute difficulty with LUE, no indication for OT needs at this time per pt report.       If plan is discharge home, recommend the following:     Can travel by private vehicle        Equipment Recommendations None recommended by PT  Recommendations for Other Services       Functional Status Assessment Patient has not had a recent decline in their functional status     Precautions / Restrictions Precautions Precautions: None      Mobility  Bed Mobility Overal bed mobility: Independent                  Transfers Overall transfer level: Independent Equipment used: None                    Ambulation/Gait Ambulation/Gait assistance: Independent Gait Distance (Feet): 250 Feet Assistive device: None   Gait velocity: >1.56m/s     General Gait Details: performs 60ft AMB with eyes closed during this, no deviation with line of progression  Stairs            Wheelchair Mobility     Tilt Bed    Modified Rankin (Stroke Patients Only)       Balance Overall balance assessment: Modified Independent (SLS >3sec with >3 attempts bilat.)                                           Pertinent Vitals/Pain Pain  Assessment Pain Assessment: No/denies pain    Home Living                          Prior Function                       Extremity/Trunk Assessment                Communication      Cognition                                                General Comments      Exercises     Assessment/Plan    PT Assessment Patient does not need any further PT services  PT Problem List         PT Treatment Interventions      PT Goals (Current goals can be found in the Care Plan section)  Acute Rehab PT Goals PT Goal Formulation: All assessment and education complete, DC therapy  Frequency       Co-evaluation               AM-PAC PT "6 Clicks" Mobility  Outcome Measure Help needed turning from your back to your side while in a flat bed without using bedrails?: None Help needed moving from lying on your back to sitting on the side of a flat bed without using bedrails?: None Help needed moving to and from a bed to a chair (including a wheelchair)?: None Help needed standing up from a chair using your arms (e.g., wheelchair or bedside chair)?: None Help needed to walk in hospital room?: None Help needed climbing 3-5 steps with a railing? : None 6 Click Score: 24    End of Session   Activity Tolerance: Patient tolerated treatment well;No increased pain Patient left: in bed;with family/visitor present        Time: 1610-9604 PT Time Calculation (min) (ACUTE ONLY): 9 min   Charges:   PT Evaluation $PT Eval Low Complexity: 1 Low   PT General Charges $$ ACUTE PT VISIT: 1 Visit        10:55 AM, 01/17/23 Dawn Eth, PT, DPT Physical Therapist - [facility]  [phone] (ASCOM)    Morgan Rennert C 01/17/2023, 10:53 AM

## 2023-01-17 NOTE — Progress Notes (Signed)
 OT Cancellation Note  Patient Details Name: Patrick Pena MRN: [id number] DOB: 23-Jun-1950   Cancelled Treatment:    Reason Eval/Treat Not Completed: OT screened, no needs identified, will sign off. Per evaluating PT, pt has no acute OT needs and OT to sign off in house.   [name] 01/17/2023, 10:48 AM

## 2023-01-17 NOTE — ED Notes (Signed)
 Pt provided cups of ice per request pt sitting upright in stretcher aox4 appears in nad speaking in full clear sentences

## 2023-01-17 NOTE — ED Notes (Signed)
 Pt appears to be resting quietly spouse @ bs pt denies needs is aox4 speaking in full clear sentences NIH 0 denies headache denies extremity weakness face symmetrical

## 2023-01-18 ENCOUNTER — Other Ambulatory Visit: Payer: Medicare PPO

## 2023-01-19 ENCOUNTER — Encounter: Payer: Self-pay | Admitting: Internal Medicine

## 2023-01-19 ENCOUNTER — Other Ambulatory Visit: Payer: Self-pay | Admitting: Internal Medicine

## 2023-01-19 DIAGNOSIS — G459 Transient cerebral ischemic attack, unspecified: Secondary | ICD-10-CM

## 2023-02-05 ENCOUNTER — Ambulatory Visit
Admission: RE | Admit: 2023-02-05 | Discharge: 2023-02-05 | Disposition: A | Discharge status: Home / Self Care | Payer: Medicare PPO | Source: Ambulatory Visit | Attending: Internal Medicine | Admitting: Internal Medicine

## 2023-02-05 DIAGNOSIS — G459 Transient cerebral ischemic attack, unspecified: Secondary | ICD-10-CM | POA: Diagnosis not present

## 2023-02-05 DIAGNOSIS — I119 Hypertensive heart disease without heart failure: Secondary | ICD-10-CM | POA: Insufficient documentation

## 2023-02-05 DIAGNOSIS — I340 Nonrheumatic mitral (valve) insufficiency: Secondary | ICD-10-CM | POA: Diagnosis not present

## 2023-02-05 LAB — ECHOCARDIOGRAM COMPLETE
AR max vel: 3.61 cm2
AV Area VTI: 3.92 cm2
AV Area mean vel: 3.49 cm2
AV Mean grad: 2 mm[Hg]
AV Peak grad: 3.6 mm[Hg]
Ao pk vel: 0.95 m/s
Area-P 1/2: 4.8 cm2
S' Lateral: 3.3 cm

## 2023-02-05 NOTE — Progress Notes (Signed)
*  PRELIMINARY RESULTS* Echocardiogram 2D Echocardiogram has been performed.  Cristela Blue 02/05/2023, 9:34 AM

## 2023-03-30 ENCOUNTER — Ambulatory Visit
Admission: RE | Admit: 2023-03-30 | Discharge: 2023-03-30 | Disposition: A | Discharge status: Home / Self Care | Payer: Medicare PPO | Source: Ambulatory Visit | Attending: Internal Medicine | Admitting: Internal Medicine

## 2023-03-30 DIAGNOSIS — C8213 Follicular lymphoma grade II, intra-abdominal lymph nodes: Secondary | ICD-10-CM | POA: Insufficient documentation

## 2023-03-30 DIAGNOSIS — R911 Solitary pulmonary nodule: Secondary | ICD-10-CM | POA: Diagnosis not present

## 2023-03-30 DIAGNOSIS — N433 Hydrocele, unspecified: Secondary | ICD-10-CM | POA: Insufficient documentation

## 2023-03-30 LAB — GLUCOSE, CAPILLARY: Glucose-Capillary: 82 mg/dL (ref 70–99)

## 2023-03-30 MED ORDER — FLUDEOXYGLUCOSE F - 18 (FDG) INJECTION
9.3000 | Freq: Once | INTRAVENOUS | Status: AC | PRN
  Administered 2023-03-30: 9.3 via INTRAVENOUS

## 2023-04-04 DIAGNOSIS — G459 Transient cerebral ischemic attack, unspecified: Secondary | ICD-10-CM | POA: Diagnosis not present

## 2023-04-04 DIAGNOSIS — C8293 Follicular lymphoma, unspecified, intra-abdominal lymph nodes: Secondary | ICD-10-CM | POA: Diagnosis not present

## 2023-04-20 ENCOUNTER — Inpatient Hospital Stay: Attending: Internal Medicine

## 2023-04-20 ENCOUNTER — Inpatient Hospital Stay (HOSPITAL_BASED_OUTPATIENT_CLINIC_OR_DEPARTMENT_OTHER): Admitting: Internal Medicine

## 2023-04-20 ENCOUNTER — Encounter: Payer: Self-pay | Admitting: Internal Medicine

## 2023-04-20 VITALS — BP 135/66 | HR 58 | Temp 97.9°F | Resp 18 | Ht 68.0 in | Wt 173.6 lb

## 2023-04-20 DIAGNOSIS — Z9221 Personal history of antineoplastic chemotherapy: Secondary | ICD-10-CM | POA: Insufficient documentation

## 2023-04-20 DIAGNOSIS — C8213 Follicular lymphoma grade II, intra-abdominal lymph nodes: Secondary | ICD-10-CM | POA: Diagnosis not present

## 2023-04-20 DIAGNOSIS — Z8546 Personal history of malignant neoplasm of prostate: Secondary | ICD-10-CM | POA: Diagnosis not present

## 2023-04-20 DIAGNOSIS — C8593 Non-Hodgkin lymphoma, unspecified, intra-abdominal lymph nodes: Secondary | ICD-10-CM | POA: Diagnosis not present

## 2023-04-20 LAB — CMP (CANCER CENTER ONLY)
ALT: 18 U/L (ref 0–44)
AST: 25 U/L (ref 15–41)
Albumin: 4 g/dL (ref 3.5–5.0)
Alkaline Phosphatase: 75 U/L (ref 38–126)
Anion gap: 9 (ref 5–15)
BUN: 17 mg/dL (ref 8–23)
CO2: 23 mmol/L (ref 22–32)
Calcium: 8.9 mg/dL (ref 8.9–10.3)
Chloride: 101 mmol/L (ref 98–111)
Creatinine: 1.1 mg/dL (ref 0.61–1.24)
GFR, Estimated: >60 mL/min (ref >60)
Glucose, Bld: 111 mg/dL — ABNORMAL HIGH (ref 70–99)
Potassium: 4.1 mmol/L (ref 3.5–5.1)
Sodium: 133 mmol/L — ABNORMAL LOW (ref 135–145)
Total Bilirubin: 0.7 mg/dL (ref 0.0–1.2)
Total Protein: 6.8 g/dL (ref 6.5–8.1)

## 2023-04-20 LAB — CBC WITH DIFFERENTIAL (CANCER CENTER ONLY)
Abs Immature Granulocytes: 0.01 10*3/uL (ref 0.00–0.07)
Basophils Absolute: 0.1 10*3/uL (ref 0.0–0.1)
Basophils Relative: 1 %
Eosinophils Absolute: 0.3 10*3/uL (ref 0.0–0.5)
Eosinophils Relative: 7 %
HCT: 51.7 % (ref 39.0–52.0)
Hemoglobin: 17.5 g/dL — ABNORMAL HIGH (ref 13.0–17.0)
Immature Granulocytes: 0 %
Lymphocytes Relative: 23 %
Lymphs Abs: 1 10*3/uL (ref 0.7–4.0)
MCH: 31.8 pg (ref 26.0–34.0)
MCHC: 33.8 g/dL (ref 30.0–36.0)
MCV: 94 fL (ref 80.0–100.0)
Monocytes Absolute: 0.8 10*3/uL (ref 0.1–1.0)
Monocytes Relative: 19 %
Neutro Abs: 2 10*3/uL (ref 1.7–7.7)
Neutrophils Relative %: 50 %
Platelet Count: 296 10*3/uL (ref 150–400)
RBC: 5.5 MIL/uL (ref 4.22–5.81)
RDW: 11.9 % (ref 11.5–15.5)
WBC Count: 4.1 10*3/uL (ref 4.0–10.5)
nRBC: 0 % (ref 0.0–0.2)

## 2023-04-20 LAB — LACTATE DEHYDROGENASE: LDH: 168 U/L (ref 98–192)

## 2023-04-20 NOTE — Assessment & Plan Note (Addendum)
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 Patient declined- Bendamustine rituximab ; currently on rituximab  weekly x 4. Hepatitis B/C- Negative. S/p  rituximab  weekly # 4 of planned 4 [last JAN 9th, 20025]-  APRIL 2025- PET scan: Partial response noted with  Interval decrease in size and hypermetabolic activity of RIGHT central peritoneal mass. Mass remains intensely hypermetabolic ( Deauville 5); Interval resolution of hypermetabolic splenic lesion; No new hypermetabolic adenopathy in the abdomen pelvis;  New small mildly metabolic RIGHT upper lobe pulmonary nodule is indeterminate.   # I reviewed with the patient the response on the PET scan in detail.  Recommend a follow-up PET scan in about 3 months.  Ordered today.  # January-2025 [TIA; on asprin Dr.Shah]- currently on asprin- stable.    # Vaccinations prophylactic: s/p  shingles; s/p pneumococcal vaccination.  # Prostate cancer-s/p RP   PSA normal-continue follow-up with urology [Dr.Borden; No fu].march 2024- PSA -WNL-will check PSA   # DISPOSITION:  # 3 months- MD; labs- cbc/cmp; LDH; PSA;PET prior- Dr.B.   # I reviewed the blood work- with the patient in detail; also reviewed the imaging independently [as summarized above]; and with the patient in detail.

## 2023-04-20 NOTE — Progress Notes (Signed)
 [facility] CONSULT NOTE  Patient Care Team: Trudy Dorn Lunger, MD as PCP - General (Internal Medicine) Edda Mt, MD as Consulting Physician (Otolaryngology) Rennie Cindy SAUNDERS, MD as Consulting Physician (Internal Medicine)  CHIEF COMPLAINTS/PURPOSE OF CONSULTATION: Lymphoma  #  Oncology History Overview Note  #Prostate cancer-s/p RP- Dr.Borden; July 2020- non-detectable.  #June 2021-CT scan-3.5 cm mesenteric mass [incidental]; # July 30th 2021- CT-Biopsy- # DIAGNOSIS: A. 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  Ki-67-10 to 20% [[facility] -opinion]; FLIPI- 2 [age/LDH-?213]  #PET negative thyroid nodule left side s/p Bx- benign nodule [Dr.Jeungle]  # NOV 2024 PET scan progressive disease.  DEC 2024- 8th-weekly rituximab  x 4.      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  Accompanied by his wife.  Patrick Pena 73 y.o.  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   Patient status post 3 cycles of rituximab  on a weekly basis.  No infusion reactions.  Pt has no complaints today.  Patient denies night sweats or fever. Good appetite. No swollen lymph node. Patient denies any nausea vomiting.  No fevers or chills.  Denies any abdominal pain.  No new lumps or bumps.  Review of  Systems  Constitutional:  Negative for chills, diaphoresis, fever, malaise/fatigue and weight loss.  HENT:  Negative for nosebleeds and sore throat.   Eyes:  Negative for double vision.  Respiratory:  Negative for cough, hemoptysis, sputum production, shortness of breath and wheezing.   Cardiovascular:  Negative for chest pain, palpitations, orthopnea and leg swelling.  Gastrointestinal:  Negative for abdominal pain, blood in stool, constipation, diarrhea, heartburn, melena, nausea and vomiting.  Genitourinary:  Negative for dysuria, frequency and urgency.  Musculoskeletal:  Positive for back pain and joint pain.  Skin: Negative.  Negative for itching and rash.  Neurological:  Negative for dizziness, tingling, focal weakness, weakness and headaches.  Endo/Heme/Allergies:  Does not bruise/bleed easily.  Psychiatric/Behavioral:  Negative for depression. The patient is not nervous/anxious and does not have insomnia.      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 03/13/2013   Fractured pelvis (HCC) 06/25/2019   from fall off of ladder   GERD (gastroesophageal reflux disease)    Hypercholesteremia    Hypercholesterolemia 03/13/2013   Hypertension    Prostate cancer [facility])    prostate cancer     SURGICAL HISTORY: Past Surgical History:  Procedure Laterality Date   LAPAROTOMY N/A 08/14/2019   Procedure: EXPLORATORY LAPAROTOMY EXCISION MESENTERIC MASS;  Surgeon: Dessa Reyes ORN, MD;  Location: [facility] ORS;  Service: General;  Laterality: N/A;   LYMPHADENECTOMY Bilateral 02/04/2015   Procedure: BILATERAL PELVIC LYMPHADENECTOMY;  Surgeon: Gretel Ferrara, MD;  Location:  [facility] ORS;  Service: Urology;  Laterality: Bilateral;   ROBOT ASSISTED LAPAROSCOPIC RADICAL PROSTATECTOMY N/A 02/04/2015   Procedure: XI  ROBOTIC ASSISTED LAPAROSCOPIC RADICAL PROSTATECTOMY LEVEL 2;  Surgeon: Gretel Ferrara, MD;  Location: [facility] ORS;  Service: Urology;  Laterality: N/A;    SOCIAL HISTORY: Social History   Socioeconomic History   Marital status: Married    Spouse name: Not on file   Number of children: Not on file   Years of education: Not on file   Highest education level: Not on file  Occupational History   Not on file  Tobacco Use   Smoking status: Never   Smokeless tobacco: Never  Vaping Use   Vaping status: Never Used  Substance and Sexual Activity   Alcohol use: Yes    Comment: 2-4 ounces almost every day    Drug use: No   Sexual activity: Not on file  Other Topics Concern   Not on file  Social History Narrative   Lives in [place]; curator; never smoked; whiskey every day.    Social Drivers of Corporate Investment Banker Strain: Low Risk  (04/04/2023)   Received from [facility]   Overall Financial Resource Strain (CARDIA)    Difficulty of Paying Living Expenses: Not hard at all  Food Insecurity: No Food Insecurity (04/04/2023)   Received from [facility]   Hunger Vital Sign    Worried About Running Out of Food in the Last Year: Never true    Ran Out of Food in the Last Year: Never true  Transportation Needs: No Transportation Needs (04/04/2023)   Received from [facility] - Transportation    In the past 12 months, has lack of transportation kept you from medical appointments or from getting medications?: No    Lack of Transportation (Non-Medical): No  Physical Activity: Not on file  Stress: Not on file  Social Connections: Not on file  Intimate Partner Violence: Not on file    FAMILY HISTORY: Family History  Problem Relation Age of Onset   Diabetes Mellitus II Other    Hyperlipidemia Other    Stroke Other    Hypertension Other    Kidney cancer Sister        survived   Hypertension Father    Prostate cancer Neg Hx     Tuberculosis Neg Hx     ALLERGIES:  is allergic to atorvastatin.  MEDICATIONS:  Current Outpatient Medications  Medication Sig Dispense Refill   aspirin  EC 81 MG tablet Take 1 tablet (81 mg total) by mouth daily. Swallow whole. 90 each 0   Calcium Carb-Cholecalciferol (CALCIUM 600 + D PO) Take 1 tablet by mouth 2 (two) times daily.     ivermectin (STROMECTOL) 3 MG TABS tablet Take 1 tablet by mouth daily.     Liniments (DEEP BLUE RELIEF EX) Apply 1 application topically 2 (two) times daily as needed (muscle pain.).     lisinopril  (PRINIVIL ,ZESTRIL ) 5 MG tablet TAKE 1 TABLET BY MOUTH DAILY 30 tablet 0   Multiple Vitamin (MULTIVITAMIN) tablet Take 1 tablet by mouth daily.     Naltrexone HCl, Pain, 4.5 MG CAPS Take 1 Dose by mouth daily.     No current facility-administered medications for this visit.       PHYSICAL EXAMINATION: ECOG PERFORMANCE STATUS: 0 - Asymptomatic  Vitals:   04/20/23 0854  BP: 135/66  Pulse: (!) 58  Resp: 18  Temp: 97.9 F (36.6 C)  SpO2: 98%    Filed Weights   04/20/23 0854  Weight: 173 lb 9.6 oz (78.7 kg)    Physical Exam Constitutional:      Comments: Accompanied by his wife.  He is walking independently.  HENT:     Head: Normocephalic and atraumatic.     Mouth/Throat:     Pharynx: No oropharyngeal exudate.  Eyes:     Pupils: Pupils are equal, round, and reactive to light.  Cardiovascular:     Rate and Rhythm: Normal rate and regular rhythm.  Pulmonary:     Effort: Pulmonary effort is normal. No respiratory distress.     Breath sounds: Normal breath sounds. No wheezing.  Abdominal:     General: Bowel sounds are normal. There is no distension.     Palpations: Abdomen is soft. There is no mass.     Tenderness: There is no abdominal tenderness. There is no guarding or rebound.  Musculoskeletal:        General: No tenderness. Normal range of motion.     Cervical back: Normal range of motion and neck supple.  Skin:    General: Skin is  warm.  Neurological:     Mental Status: He is alert and oriented to person, place, and time.  Psychiatric:        Mood and Affect: Affect normal.      LABORATORY DATA:  I have reviewed the data as listed Lab Results  Component Value Date   WBC 4.1 04/20/2023   HGB 17.5 (H) 04/20/2023   HCT 51.7 04/20/2023   MCV 94.0 04/20/2023   PLT 296 04/20/2023   Recent Labs    12/21/22 0820 01/16/23 1206 04/20/23 0845  NA 136 135 133*  K 4.3 3.6 4.1  CL 98 99 101  CO2 26 24 23   GLUCOSE 155* 116* 111*  BUN 17 19 17   CREATININE 1.02 1.00 1.10  CALCIUM 9.1 8.8* 8.9  GFRNONAA >60 >60 >60  PROT 6.7 6.5 6.8  ALBUMIN 3.8 3.9 4.0  AST 24 22 25   ALT 17 18 18   ALKPHOS 65 59 75  BILITOT 0.6 0.9 0.7    RADIOGRAPHIC STUDIES: I have personally reviewed the radiological images as listed and agreed with the findings in the report.  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 Patient declined- Bendamustine rituximab ; currently on rituximab  weekly x 4. Hepatitis B/C- Negative. S/p  rituximab  weekly # 4 of planned 4 [last JAN 9th, 20025]-  APRIL 2025- PET scan: Partial response noted with  Interval decrease in size and hypermetabolic activity of RIGHT central peritoneal mass. Mass remains intensely hypermetabolic ( Deauville 5); Interval resolution of hypermetabolic splenic lesion; No new hypermetabolic adenopathy in the abdomen pelvis;  New small mildly metabolic RIGHT upper lobe pulmonary nodule is indeterminate.   # I reviewed with the patient the response on the PET scan in  detail.  Recommend a follow-up PET scan in about 3 months.  Ordered today.  # January-2025 [TIA; on asprin Dr.Shah]- currently on asprin- stable.    # Vaccinations prophylactic: s/p  shingles; s/p pneumococcal vaccination.  # Prostate cancer-s/p RP   PSA normal-continue follow-up  with urology [Dr.Borden; No fu].march 2024- PSA -WNL-will check PSA   # DISPOSITION:  # 3 months- MD; labs- cbc/cmp; LDH; PSA;PET prior- Dr.B.   # I reviewed the blood work- with the patient in detail; also reviewed the imaging independently [as summarized above]; and with the patient in detail.     All questions were answered. The patient knows to call the clinic with any problems, questions or concerns.    Cindy JONELLE Joe, MD 04/20/2023 9:47 AM

## 2023-04-20 NOTE — Progress Notes (Signed)
 Pt was seen at [facility] for  Acute nonintractable headache, unspecified headache type  Left sided numbness 01/17/23.    PET 04/13/23. MRI brain, CT angio, CT head.

## 2023-04-21 ENCOUNTER — Other Ambulatory Visit: Payer: Self-pay

## 2023-04-27 ENCOUNTER — Other Ambulatory Visit: Payer: Self-pay

## 2023-05-11 ENCOUNTER — Ambulatory Visit: Payer: Medicare PPO | Admitting: Internal Medicine

## 2023-05-11 ENCOUNTER — Other Ambulatory Visit: Payer: Medicare PPO

## 2023-07-17 ENCOUNTER — Ambulatory Visit
Admission: RE | Admit: 2023-07-17 | Discharge: 2023-07-17 | Disposition: A | Discharge status: Home / Self Care | Source: Ambulatory Visit | Attending: Internal Medicine | Admitting: Internal Medicine

## 2023-07-17 DIAGNOSIS — R911 Solitary pulmonary nodule: Secondary | ICD-10-CM | POA: Insufficient documentation

## 2023-07-17 DIAGNOSIS — E119 Type 2 diabetes mellitus without complications: Secondary | ICD-10-CM | POA: Insufficient documentation

## 2023-07-17 DIAGNOSIS — K5730 Diverticulosis of large intestine without perforation or abscess without bleeding: Secondary | ICD-10-CM | POA: Diagnosis not present

## 2023-07-17 DIAGNOSIS — C8213 Follicular lymphoma grade II, intra-abdominal lymph nodes: Secondary | ICD-10-CM | POA: Diagnosis present

## 2023-07-17 LAB — GLUCOSE, CAPILLARY: Glucose-Capillary: 94 mg/dL (ref 70–99)

## 2023-07-17 MED ORDER — FLUDEOXYGLUCOSE F - 18 (FDG) INJECTION
9.0000 | Freq: Once | INTRAVENOUS | Status: AC | PRN
  Administered 2023-07-17: 9.22 via INTRAVENOUS

## 2023-07-27 ENCOUNTER — Other Ambulatory Visit

## 2023-07-27 ENCOUNTER — Ambulatory Visit: Admitting: Internal Medicine

## 2023-08-02 ENCOUNTER — Encounter: Payer: Self-pay | Admitting: Internal Medicine

## 2023-08-02 ENCOUNTER — Inpatient Hospital Stay: Attending: Internal Medicine

## 2023-08-02 ENCOUNTER — Inpatient Hospital Stay (HOSPITAL_BASED_OUTPATIENT_CLINIC_OR_DEPARTMENT_OTHER): Admitting: Internal Medicine

## 2023-08-02 VITALS — BP 144/74 | HR 57 | Temp 98.4°F | Resp 19 | Wt 172.1 lb

## 2023-08-02 DIAGNOSIS — C8213 Follicular lymphoma grade II, intra-abdominal lymph nodes: Secondary | ICD-10-CM

## 2023-08-02 DIAGNOSIS — Z8673 Personal history of transient ischemic attack (TIA), and cerebral infarction without residual deficits: Secondary | ICD-10-CM | POA: Diagnosis not present

## 2023-08-02 DIAGNOSIS — C8200 Follicular lymphoma grade I, unspecified site: Secondary | ICD-10-CM | POA: Insufficient documentation

## 2023-08-02 DIAGNOSIS — C61 Malignant neoplasm of prostate: Secondary | ICD-10-CM | POA: Insufficient documentation

## 2023-08-02 DIAGNOSIS — Z7901 Long term (current) use of anticoagulants: Secondary | ICD-10-CM | POA: Insufficient documentation

## 2023-08-02 LAB — CBC WITH DIFFERENTIAL (CANCER CENTER ONLY)
Abs Immature Granulocytes: 0.02 K/uL (ref 0.00–0.07)
Basophils Absolute: 0.1 K/uL (ref 0.0–0.1)
Basophils Relative: 1 %
Eosinophils Absolute: 0.4 K/uL (ref 0.0–0.5)
Eosinophils Relative: 8 %
HCT: 52.8 % — ABNORMAL HIGH (ref 39.0–52.0)
Hemoglobin: 17.7 g/dL — ABNORMAL HIGH (ref 13.0–17.0)
Immature Granulocytes: 0 %
Lymphocytes Relative: 25 %
Lymphs Abs: 1.4 K/uL (ref 0.7–4.0)
MCH: 31.4 pg (ref 26.0–34.0)
MCHC: 33.5 g/dL (ref 30.0–36.0)
MCV: 93.6 fL (ref 80.0–100.0)
Monocytes Absolute: 0.7 K/uL (ref 0.1–1.0)
Monocytes Relative: 13 %
Neutro Abs: 2.8 K/uL (ref 1.7–7.7)
Neutrophils Relative %: 53 %
Platelet Count: 270 K/uL (ref 150–400)
RBC: 5.64 MIL/uL (ref 4.22–5.81)
RDW: 13.1 % (ref 11.5–15.5)
WBC Count: 5.5 K/uL (ref 4.0–10.5)
nRBC: 0 % (ref 0.0–0.2)

## 2023-08-02 LAB — CMP (CANCER CENTER ONLY)
ALT: 18 U/L (ref 0–44)
AST: 23 U/L (ref 15–41)
Albumin: 4 g/dL (ref 3.5–5.0)
Alkaline Phosphatase: 68 U/L (ref 38–126)
Anion gap: 8 (ref 5–15)
BUN: 21 mg/dL (ref 8–23)
CO2: 25 mmol/L (ref 22–32)
Calcium: 9 mg/dL (ref 8.9–10.3)
Chloride: 102 mmol/L (ref 98–111)
Creatinine: 0.99 mg/dL (ref 0.61–1.24)
GFR, Estimated: >60 mL/min (ref >60)
Glucose, Bld: 108 mg/dL — ABNORMAL HIGH (ref 70–99)
Potassium: 3.9 mmol/L (ref 3.5–5.1)
Sodium: 135 mmol/L (ref 135–145)
Total Bilirubin: 0.9 mg/dL (ref 0.0–1.2)
Total Protein: 6.7 g/dL (ref 6.5–8.1)

## 2023-08-02 LAB — PSA: Prostatic Specific Antigen: <0.01 ng/mL (ref 0.00–4.00)

## 2023-08-02 NOTE — Assessment & Plan Note (Addendum)
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 Patient declined- Bendamustine rituximab ; currently on rituximab  weekly x 4. Hepatitis B/C- Negative. S/p  rituximab  weekly # 4 of planned 4 [last JAN 9th, 20025]   # JULY 18th, 2025- PET scan:  Right-sided central mesenteric mass again identified in a similar uptake to previous but is slightly larger today. Deauville category 5. No other areas of abnormal uptake identified. No splenic involvement. Small right-sided lung nodule appears smaller and less dense today.   # I reviewed with the patient by that July 2025 PET scan-shows overall improvement compared to pretreatment PET scan in November 2024.  Given patient's conservative preference I think is reasonable to follow-up with a PET scan again in Jan 2026.  Ordered today.  # January-2025 [TIA; on asprin Dr.Shah]- currently on asprin- stable.    # Vaccinations prophylactic: s/p  shingles; s/p pneumococcal vaccination.  # Prostate cancer-s/p RP   PSA normal-continue follow-up with urology [Dr.Borden; No fu].march 2024- PSA -WNL-will check PSA   # DISPOSITION:  # 6  months- MD; labs- cbc/cmp; LDH; PSA;PET prior- Dr.B. \  # I reviewed the blood work- with the patient in detail; also reviewed the imaging independently [as summarized above]; and with the patient in detail.

## 2023-08-02 NOTE — Addendum Note (Signed)
 Addended by: LAEL BROWNING A on: 08/02/2023 11:39 AM   Modules accepted: Orders

## 2023-08-02 NOTE — Progress Notes (Signed)
 [facility] CONSULT NOTE  Patient Care Team: Trudy Dorn Lunger, MD as PCP - General (Internal Medicine) Edda Mt, MD as Consulting Physician (Otolaryngology) Rennie Cindy SAUNDERS, MD as Consulting Physician (Oncology)  CHIEF COMPLAINTS/PURPOSE OF CONSULTATION: Lymphoma  #  Oncology History Overview Note  #Prostate cancer-s/p RP- Dr.Borden; July 2020- non-detectable.  #June 2021-CT scan-3.5 cm mesenteric mass [incidental]; # July 30th 2021- CT-Biopsy- # DIAGNOSIS: A. 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  Ki-67-10 to 20% [[facility] -opinion]; FLIPI- 2 [age/LDH-?213]  #PET negative thyroid nodule left side s/p Bx- benign nodule [Dr.Jeungle]  # NOV 2024 PET scan progressive disease.  DEC 2024- 8th-weekly rituximab  x 4.      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  Accompanied by his wife.  Patrick Pena 73 y.o.  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  No further episodes of TIA.  No falls.  Pt has no complaints today.  Patient denies night sweats or fever. Good appetite. No swollen lymph node. Patient denies any nausea vomiting.  No fevers or chills.  Denies any abdominal pain.  No new lumps or bumps.  Review  of Systems  Constitutional:  Negative for chills, diaphoresis, fever, malaise/fatigue and weight loss.  HENT:  Negative for nosebleeds and sore throat.   Eyes:  Negative for double vision.  Respiratory:  Negative for cough, hemoptysis, sputum production, shortness of breath and wheezing.   Cardiovascular:  Negative for chest pain, palpitations, orthopnea and leg swelling.  Gastrointestinal:  Negative for abdominal pain, blood in stool, constipation, diarrhea, heartburn, melena, nausea and vomiting.  Genitourinary:  Negative for dysuria, frequency and urgency.  Musculoskeletal:  Positive for back pain and joint pain.  Skin: Negative.  Negative for itching and rash.  Neurological:  Negative for dizziness, tingling, focal weakness, weakness and headaches.  Endo/Heme/Allergies:  Does not bruise/bleed easily.  Psychiatric/Behavioral:  Negative for depression. The patient is not nervous/anxious and does not have insomnia.      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 03/13/2013   Fractured pelvis (HCC) 06/25/2019   from fall off of ladder   GERD (gastroesophageal reflux disease)    Hypercholesteremia    Hypercholesterolemia 03/13/2013   Hypertension    Prostate cancer [facility])    prostate cancer     SURGICAL HISTORY: Past Surgical History:  Procedure Laterality Date   LAPAROTOMY N/A 08/14/2019   Procedure: EXPLORATORY LAPAROTOMY EXCISION MESENTERIC MASS;  Surgeon: Dessa Reyes ORN, MD;  Location: [facility] ORS;  Service: General;  Laterality: N/A;   LYMPHADENECTOMY Bilateral 02/04/2015   Procedure: BILATERAL PELVIC LYMPHADENECTOMY;  Surgeon: Gretel Ferrara,  MD;  Location: [facility]S;  Service: Urology;  Laterality: Bilateral;   ROBOT ASSISTED LAPAROSCOPIC RADICAL PROSTATECTOMY N/A 02/04/2015   Procedure:  XI ROBOTIC ASSISTED LAPAROSCOPIC RADICAL PROSTATECTOMY LEVEL 2;  Surgeon: Gretel Ferrara, MD;  Location: [facility]S;  Service: Urology;  Laterality: N/A;    SOCIAL HISTORY: Social History   Socioeconomic History   Marital status: Married    Spouse name: Not on file   Number of children: Not on file   Years of education: Not on file   Highest education level: Not on file  Occupational History   Not on file  Tobacco Use   Smoking status: Never   Smokeless tobacco: Never  Vaping Use   Vaping status: Never Used  Substance and Sexual Activity   Alcohol use: Yes    Comment: 2-4 ounces almost every day    Drug use: No   Sexual activity: Not on file  Other Topics Concern   Not on file  Social History Narrative   Lives in [place]; Curator; never smoked; whiskey every day.    Social Drivers of Corporate investment banker Strain: Low Risk  (04/04/2023)   Received from [facility]   Overall Financial Resource Strain (CARDIA)    Difficulty of Paying Living Expenses: Not hard at all  Food Insecurity: No Food Insecurity (04/04/2023)   Received from [facility]   Hunger Vital Sign    Within the past 12 months, you worried that your food would run out before you got the money to buy more.: Never true    Within the past 12 months, the food you bought just didn't last and you didn't have money to get more.: Never true  Transportation Needs: No Transportation Needs (04/04/2023)   Received from [facility] - Transportation    In the past 12 months, has lack of transportation kept you from medical appointments or from getting medications?: No    Lack of Transportation (Non-Medical): No  Physical Activity: Not on file  Stress: Not on file  Social Connections: Not on file  Intimate Partner Violence: Not on file    FAMILY HISTORY: Family History  Problem Relation Age of Onset   Diabetes Mellitus II Other    Hyperlipidemia Other     Stroke Other    Hypertension Other    Kidney cancer Sister        survived   Hypertension Father    Prostate cancer Neg Hx    Tuberculosis Neg Hx     ALLERGIES:  is allergic to atorvastatin.  MEDICATIONS:  Current Outpatient Medications  Medication Sig Dispense Refill   aspirin  EC 81 MG tablet Take 1 tablet (81 mg total) by mouth daily. Swallow whole. 90 each 0   Calcium Carb-Cholecalciferol (CALCIUM 600 + D PO) Take 1 tablet by mouth 2 (two) times daily.     ivermectin (STROMECTOL) 3 MG TABS tablet Take 1 tablet by mouth daily.     Liniments (DEEP BLUE RELIEF EX) Apply 1 application topically 2 (two) times daily as needed (muscle pain.).     lisinopril  (PRINIVIL ,ZESTRIL ) 5 MG tablet TAKE 1 TABLET BY MOUTH DAILY 30 tablet 0   Multiple Vitamin (MULTIVITAMIN) tablet Take 1 tablet by mouth daily.     Naltrexone HCl, Pain, 4.5 MG CAPS Take 1 Dose by mouth daily.     No current facility-administered medications for this visit.       PHYSICAL EXAMINATION: ECOG PERFORMANCE STATUS:  0 - Asymptomatic  Vitals:   08/02/23 1019  BP: (!) 144/74  Pulse: (!) 57  Resp: 19  Temp: 98.4 F (36.9 C)  SpO2: 98%    Filed Weights   08/02/23 1019  Weight: 172 lb 1.6 oz (78.1 kg)    Physical Exam Constitutional:      Comments: Accompanied by his wife.  He is walking independently.  HENT:     Head: Normocephalic and atraumatic.     Mouth/Throat:     Pharynx: No oropharyngeal exudate.  Eyes:     Pupils: Pupils are equal, round, and reactive to light.  Cardiovascular:     Rate and Rhythm: Normal rate and regular rhythm.  Pulmonary:     Effort: Pulmonary effort is normal. No respiratory distress.     Breath sounds: Normal breath sounds. No wheezing.  Abdominal:     General: Bowel sounds are normal. There is no distension.     Palpations: Abdomen is soft. There is no mass.     Tenderness: There is no abdominal tenderness. There is no guarding or rebound.  Musculoskeletal:         General: No tenderness. Normal range of motion.     Cervical back: Normal range of motion and neck supple.  Skin:    General: Skin is warm.  Neurological:     Mental Status: He is alert and oriented to person, place, and time.  Psychiatric:        Mood and Affect: Affect normal.      LABORATORY DATA:  I have reviewed the data as listed Lab Results  Component Value Date   WBC 5.5 08/02/2023   HGB 17.7 (H) 08/02/2023   HCT 52.8 (H) 08/02/2023   MCV 93.6 08/02/2023   PLT 270 08/02/2023   Recent Labs    01/16/23 1206 04/20/23 0845 08/02/23 1000  NA 135 133* 135  K 3.6 4.1 3.9  CL 99 101 102  CO2 24 23 25   GLUCOSE 116* 111* 108*  BUN 19 17 21   CREATININE 1.00 1.10 0.99  CALCIUM 8.8* 8.9 9.0  GFRNONAA >60 >60 >60  PROT 6.5 6.8 6.7  ALBUMIN 3.9 4.0 4.0  AST 22 25 23   ALT 18 18 18   ALKPHOS 59 75 68  BILITOT 0.9 0.7 0.9    RADIOGRAPHIC STUDIES: I have personally reviewed the radiological images as listed and agreed with the findings in the report.  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 Patient declined- Bendamustine rituximab ; currently on rituximab  weekly x 4. Hepatitis B/C- Negative. S/p  rituximab  weekly # 4 of planned 4 [last JAN 9th, 20025]   # JULY 18th, 2025- PET scan:  Right-sided central mesenteric mass again identified in a similar uptake to previous but is slightly larger today. Deauville category 5. No other areas of abnormal uptake identified. No splenic involvement. Small right-sided lung nodule appears smaller and less dense today.   # I  reviewed with the patient by that July 2025 PET scan-shows overall improvement compared to pretreatment PET scan in November 2024.  Given patient's conservative preference I think is reasonable to follow-up with a PET scan again  in Jan 2026.  Ordered today.  # January-2025 [TIA; on asprin Dr.Shah]- currently on asprin- stable.    # Vaccinations prophylactic: s/p  shingles; s/p pneumococcal vaccination.  # Prostate cancer-s/p RP   PSA normal-continue follow-up with urology [Dr.Borden; No fu].march 2024- PSA -WNL-will check PSA   # DISPOSITION:  # 6  months- MD; labs- cbc/cmp; LDH; PSA;PET prior- Dr.B. \  # I reviewed the blood work- with the patient in detail; also reviewed the imaging independently [as summarized above]; and with the patient in detail.  All questions were answered. The patient knows to call the clinic with any problems, questions or concerns.    Cindy JONELLE Joe, MD 08/02/2023 11:22 AM

## 2023-08-02 NOTE — Progress Notes (Signed)
 Patient has no concerns

## 2023-08-03 ENCOUNTER — Other Ambulatory Visit: Payer: Self-pay

## 2024-01-31 ENCOUNTER — Encounter: Payer: Self-pay | Admitting: Internal Medicine

## 2024-02-04 ENCOUNTER — Ambulatory Visit: Admission: RE | Admit: 2024-02-04 | Source: Ambulatory Visit

## 2024-02-04 DIAGNOSIS — C8213 Follicular lymphoma grade II, intra-abdominal lymph nodes: Secondary | ICD-10-CM

## 2024-02-04 LAB — GLUCOSE, CAPILLARY: Glucose-Capillary: 93 mg/dL (ref 70–99)

## 2024-02-04 MED ORDER — FLUDEOXYGLUCOSE F - 18 (FDG) INJECTION
8.9000 | Freq: Once | INTRAVENOUS | Status: AC | PRN
  Administered 2024-02-04: 9.08 via INTRAVENOUS

## 2024-02-08 ENCOUNTER — Encounter: Payer: Self-pay | Admitting: Internal Medicine

## 2024-02-08 ENCOUNTER — Inpatient Hospital Stay: Admitting: Internal Medicine

## 2024-02-08 ENCOUNTER — Inpatient Hospital Stay

## 2024-02-08 VITALS — BP 144/62 | HR 60 | Temp 98.3°F | Resp 12 | Ht 68.0 in | Wt 173.5 lb

## 2024-02-08 DIAGNOSIS — C8213 Follicular lymphoma grade II, intra-abdominal lymph nodes: Secondary | ICD-10-CM

## 2024-02-08 DIAGNOSIS — D582 Other hemoglobinopathies: Secondary | ICD-10-CM

## 2024-02-08 LAB — CBC WITH DIFFERENTIAL (CANCER CENTER ONLY)
Abs Immature Granulocytes: 0.03 10*3/uL (ref 0.00–0.07)
Basophils Absolute: 0 10*3/uL (ref 0.0–0.1)
Basophils Relative: 1 %
Eosinophils Absolute: 0.3 10*3/uL (ref 0.0–0.5)
Eosinophils Relative: 4 %
HCT: 50.5 % (ref 39.0–52.0)
Hemoglobin: 17.2 g/dL — ABNORMAL HIGH (ref 13.0–17.0)
Immature Granulocytes: 1 %
Lymphocytes Relative: 23 %
Lymphs Abs: 1.5 10*3/uL (ref 0.7–4.0)
MCH: 32.1 pg (ref 26.0–34.0)
MCHC: 34.1 g/dL (ref 30.0–36.0)
MCV: 94.2 fL (ref 80.0–100.0)
Monocytes Absolute: 0.6 10*3/uL (ref 0.1–1.0)
Monocytes Relative: 9 %
Neutro Abs: 4.2 10*3/uL (ref 1.7–7.7)
Neutrophils Relative %: 62 %
Platelet Count: 307 10*3/uL (ref 150–400)
RBC: 5.36 MIL/uL (ref 4.22–5.81)
RDW: 12.4 % (ref 11.5–15.5)
WBC Count: 6.7 10*3/uL (ref 4.0–10.5)
nRBC: 0 % (ref 0.0–0.2)

## 2024-02-08 LAB — CMP (CANCER CENTER ONLY)
ALT: 16 U/L (ref 0–44)
AST: 24 U/L (ref 15–41)
Albumin: 4.2 g/dL (ref 3.5–5.0)
Alkaline Phosphatase: 80 U/L (ref 38–126)
Anion gap: 13 (ref 5–15)
BUN: 17 mg/dL (ref 8–23)
CO2: 25 mmol/L (ref 22–32)
Calcium: 9.1 mg/dL (ref 8.9–10.3)
Chloride: 99 mmol/L (ref 98–111)
Creatinine: 0.95 mg/dL (ref 0.61–1.24)
GFR, Estimated: >60 mL/min
Glucose, Bld: 163 mg/dL — ABNORMAL HIGH (ref 70–99)
Potassium: 4.3 mmol/L (ref 3.5–5.1)
Sodium: 137 mmol/L (ref 135–145)
Total Bilirubin: 0.7 mg/dL (ref 0.0–1.2)
Total Protein: 6.4 g/dL — ABNORMAL LOW (ref 6.5–8.1)

## 2024-02-08 LAB — LACTATE DEHYDROGENASE: LDH: 172 U/L (ref 105–235)

## 2024-02-08 LAB — PSA: Prostatic Specific Antigen: <0.02 ng/mL (ref 0.00–4.00)

## 2024-02-08 NOTE — Progress Notes (Signed)
 PET 02/04/24.  Has been taking doxy for a cold.

## 2024-02-08 NOTE — Progress Notes (Signed)
 [facility] CONSULT NOTE  Patient Care Team: Trudy Dorn Lunger, MD (Inactive) as PCP - General (Internal Medicine) Edda Mt, MD as Consulting Physician (Otolaryngology) Rennie Cindy SAUNDERS, MD as Consulting Physician (Oncology)  CHIEF COMPLAINTS/PURPOSE OF CONSULTATION: Lymphoma  #  Oncology History Overview Note  #Prostate cancer-s/p RP- Dr.Borden; July 2020- non-detectable.  #June 2021-CT scan-3.5 cm mesenteric mass [incidental]; # July 30th 2021- CT-Biopsy- # DIAGNOSIS: A. 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  Ki-67-10 to 20% [[facility] -opinion]; FLIPI- 2 [age/LDH-?213]  #PET negative thyroid nodule left side s/p Bx- benign nodule [Dr.Jeungle]  # NOV 2024 PET scan progressive disease.  DEC 2024- 8th-weekly rituximab  x 4.      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  Accompanied by his wife.  Patrick Pena 74 y.o.  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  Discussed the use of AI scribe software for clinical note transcription with the patient, who gave verbal consent to proceed.  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 He reports no abdominal pain, nausea, vomiting, or gastrointestinal complaints.  He also has a history of prostate cancer and continues to undergo PSA monitoring. Previous PSA values have been within normal limits, and he is not aware of any current issues related to prostate cancer.  His hemoglobin has been mildly elevated, most recently measured at 17 g/dL, and has remained stable over several years. He has not experienced symptoms such as fatigue, dizziness, or other concerning signs related to this finding.  He is currently experiencing mild upper respiratory symptoms, including cough and rhinorrhea, which have improved significantly with doxycycline. He denies fever, chills, night sweats, transient ischemic attacks, falls, injuries, or new neurological or constitutional symptoms.      Review of Systems  Constitutional:  Negative for chills, diaphoresis, fever, malaise/fatigue and weight loss.  HENT:  Negative for nosebleeds and sore throat.   Eyes:  Negative for double vision.  Respiratory:  Negative for cough, hemoptysis, sputum production, shortness of breath and wheezing.   Cardiovascular:  Negative for chest pain, palpitations, orthopnea and leg swelling.  Gastrointestinal:  Negative for abdominal pain, blood in stool, constipation, diarrhea, heartburn, melena, nausea and vomiting.  Genitourinary:  Negative for dysuria, frequency and urgency.  Musculoskeletal:  Positive for back pain and joint pain.  Skin: Negative.  Negative for itching and rash.  Neurological:  Negative for dizziness, tingling, focal weakness, weakness and headaches.  Endo/Heme/Allergies:  Does not bruise/bleed easily.  Psychiatric/Behavioral:  Negative for depression. The patient is not nervous/anxious and does not have  insomnia.  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 03/13/2013   Fractured pelvis (HCC) 06/25/2019   from fall off of ladder   GERD (gastroesophageal reflux disease)    Hypercholesteremia    Hypercholesterolemia 03/13/2013   Hypertension    Prostate cancer [facility])    prostate cancer     SURGICAL HISTORY: Past Surgical History:  Procedure Laterality Date   LAPAROTOMY N/A 08/14/2019   Procedure: EXPLORATORY LAPAROTOMY EXCISION MESENTERIC MASS;  Surgeon: Dessa Reyes ORN, MD;  Location: [facility] ORS;  Service: General;  Laterality: N/A;   LYMPHADENECTOMY Bilateral 02/04/2015   Procedure: BILATERAL PELVIC LYMPHADENECTOMY;  Surgeon: Gretel Ferrara, MD;  Location: [facility] ORS;  Service: Urology;  Laterality: Bilateral;   ROBOT ASSISTED LAPAROSCOPIC RADICAL PROSTATECTOMY N/A 02/04/2015   Procedure: XI ROBOTIC ASSISTED LAPAROSCOPIC RADICAL PROSTATECTOMY LEVEL 2;  Surgeon: Gretel Ferrara, MD;  Location: [facility] ORS;  Service: Urology;  Laterality: N/A;    SOCIAL HISTORY: Social History   Socioeconomic History   Marital status: Married    Spouse name: Not on file   Number of children: Not on file   Years of education: Not on file   Highest education level: Not on file  Occupational History   Not on file  Tobacco Use   Smoking status: Never   Smokeless tobacco: Never  Vaping Use   Vaping status: Never Used  Substance and Sexual Activity   Alcohol use: Yes    Comment: 2-4 ounces almost every day    Drug use: No   Sexual activity: Not on file  Other Topics Concern   Not on file  Social History Narrative   Lives in [place]; curator; never smoked; whiskey every day.    Social Drivers of Health   Tobacco Use: Low Risk (02/08/2024)   Patient History    Smoking Tobacco Use: Never    Smokeless  Tobacco Use: Never    Passive Exposure: Not on file  Financial Resource Strain: Low Risk  (04/04/2023)   Received from [facility]   Overall Financial Resource Strain (CARDIA)    Difficulty of Paying Living Expenses: Not hard at all  Food Insecurity: No Food Insecurity (04/04/2023)   Received from [facility]   Epic    Within the past 12 months, you worried that your food would run out before you got the money to buy more.: Never true    Within the past 12 months, the food you bought just didn't last and you didn't have money to get more.: Never true  Transportation Needs: No Transportation Needs (04/04/2023)   Received from [facility] - Transportation    In the past 12 months, has lack of transportation kept you from medical appointments or from getting medications?: No    Lack of Transportation (Non-Medical): No  Physical Activity: Not on file  Stress: Not on file  Social Connections: Not on file  Intimate Partner Violence: Not on file  Depression (EYV7-0): Low Risk (02/08/2024)   Depression (PHQ2-9)    PHQ-2 Score: 0  Alcohol Screen: Not on file  Housing: Low Risk  (04/04/2023)   Received from [facility]   Epic    In the last 12 months, was there a time when you were not able to pay  the mortgage or rent on time?: No    In the past 12 months, how many times have you moved where you were living?: 0    At any time in the past 12 months, were you homeless or living in a shelter (including now)?: No  Utilities: Not At Risk (04/04/2023)   Received from [facility] Utilities    Threatened with loss of utilities: No  Health Literacy: Not on file    FAMILY HISTORY: Family History  Problem Relation Age of Onset   Diabetes Mellitus II Other    Hyperlipidemia Other    Stroke Other    Hypertension Other    Kidney cancer Sister        survived   Hypertension Father    Prostate cancer  Neg Hx    Tuberculosis Neg Hx     ALLERGIES:  is allergic to atorvastatin.  MEDICATIONS:  Current Outpatient Medications  Medication Sig Dispense Refill   aspirin  EC 81 MG tablet Take 1 tablet (81 mg total) by mouth daily. Swallow whole. 90 each 0   doxycycline (VIBRA-TABS) 100 MG tablet Take 100 mg by mouth 2 (two) times daily.     ivermectin (STROMECTOL) 3 MG TABS tablet Take 1 tablet by mouth daily.     Liniments (DEEP BLUE RELIEF EX) Apply 1 application topically 2 (two) times daily as needed (muscle pain.).     lisinopril  (PRINIVIL ,ZESTRIL ) 5 MG tablet TAKE 1 TABLET BY MOUTH DAILY 30 tablet 0   Multiple Vitamin (MULTIVITAMIN) tablet Take 1 tablet by mouth daily.     Naltrexone HCl, Pain, 4.5 MG CAPS Take 1 Dose by mouth daily.     No current facility-administered medications for this visit.       PHYSICAL EXAMINATION: ECOG PERFORMANCE STATUS: 0 - Asymptomatic  Vitals:   02/08/24 1016 02/08/24 1038  BP: (!) 141/66 (!) 144/62  Pulse: 60   Resp: 12   Temp: 98.3 F (36.8 C)   SpO2: 97%     Filed Weights   02/08/24 1016  Weight: 173 lb 8 oz (78.7 kg)    Physical Exam Constitutional:      Comments: Accompanied by his wife.  He is walking independently.  HENT:     Head: Normocephalic and atraumatic.     Mouth/Throat:     Pharynx: No oropharyngeal exudate.  Eyes:     Pupils: Pupils are equal, round, and reactive to light.  Cardiovascular:     Rate and Rhythm: Normal rate and regular rhythm.  Pulmonary:     Effort: Pulmonary effort is normal. No respiratory distress.     Breath sounds: Normal breath sounds. No wheezing.  Abdominal:     General: Bowel sounds are normal. There is no distension.     Palpations: Abdomen is soft. There is no mass.     Tenderness: There is no abdominal tenderness. There is no guarding or rebound.  Musculoskeletal:        General: No tenderness. Normal range of motion.     Cervical back: Normal range of motion and neck supple.   Skin:    General: Skin is warm.  Neurological:     Mental Status: He is alert and oriented to person, place, and time.  Psychiatric:        Mood and Affect: Affect normal.      LABORATORY DATA:  I have reviewed the data as listed Lab Results  Component Value Date   WBC 6.7  02/08/2024   HGB 17.2 (H) 02/08/2024   HCT 50.5 02/08/2024   MCV 94.2 02/08/2024   PLT 307 02/08/2024   Recent Labs    04/20/23 0845 08/02/23 1000 02/08/24 1011  NA 133* 135 137  K 4.1 3.9 4.3  CL 101 102 99  CO2 23 25 25   GLUCOSE 111* 108* 163*  BUN 17 21 17   CREATININE 1.10 0.99 0.95  CALCIUM 8.9 9.0 9.1  GFRNONAA >60 >60 >60  PROT 6.8 6.7 6.4*  ALBUMIN 4.0 4.0 4.2  AST 25 23 24   ALT 18 18 16   ALKPHOS 75 68 80  BILITOT 0.7 0.9 0.7    RADIOGRAPHIC STUDIES: I have personally reviewed the radiological images as listed and agreed with the findings in the report.  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 Patient declined- Bendamustine rituximab ; currently on rituximab  weekly x 4. Hepatitis B/C- Negative. S/p  rituximab  weekly # 4 of planned 4 [last JAN 9th, 20025].  PET scan Feb, 2nd 2026- . Stable intensely hypermetabolic central mesenteric mass measuring 4.3 x 4.4 cm, consistent with known follicular lymphoma;  No new sites of FDG-avid disease in the abdomen or pelvis, and no evidence of osseous metastases  # Given the lack of any significant worsening of the disease or new sites of disease/and overall patient/family preference for conservative management I think is reasonable  to continue surveillance.  Will repeat a PET scan again in 6 months  # Elevated hemoglobin 17-no obvious reason noted.  Recommend checking JAK2 and EPO level next visit  # January-2025 [TIA; on asprin Dr.Shah]- currently on asprin- stable.    # Vaccinations prophylactic: s/p  shingles; s/p pneumococcal vaccination.  # Prostate cancer-s/p RP   PSA normal-continue follow-up with urology [Dr.Borden; No fu].march 2024- PSA -WNL-will check PSA   # DISPOSITION:  # 6  months- MD; labs- cbc/cmp; LDH; PSA; PET prior- Dr.B.  # I reviewed the blood work- with the patient in detail; also reviewed the imaging independently [as summarized above]; and with the patient in detail.   All questions were answered. The patient knows to call the clinic with any problems, questions or concerns.    Cindy JONELLE Joe, MD 02/08/2024 11:23 AM

## 2024-02-08 NOTE — Assessment & Plan Note (Addendum)
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 Patient declined- Bendamustine rituximab ; currently on rituximab  weekly x 4. Hepatitis B/C- Negative. S/p  rituximab  weekly # 4 of planned 4 [last JAN 9th, 20025].  PET scan Feb, 2nd 2026- . Stable intensely hypermetabolic central mesenteric mass measuring 4.3 x 4.4 cm, consistent with known follicular lymphoma;  No new sites of FDG-avid disease in the abdomen or pelvis, and no evidence of osseous metastases  # Given the lack of any significant worsening of the disease or new sites of disease/and overall patient/family preference for conservative management I think is reasonable to continue surveillance.  Will repeat a PET scan again in 6 months  # Elevated hemoglobin 17-no obvious reason noted.  Recommend checking JAK2 and EPO level next visit  # January-2025 [TIA; on asprin Dr.Shah]- currently on asprin- stable.    # Vaccinations prophylactic: s/p  shingles; s/p pneumococcal vaccination.  # Prostate cancer-s/p RP   PSA normal-continue follow-up with urology [Dr.Borden; No fu].march 2024- PSA -WNL-will check PSA   # DISPOSITION:  # 6  months- MD; labs- cbc/cmp; LDH; PSA; PET prior- Dr.B.  # I reviewed the blood work- with the patient in detail; also reviewed the imaging independently [as summarized above]; and with the patient in detail.

## 2024-08-05 ENCOUNTER — Other Ambulatory Visit

## 2024-08-12 ENCOUNTER — Inpatient Hospital Stay

## 2024-08-12 ENCOUNTER — Inpatient Hospital Stay: Admitting: Internal Medicine
